# Patient Record
Sex: Male | Born: 1970 | Race: White | Hispanic: No | Marital: Single | Smoking: Current every day smoker
Health system: Southern US, Community
[De-identification: ages and names within clinical notes are randomized; demographics above are authoritative.]

## PROBLEM LIST (undated history)

## (undated) DIAGNOSIS — R0602 Shortness of breath: Secondary | ICD-10-CM

## (undated) DIAGNOSIS — M797 Fibromyalgia: Secondary | ICD-10-CM

## (undated) DIAGNOSIS — K219 Gastro-esophageal reflux disease without esophagitis: Secondary | ICD-10-CM

## (undated) DIAGNOSIS — R569 Unspecified convulsions: Secondary | ICD-10-CM

## (undated) DIAGNOSIS — F32A Depression, unspecified: Secondary | ICD-10-CM

## (undated) DIAGNOSIS — F329 Major depressive disorder, single episode, unspecified: Secondary | ICD-10-CM

## (undated) DIAGNOSIS — G2581 Restless legs syndrome: Secondary | ICD-10-CM

## (undated) DIAGNOSIS — Z8661 Personal history of infections of the central nervous system: Secondary | ICD-10-CM

## (undated) DIAGNOSIS — F410 Panic disorder [episodic paroxysmal anxiety] without agoraphobia: Secondary | ICD-10-CM

## (undated) DIAGNOSIS — IMO0002 Reserved for concepts with insufficient information to code with codable children: Secondary | ICD-10-CM

## (undated) HISTORY — DX: Unspecified convulsions: R56.9

## (undated) HISTORY — DX: Reserved for concepts with insufficient information to code with codable children: IMO0002

## (undated) HISTORY — DX: Depression, unspecified: F32.A

## (undated) HISTORY — PX: OTHER SURGICAL HISTORY: SHX169

## (undated) HISTORY — DX: Gastro-esophageal reflux disease without esophagitis: K21.9

## (undated) HISTORY — DX: Personal history of infections of the central nervous system: Z86.61

## (undated) HISTORY — DX: Major depressive disorder, single episode, unspecified: F32.9

---

## 1997-12-13 DIAGNOSIS — R569 Unspecified convulsions: Secondary | ICD-10-CM

## 1997-12-13 HISTORY — DX: Unspecified convulsions: R56.9

## 1998-03-29 ENCOUNTER — Ambulatory Visit (HOSPITAL_COMMUNITY): Admission: RE | Admit: 1998-03-29 | Discharge: 1998-03-29 | Payer: Self-pay | Admitting: Neurosurgery

## 1998-04-16 ENCOUNTER — Encounter: Admission: RE | Admit: 1998-04-16 | Discharge: 1998-04-16 | Payer: Self-pay | Admitting: Infectious Diseases

## 1998-08-06 ENCOUNTER — Encounter: Admission: RE | Admit: 1998-08-06 | Discharge: 1998-08-06 | Payer: Self-pay | Admitting: Infectious Diseases

## 1998-09-24 ENCOUNTER — Inpatient Hospital Stay (HOSPITAL_COMMUNITY): Admission: EM | Admit: 1998-09-24 | Discharge: 1998-09-26 | Payer: Self-pay | Admitting: *Deleted

## 1998-09-30 ENCOUNTER — Encounter: Admission: RE | Admit: 1998-09-30 | Discharge: 1998-09-30 | Payer: Self-pay | Admitting: Family Medicine

## 1998-10-09 ENCOUNTER — Encounter: Admission: RE | Admit: 1998-10-09 | Discharge: 1998-10-09 | Payer: Self-pay | Admitting: Family Medicine

## 1998-10-23 ENCOUNTER — Encounter: Admission: RE | Admit: 1998-10-23 | Discharge: 1998-10-23 | Payer: Self-pay | Admitting: Family Medicine

## 1998-10-27 ENCOUNTER — Inpatient Hospital Stay (HOSPITAL_COMMUNITY): Admission: EM | Admit: 1998-10-27 | Discharge: 1998-10-29 | Payer: Self-pay | Admitting: Emergency Medicine

## 1998-10-28 ENCOUNTER — Encounter: Admission: RE | Admit: 1998-10-28 | Discharge: 1998-10-28 | Payer: Self-pay | Admitting: Sports Medicine

## 1998-11-18 ENCOUNTER — Encounter: Admission: RE | Admit: 1998-11-18 | Discharge: 1998-11-18 | Payer: Self-pay | Admitting: Sports Medicine

## 1998-12-13 DIAGNOSIS — Z8661 Personal history of infections of the central nervous system: Secondary | ICD-10-CM

## 1998-12-13 HISTORY — DX: Personal history of infections of the central nervous system: Z86.61

## 1998-12-13 HISTORY — PX: MASTOIDECTOMY: SHX711

## 1998-12-13 HISTORY — PX: BRAIN SURGERY: SHX531

## 1999-05-05 ENCOUNTER — Inpatient Hospital Stay (HOSPITAL_COMMUNITY): Admission: EM | Admit: 1999-05-05 | Discharge: 1999-05-06 | Payer: Self-pay | Admitting: Emergency Medicine

## 1999-05-05 ENCOUNTER — Encounter: Admission: RE | Admit: 1999-05-05 | Discharge: 1999-05-05 | Payer: Self-pay | Admitting: Sports Medicine

## 1999-05-25 ENCOUNTER — Encounter: Admission: RE | Admit: 1999-05-25 | Discharge: 1999-05-25 | Payer: Self-pay | Admitting: Family Medicine

## 1999-06-04 ENCOUNTER — Emergency Department (HOSPITAL_COMMUNITY): Admission: EM | Admit: 1999-06-04 | Discharge: 1999-06-04 | Payer: Self-pay | Admitting: Emergency Medicine

## 1999-06-04 ENCOUNTER — Encounter: Payer: Self-pay | Admitting: Emergency Medicine

## 1999-06-04 ENCOUNTER — Encounter: Admission: RE | Admit: 1999-06-04 | Discharge: 1999-06-04 | Payer: Self-pay | Admitting: Family Medicine

## 1999-06-05 ENCOUNTER — Inpatient Hospital Stay (HOSPITAL_COMMUNITY): Admission: EM | Admit: 1999-06-05 | Discharge: 1999-06-06 | Payer: Self-pay | Admitting: *Deleted

## 1999-10-11 ENCOUNTER — Emergency Department (HOSPITAL_COMMUNITY): Admission: EM | Admit: 1999-10-11 | Discharge: 1999-10-11 | Payer: Self-pay | Admitting: Emergency Medicine

## 2000-11-22 ENCOUNTER — Emergency Department (HOSPITAL_COMMUNITY): Admission: EM | Admit: 2000-11-22 | Discharge: 2000-11-22 | Payer: Self-pay | Admitting: Emergency Medicine

## 2000-11-22 ENCOUNTER — Encounter: Payer: Self-pay | Admitting: Emergency Medicine

## 2000-12-14 ENCOUNTER — Observation Stay (HOSPITAL_COMMUNITY): Admission: EM | Admit: 2000-12-14 | Discharge: 2000-12-14 | Payer: Self-pay | Admitting: Emergency Medicine

## 2000-12-14 ENCOUNTER — Encounter: Payer: Self-pay | Admitting: Emergency Medicine

## 2001-03-25 ENCOUNTER — Emergency Department (HOSPITAL_COMMUNITY): Admission: EM | Admit: 2001-03-25 | Discharge: 2001-03-25 | Payer: Self-pay | Admitting: Emergency Medicine

## 2001-03-25 ENCOUNTER — Encounter: Payer: Self-pay | Admitting: Emergency Medicine

## 2001-04-12 ENCOUNTER — Emergency Department (HOSPITAL_COMMUNITY): Admission: EM | Admit: 2001-04-12 | Discharge: 2001-04-12 | Payer: Self-pay | Admitting: Emergency Medicine

## 2001-05-02 ENCOUNTER — Emergency Department (HOSPITAL_COMMUNITY): Admission: EM | Admit: 2001-05-02 | Discharge: 2001-05-02 | Payer: Self-pay

## 2002-05-11 ENCOUNTER — Emergency Department (HOSPITAL_COMMUNITY): Admission: EM | Admit: 2002-05-11 | Discharge: 2002-05-11 | Payer: Self-pay | Admitting: *Deleted

## 2002-05-19 ENCOUNTER — Encounter: Payer: Self-pay | Admitting: Emergency Medicine

## 2002-05-19 ENCOUNTER — Emergency Department (HOSPITAL_COMMUNITY): Admission: EM | Admit: 2002-05-19 | Discharge: 2002-05-20 | Payer: Self-pay | Admitting: Emergency Medicine

## 2002-05-23 ENCOUNTER — Emergency Department (HOSPITAL_COMMUNITY): Admission: EM | Admit: 2002-05-23 | Discharge: 2002-05-23 | Payer: Self-pay | Admitting: Emergency Medicine

## 2002-05-27 ENCOUNTER — Encounter: Payer: Self-pay | Admitting: Emergency Medicine

## 2002-05-27 ENCOUNTER — Emergency Department (HOSPITAL_COMMUNITY): Admission: EM | Admit: 2002-05-27 | Discharge: 2002-05-27 | Payer: Self-pay

## 2002-06-08 ENCOUNTER — Inpatient Hospital Stay (HOSPITAL_COMMUNITY): Admission: EM | Admit: 2002-06-08 | Discharge: 2002-06-10 | Payer: Self-pay | Admitting: Psychiatry

## 2002-06-12 ENCOUNTER — Emergency Department (HOSPITAL_COMMUNITY): Admission: EM | Admit: 2002-06-12 | Discharge: 2002-06-12 | Payer: Self-pay | Admitting: Emergency Medicine

## 2002-07-04 ENCOUNTER — Encounter: Admission: RE | Admit: 2002-07-04 | Discharge: 2002-09-11 | Payer: Self-pay

## 2002-07-19 ENCOUNTER — Emergency Department (HOSPITAL_COMMUNITY): Admission: EM | Admit: 2002-07-19 | Discharge: 2002-07-19 | Payer: Self-pay | Admitting: Emergency Medicine

## 2002-07-28 ENCOUNTER — Emergency Department (HOSPITAL_COMMUNITY): Admission: EM | Admit: 2002-07-28 | Discharge: 2002-07-28 | Payer: Self-pay | Admitting: Emergency Medicine

## 2002-09-21 ENCOUNTER — Emergency Department (HOSPITAL_COMMUNITY): Admission: EM | Admit: 2002-09-21 | Discharge: 2002-09-21 | Payer: Self-pay | Admitting: Emergency Medicine

## 2002-09-25 ENCOUNTER — Encounter: Admission: RE | Admit: 2002-09-25 | Discharge: 2002-12-24 | Payer: Self-pay

## 2002-12-09 ENCOUNTER — Emergency Department (HOSPITAL_COMMUNITY): Admission: EM | Admit: 2002-12-09 | Discharge: 2002-12-09 | Payer: Self-pay | Admitting: Emergency Medicine

## 2003-01-04 ENCOUNTER — Emergency Department (HOSPITAL_COMMUNITY): Admission: EM | Admit: 2003-01-04 | Discharge: 2003-01-04 | Payer: Self-pay | Admitting: Emergency Medicine

## 2003-01-25 ENCOUNTER — Emergency Department (HOSPITAL_COMMUNITY): Admission: EM | Admit: 2003-01-25 | Discharge: 2003-01-25 | Payer: Self-pay | Admitting: Emergency Medicine

## 2003-06-02 ENCOUNTER — Emergency Department (HOSPITAL_COMMUNITY): Admission: EM | Admit: 2003-06-02 | Discharge: 2003-06-03 | Payer: Self-pay | Admitting: Emergency Medicine

## 2003-06-03 ENCOUNTER — Encounter: Payer: Self-pay | Admitting: Emergency Medicine

## 2003-06-05 ENCOUNTER — Encounter: Payer: Self-pay | Admitting: Emergency Medicine

## 2003-06-05 ENCOUNTER — Emergency Department (HOSPITAL_COMMUNITY): Admission: EM | Admit: 2003-06-05 | Discharge: 2003-06-05 | Payer: Self-pay | Admitting: Emergency Medicine

## 2003-06-06 ENCOUNTER — Emergency Department (HOSPITAL_COMMUNITY): Admission: EM | Admit: 2003-06-06 | Discharge: 2003-06-07 | Payer: Self-pay | Admitting: Emergency Medicine

## 2003-06-07 ENCOUNTER — Encounter: Payer: Self-pay | Admitting: Emergency Medicine

## 2003-06-11 ENCOUNTER — Encounter: Payer: Self-pay | Admitting: Emergency Medicine

## 2003-06-11 ENCOUNTER — Emergency Department (HOSPITAL_COMMUNITY): Admission: EM | Admit: 2003-06-11 | Discharge: 2003-06-11 | Payer: Self-pay | Admitting: Emergency Medicine

## 2003-06-20 ENCOUNTER — Emergency Department (HOSPITAL_COMMUNITY): Admission: EM | Admit: 2003-06-20 | Discharge: 2003-06-20 | Payer: Self-pay | Admitting: Emergency Medicine

## 2003-07-18 ENCOUNTER — Emergency Department (HOSPITAL_COMMUNITY): Admission: EM | Admit: 2003-07-18 | Discharge: 2003-07-18 | Payer: Self-pay | Admitting: Emergency Medicine

## 2005-02-08 ENCOUNTER — Emergency Department (HOSPITAL_COMMUNITY): Admission: EM | Admit: 2005-02-08 | Discharge: 2005-02-08 | Payer: Self-pay | Admitting: Emergency Medicine

## 2005-06-16 ENCOUNTER — Emergency Department (HOSPITAL_COMMUNITY): Admission: EM | Admit: 2005-06-16 | Discharge: 2005-06-16 | Payer: Self-pay | Admitting: Emergency Medicine

## 2005-07-09 ENCOUNTER — Emergency Department (HOSPITAL_COMMUNITY): Admission: EM | Admit: 2005-07-09 | Discharge: 2005-07-09 | Payer: Self-pay | Admitting: Family Medicine

## 2005-07-26 ENCOUNTER — Emergency Department (HOSPITAL_COMMUNITY): Admission: EM | Admit: 2005-07-26 | Discharge: 2005-07-26 | Payer: Self-pay | Admitting: Emergency Medicine

## 2006-05-27 ENCOUNTER — Emergency Department (HOSPITAL_COMMUNITY): Admission: EM | Admit: 2006-05-27 | Discharge: 2006-05-27 | Payer: Self-pay | Admitting: Emergency Medicine

## 2006-05-28 ENCOUNTER — Emergency Department (HOSPITAL_COMMUNITY): Admission: EM | Admit: 2006-05-28 | Discharge: 2006-05-28 | Payer: Self-pay | Admitting: Emergency Medicine

## 2006-07-17 ENCOUNTER — Emergency Department (HOSPITAL_COMMUNITY): Admission: EM | Admit: 2006-07-17 | Discharge: 2006-07-17 | Payer: Self-pay | Admitting: *Deleted

## 2006-07-25 ENCOUNTER — Emergency Department (HOSPITAL_COMMUNITY): Admission: EM | Admit: 2006-07-25 | Discharge: 2006-07-25 | Payer: Self-pay | Admitting: Emergency Medicine

## 2006-08-05 ENCOUNTER — Emergency Department (HOSPITAL_COMMUNITY): Admission: EM | Admit: 2006-08-05 | Discharge: 2006-08-05 | Payer: Self-pay | Admitting: Emergency Medicine

## 2006-08-15 ENCOUNTER — Emergency Department (HOSPITAL_COMMUNITY): Admission: EM | Admit: 2006-08-15 | Discharge: 2006-08-15 | Payer: Self-pay | Admitting: Emergency Medicine

## 2006-08-29 ENCOUNTER — Emergency Department (HOSPITAL_COMMUNITY): Admission: EM | Admit: 2006-08-29 | Discharge: 2006-08-29 | Payer: Self-pay | Admitting: Emergency Medicine

## 2006-09-05 ENCOUNTER — Emergency Department (HOSPITAL_COMMUNITY): Admission: EM | Admit: 2006-09-05 | Discharge: 2006-09-05 | Payer: Self-pay | Admitting: Emergency Medicine

## 2006-09-12 ENCOUNTER — Emergency Department (HOSPITAL_COMMUNITY): Admission: EM | Admit: 2006-09-12 | Discharge: 2006-09-12 | Payer: Self-pay | Admitting: Family Medicine

## 2006-09-12 ENCOUNTER — Emergency Department (HOSPITAL_COMMUNITY): Admission: EM | Admit: 2006-09-12 | Discharge: 2006-09-12 | Payer: Self-pay | Admitting: Emergency Medicine

## 2006-09-21 ENCOUNTER — Emergency Department (HOSPITAL_COMMUNITY): Admission: EM | Admit: 2006-09-21 | Discharge: 2006-09-21 | Payer: Self-pay | Admitting: Emergency Medicine

## 2006-09-28 ENCOUNTER — Emergency Department (HOSPITAL_COMMUNITY): Admission: EM | Admit: 2006-09-28 | Discharge: 2006-09-28 | Payer: Self-pay | Admitting: Emergency Medicine

## 2006-10-01 ENCOUNTER — Emergency Department (HOSPITAL_COMMUNITY): Admission: EM | Admit: 2006-10-01 | Discharge: 2006-10-01 | Payer: Self-pay | Admitting: Emergency Medicine

## 2006-10-04 ENCOUNTER — Encounter: Admission: RE | Admit: 2006-10-04 | Discharge: 2006-10-04 | Payer: Self-pay | Admitting: Family Medicine

## 2006-11-24 ENCOUNTER — Emergency Department (HOSPITAL_COMMUNITY): Admission: EM | Admit: 2006-11-24 | Discharge: 2006-11-24 | Payer: Self-pay | Admitting: Emergency Medicine

## 2006-11-24 ENCOUNTER — Emergency Department (HOSPITAL_COMMUNITY): Admission: EM | Admit: 2006-11-24 | Discharge: 2006-11-25 | Payer: Self-pay | Admitting: Emergency Medicine

## 2006-11-25 ENCOUNTER — Emergency Department (HOSPITAL_COMMUNITY): Admission: EM | Admit: 2006-11-25 | Discharge: 2006-11-25 | Payer: Self-pay | Admitting: Emergency Medicine

## 2006-12-08 ENCOUNTER — Emergency Department (HOSPITAL_COMMUNITY): Admission: EM | Admit: 2006-12-08 | Discharge: 2006-12-08 | Payer: Self-pay | Admitting: Emergency Medicine

## 2006-12-11 ENCOUNTER — Emergency Department (HOSPITAL_COMMUNITY): Admission: EM | Admit: 2006-12-11 | Discharge: 2006-12-11 | Payer: Self-pay | Admitting: Emergency Medicine

## 2006-12-29 ENCOUNTER — Emergency Department (HOSPITAL_COMMUNITY): Admission: EM | Admit: 2006-12-29 | Discharge: 2006-12-29 | Payer: Self-pay | Admitting: Emergency Medicine

## 2007-01-08 ENCOUNTER — Emergency Department (HOSPITAL_COMMUNITY): Admission: EM | Admit: 2007-01-08 | Discharge: 2007-01-08 | Payer: Self-pay | Admitting: Emergency Medicine

## 2007-01-11 ENCOUNTER — Emergency Department (HOSPITAL_COMMUNITY): Admission: EM | Admit: 2007-01-11 | Discharge: 2007-01-11 | Payer: Self-pay | Admitting: Emergency Medicine

## 2007-01-26 ENCOUNTER — Emergency Department (HOSPITAL_COMMUNITY): Admission: EM | Admit: 2007-01-26 | Discharge: 2007-01-26 | Payer: Self-pay | Admitting: Emergency Medicine

## 2007-01-28 ENCOUNTER — Emergency Department (HOSPITAL_COMMUNITY): Admission: EM | Admit: 2007-01-28 | Discharge: 2007-01-28 | Payer: Self-pay | Admitting: Emergency Medicine

## 2007-01-29 ENCOUNTER — Emergency Department (HOSPITAL_COMMUNITY): Admission: EM | Admit: 2007-01-29 | Discharge: 2007-01-29 | Payer: Self-pay | Admitting: Emergency Medicine

## 2007-02-09 ENCOUNTER — Emergency Department (HOSPITAL_COMMUNITY): Admission: EM | Admit: 2007-02-09 | Discharge: 2007-02-09 | Payer: Self-pay | Admitting: Emergency Medicine

## 2007-02-23 DIAGNOSIS — R569 Unspecified convulsions: Secondary | ICD-10-CM | POA: Insufficient documentation

## 2007-02-23 DIAGNOSIS — L02419 Cutaneous abscess of limb, unspecified: Secondary | ICD-10-CM | POA: Insufficient documentation

## 2007-02-23 DIAGNOSIS — L03119 Cellulitis of unspecified part of limb: Secondary | ICD-10-CM

## 2007-02-23 DIAGNOSIS — F411 Generalized anxiety disorder: Secondary | ICD-10-CM | POA: Insufficient documentation

## 2007-02-23 DIAGNOSIS — Z8669 Personal history of other diseases of the nervous system and sense organs: Secondary | ICD-10-CM

## 2007-02-23 DIAGNOSIS — B957 Other staphylococcus as the cause of diseases classified elsewhere: Secondary | ICD-10-CM | POA: Insufficient documentation

## 2007-02-26 ENCOUNTER — Emergency Department (HOSPITAL_COMMUNITY): Admission: EM | Admit: 2007-02-26 | Discharge: 2007-02-26 | Payer: Self-pay | Admitting: Emergency Medicine

## 2007-02-28 ENCOUNTER — Ambulatory Visit: Payer: Self-pay | Admitting: Infectious Diseases

## 2007-02-28 DIAGNOSIS — F988 Other specified behavioral and emotional disorders with onset usually occurring in childhood and adolescence: Secondary | ICD-10-CM | POA: Insufficient documentation

## 2007-03-07 ENCOUNTER — Telehealth (INDEPENDENT_AMBULATORY_CARE_PROVIDER_SITE_OTHER): Payer: Self-pay | Admitting: Infectious Diseases

## 2007-03-30 ENCOUNTER — Emergency Department (HOSPITAL_COMMUNITY): Admission: EM | Admit: 2007-03-30 | Discharge: 2007-03-30 | Payer: Self-pay | Admitting: Emergency Medicine

## 2007-04-22 ENCOUNTER — Emergency Department (HOSPITAL_COMMUNITY): Admission: EM | Admit: 2007-04-22 | Discharge: 2007-04-23 | Payer: Self-pay | Admitting: Family Medicine

## 2007-04-23 ENCOUNTER — Emergency Department (HOSPITAL_COMMUNITY): Admission: EM | Admit: 2007-04-23 | Discharge: 2007-04-23 | Payer: Self-pay | Admitting: Emergency Medicine

## 2007-04-24 ENCOUNTER — Emergency Department (HOSPITAL_COMMUNITY): Admission: EM | Admit: 2007-04-24 | Discharge: 2007-04-24 | Payer: Self-pay | Admitting: Emergency Medicine

## 2007-04-27 ENCOUNTER — Emergency Department (HOSPITAL_COMMUNITY): Admission: EM | Admit: 2007-04-27 | Discharge: 2007-04-27 | Payer: Self-pay | Admitting: Emergency Medicine

## 2007-05-29 ENCOUNTER — Emergency Department (HOSPITAL_COMMUNITY): Admission: EM | Admit: 2007-05-29 | Discharge: 2007-05-29 | Payer: Self-pay | Admitting: Emergency Medicine

## 2007-06-09 ENCOUNTER — Emergency Department (HOSPITAL_COMMUNITY): Admission: EM | Admit: 2007-06-09 | Discharge: 2007-06-09 | Payer: Self-pay | Admitting: Emergency Medicine

## 2007-06-17 ENCOUNTER — Emergency Department (HOSPITAL_COMMUNITY): Admission: EM | Admit: 2007-06-17 | Discharge: 2007-06-17 | Payer: Self-pay | Admitting: Emergency Medicine

## 2007-08-01 ENCOUNTER — Emergency Department (HOSPITAL_COMMUNITY): Admission: EM | Admit: 2007-08-01 | Discharge: 2007-08-02 | Payer: Self-pay | Admitting: Emergency Medicine

## 2007-08-06 ENCOUNTER — Emergency Department (HOSPITAL_COMMUNITY): Admission: EM | Admit: 2007-08-06 | Discharge: 2007-08-06 | Payer: Self-pay | Admitting: Emergency Medicine

## 2007-08-07 ENCOUNTER — Emergency Department (HOSPITAL_COMMUNITY): Admission: EM | Admit: 2007-08-07 | Discharge: 2007-08-07 | Payer: Self-pay | Admitting: Family Medicine

## 2007-08-09 ENCOUNTER — Telehealth: Payer: Self-pay | Admitting: Internal Medicine

## 2007-08-11 ENCOUNTER — Telehealth: Payer: Self-pay

## 2007-08-14 ENCOUNTER — Emergency Department (HOSPITAL_COMMUNITY): Admission: EM | Admit: 2007-08-14 | Discharge: 2007-08-14 | Payer: Self-pay | Admitting: Emergency Medicine

## 2007-09-11 ENCOUNTER — Emergency Department (HOSPITAL_COMMUNITY): Admission: EM | Admit: 2007-09-11 | Discharge: 2007-09-11 | Payer: Self-pay | Admitting: Emergency Medicine

## 2007-09-27 ENCOUNTER — Emergency Department (HOSPITAL_COMMUNITY): Admission: EM | Admit: 2007-09-27 | Discharge: 2007-09-27 | Payer: Self-pay | Admitting: Emergency Medicine

## 2007-10-01 ENCOUNTER — Emergency Department (HOSPITAL_COMMUNITY): Admission: EM | Admit: 2007-10-01 | Discharge: 2007-10-01 | Payer: Self-pay | Admitting: *Deleted

## 2007-10-12 ENCOUNTER — Encounter: Admission: RE | Admit: 2007-10-12 | Discharge: 2007-11-15 | Payer: Self-pay | Admitting: Anesthesiology

## 2007-11-28 ENCOUNTER — Emergency Department (HOSPITAL_COMMUNITY): Admission: EM | Admit: 2007-11-28 | Discharge: 2007-11-28 | Payer: Self-pay | Admitting: Emergency Medicine

## 2007-12-10 ENCOUNTER — Emergency Department (HOSPITAL_COMMUNITY): Admission: EM | Admit: 2007-12-10 | Discharge: 2007-12-10 | Payer: Self-pay | Admitting: Emergency Medicine

## 2008-01-02 ENCOUNTER — Emergency Department (HOSPITAL_COMMUNITY): Admission: EM | Admit: 2008-01-02 | Discharge: 2008-01-02 | Payer: Self-pay | Admitting: Emergency Medicine

## 2008-01-04 ENCOUNTER — Emergency Department (HOSPITAL_COMMUNITY): Admission: EM | Admit: 2008-01-04 | Discharge: 2008-01-04 | Payer: Self-pay | Admitting: Emergency Medicine

## 2008-01-26 ENCOUNTER — Emergency Department (HOSPITAL_COMMUNITY): Admission: EM | Admit: 2008-01-26 | Discharge: 2008-01-26 | Payer: Self-pay | Admitting: Emergency Medicine

## 2008-01-26 ENCOUNTER — Other Ambulatory Visit: Payer: Self-pay | Admitting: Emergency Medicine

## 2008-01-29 ENCOUNTER — Emergency Department (HOSPITAL_COMMUNITY): Admission: EM | Admit: 2008-01-29 | Discharge: 2008-01-29 | Payer: Self-pay | Admitting: Emergency Medicine

## 2008-02-20 ENCOUNTER — Emergency Department (HOSPITAL_COMMUNITY): Admission: EM | Admit: 2008-02-20 | Discharge: 2008-02-20 | Payer: Self-pay | Admitting: Emergency Medicine

## 2008-02-22 ENCOUNTER — Emergency Department (HOSPITAL_COMMUNITY): Admission: EM | Admit: 2008-02-22 | Discharge: 2008-02-22 | Payer: Self-pay | Admitting: Emergency Medicine

## 2008-02-23 ENCOUNTER — Emergency Department (HOSPITAL_COMMUNITY): Admission: EM | Admit: 2008-02-23 | Discharge: 2008-02-23 | Payer: Self-pay | Admitting: Emergency Medicine

## 2008-02-27 ENCOUNTER — Emergency Department (HOSPITAL_COMMUNITY): Admission: EM | Admit: 2008-02-27 | Discharge: 2008-02-27 | Payer: Self-pay | Admitting: Family Medicine

## 2008-02-27 ENCOUNTER — Emergency Department (HOSPITAL_COMMUNITY): Admission: EM | Admit: 2008-02-27 | Discharge: 2008-02-27 | Payer: Self-pay | Admitting: Emergency Medicine

## 2008-03-03 ENCOUNTER — Emergency Department (HOSPITAL_COMMUNITY): Admission: EM | Admit: 2008-03-03 | Discharge: 2008-03-03 | Payer: Self-pay | Admitting: Emergency Medicine

## 2008-03-04 ENCOUNTER — Encounter (INDEPENDENT_AMBULATORY_CARE_PROVIDER_SITE_OTHER): Payer: Self-pay | Admitting: *Deleted

## 2008-03-05 ENCOUNTER — Emergency Department (HOSPITAL_COMMUNITY): Admission: EM | Admit: 2008-03-05 | Discharge: 2008-03-05 | Payer: Self-pay | Admitting: Emergency Medicine

## 2008-03-07 ENCOUNTER — Emergency Department (HOSPITAL_COMMUNITY): Admission: EM | Admit: 2008-03-07 | Discharge: 2008-03-07 | Payer: Self-pay | Admitting: Emergency Medicine

## 2008-03-19 ENCOUNTER — Emergency Department (HOSPITAL_COMMUNITY): Admission: EM | Admit: 2008-03-19 | Discharge: 2008-03-19 | Payer: Self-pay | Admitting: Emergency Medicine

## 2008-05-11 ENCOUNTER — Emergency Department (HOSPITAL_COMMUNITY): Admission: EM | Admit: 2008-05-11 | Discharge: 2008-05-11 | Payer: Self-pay | Admitting: Emergency Medicine

## 2008-05-22 ENCOUNTER — Emergency Department (HOSPITAL_COMMUNITY): Admission: EM | Admit: 2008-05-22 | Discharge: 2008-05-22 | Payer: Self-pay | Admitting: Emergency Medicine

## 2008-06-05 ENCOUNTER — Ambulatory Visit (HOSPITAL_COMMUNITY): Payer: Self-pay | Admitting: Psychiatry

## 2008-06-10 ENCOUNTER — Emergency Department (HOSPITAL_COMMUNITY): Admission: EM | Admit: 2008-06-10 | Discharge: 2008-06-10 | Payer: Self-pay | Admitting: Emergency Medicine

## 2008-06-17 ENCOUNTER — Emergency Department (HOSPITAL_COMMUNITY): Admission: EM | Admit: 2008-06-17 | Discharge: 2008-06-18 | Payer: Self-pay | Admitting: Emergency Medicine

## 2008-06-28 ENCOUNTER — Ambulatory Visit (HOSPITAL_COMMUNITY): Payer: Self-pay | Admitting: Psychiatry

## 2008-07-01 ENCOUNTER — Ambulatory Visit (HOSPITAL_COMMUNITY): Payer: Self-pay | Admitting: Psychiatry

## 2008-07-03 ENCOUNTER — Emergency Department (HOSPITAL_COMMUNITY): Admission: EM | Admit: 2008-07-03 | Discharge: 2008-07-03 | Payer: Self-pay | Admitting: Emergency Medicine

## 2008-07-06 ENCOUNTER — Emergency Department (HOSPITAL_COMMUNITY): Admission: EM | Admit: 2008-07-06 | Discharge: 2008-07-06 | Payer: Self-pay | Admitting: Emergency Medicine

## 2008-07-21 ENCOUNTER — Emergency Department (HOSPITAL_COMMUNITY): Admission: EM | Admit: 2008-07-21 | Discharge: 2008-07-21 | Payer: Self-pay | Admitting: Emergency Medicine

## 2008-07-21 ENCOUNTER — Emergency Department (HOSPITAL_COMMUNITY): Admission: EM | Admit: 2008-07-21 | Discharge: 2008-07-21 | Payer: Self-pay | Admitting: Family Medicine

## 2008-07-29 ENCOUNTER — Emergency Department (HOSPITAL_COMMUNITY): Admission: EM | Admit: 2008-07-29 | Discharge: 2008-07-29 | Payer: Self-pay | Admitting: Emergency Medicine

## 2008-07-30 ENCOUNTER — Emergency Department (HOSPITAL_COMMUNITY): Admission: EM | Admit: 2008-07-30 | Discharge: 2008-07-30 | Payer: Self-pay | Admitting: Emergency Medicine

## 2008-12-10 ENCOUNTER — Emergency Department (HOSPITAL_COMMUNITY): Admission: EM | Admit: 2008-12-10 | Discharge: 2008-12-10 | Payer: Self-pay | Admitting: Emergency Medicine

## 2008-12-17 ENCOUNTER — Emergency Department (HOSPITAL_COMMUNITY): Admission: EM | Admit: 2008-12-17 | Discharge: 2008-12-17 | Payer: Self-pay | Admitting: Emergency Medicine

## 2008-12-23 ENCOUNTER — Emergency Department (HOSPITAL_COMMUNITY): Admission: EM | Admit: 2008-12-23 | Discharge: 2008-12-23 | Payer: Self-pay | Admitting: Emergency Medicine

## 2008-12-24 ENCOUNTER — Emergency Department (HOSPITAL_COMMUNITY): Admission: EM | Admit: 2008-12-24 | Discharge: 2008-12-24 | Payer: Self-pay | Admitting: Emergency Medicine

## 2008-12-28 ENCOUNTER — Emergency Department (HOSPITAL_COMMUNITY): Admission: EM | Admit: 2008-12-28 | Discharge: 2008-12-28 | Payer: Self-pay | Admitting: Emergency Medicine

## 2008-12-29 ENCOUNTER — Emergency Department (HOSPITAL_COMMUNITY): Admission: EM | Admit: 2008-12-29 | Discharge: 2008-12-29 | Payer: Self-pay | Admitting: Emergency Medicine

## 2009-01-13 ENCOUNTER — Emergency Department (HOSPITAL_COMMUNITY): Admission: EM | Admit: 2009-01-13 | Discharge: 2009-01-13 | Payer: Self-pay | Admitting: Emergency Medicine

## 2009-02-19 ENCOUNTER — Emergency Department (HOSPITAL_COMMUNITY): Admission: EM | Admit: 2009-02-19 | Discharge: 2009-02-19 | Payer: Self-pay | Admitting: Emergency Medicine

## 2009-03-01 ENCOUNTER — Emergency Department (HOSPITAL_COMMUNITY): Admission: EM | Admit: 2009-03-01 | Discharge: 2009-03-01 | Payer: Self-pay | Admitting: Emergency Medicine

## 2009-03-04 ENCOUNTER — Emergency Department (HOSPITAL_COMMUNITY): Admission: EM | Admit: 2009-03-04 | Discharge: 2009-03-04 | Payer: Self-pay | Admitting: Emergency Medicine

## 2009-04-14 ENCOUNTER — Emergency Department (HOSPITAL_COMMUNITY): Admission: EM | Admit: 2009-04-14 | Discharge: 2009-04-14 | Payer: Self-pay | Admitting: Emergency Medicine

## 2009-05-24 ENCOUNTER — Emergency Department (HOSPITAL_COMMUNITY): Admission: EM | Admit: 2009-05-24 | Discharge: 2009-05-24 | Payer: Self-pay | Admitting: Emergency Medicine

## 2009-05-29 ENCOUNTER — Emergency Department (HOSPITAL_COMMUNITY): Admission: EM | Admit: 2009-05-29 | Discharge: 2009-05-29 | Payer: Self-pay | Admitting: Emergency Medicine

## 2009-06-04 ENCOUNTER — Emergency Department (HOSPITAL_COMMUNITY): Admission: EM | Admit: 2009-06-04 | Discharge: 2009-06-04 | Payer: Self-pay | Admitting: Emergency Medicine

## 2009-06-05 ENCOUNTER — Telehealth (INDEPENDENT_AMBULATORY_CARE_PROVIDER_SITE_OTHER): Payer: Self-pay | Admitting: Licensed Clinical Social Worker

## 2009-06-20 ENCOUNTER — Emergency Department (HOSPITAL_COMMUNITY): Admission: EM | Admit: 2009-06-20 | Discharge: 2009-06-20 | Payer: Self-pay | Admitting: Emergency Medicine

## 2009-06-24 ENCOUNTER — Emergency Department (HOSPITAL_COMMUNITY): Admission: EM | Admit: 2009-06-24 | Discharge: 2009-06-24 | Payer: Self-pay | Admitting: Emergency Medicine

## 2009-06-29 ENCOUNTER — Emergency Department (HOSPITAL_COMMUNITY): Admission: EM | Admit: 2009-06-29 | Discharge: 2009-06-29 | Payer: Self-pay | Admitting: Emergency Medicine

## 2009-07-10 ENCOUNTER — Emergency Department (HOSPITAL_COMMUNITY): Admission: EM | Admit: 2009-07-10 | Discharge: 2009-07-10 | Payer: Self-pay | Admitting: Emergency Medicine

## 2009-07-14 ENCOUNTER — Other Ambulatory Visit: Payer: Self-pay | Admitting: Emergency Medicine

## 2009-07-14 ENCOUNTER — Inpatient Hospital Stay (HOSPITAL_COMMUNITY): Admission: RE | Admit: 2009-07-14 | Discharge: 2009-07-15 | Payer: Self-pay | Admitting: *Deleted

## 2009-07-14 ENCOUNTER — Ambulatory Visit: Payer: Self-pay | Admitting: Psychiatry

## 2009-07-16 ENCOUNTER — Emergency Department (HOSPITAL_COMMUNITY): Admission: EM | Admit: 2009-07-16 | Discharge: 2009-07-16 | Payer: Self-pay | Admitting: Emergency Medicine

## 2009-07-16 ENCOUNTER — Encounter: Payer: Self-pay | Admitting: Internal Medicine

## 2009-07-16 LAB — CONVERTED CEMR LAB
Calcium: 9.7 mg/dL
Chloride: 110 meq/L
Glucose, Bld: 128 mg/dL
Hemoglobin: 15.4 g/dL
Lymphocytes, automated: 29 %
MCV: 96.5 fL
Monocytes Relative: 8 %
Potassium: 3.7 meq/L
RDW: 14 %

## 2009-08-08 ENCOUNTER — Emergency Department (HOSPITAL_COMMUNITY): Admission: EM | Admit: 2009-08-08 | Discharge: 2009-08-08 | Payer: Self-pay | Admitting: Emergency Medicine

## 2009-08-09 ENCOUNTER — Emergency Department (HOSPITAL_COMMUNITY): Admission: EM | Admit: 2009-08-09 | Discharge: 2009-08-09 | Payer: Self-pay | Admitting: Emergency Medicine

## 2009-08-21 ENCOUNTER — Emergency Department (HOSPITAL_COMMUNITY): Admission: EM | Admit: 2009-08-21 | Discharge: 2009-08-21 | Payer: Self-pay | Admitting: Family Medicine

## 2009-08-24 ENCOUNTER — Emergency Department (HOSPITAL_COMMUNITY): Admission: EM | Admit: 2009-08-24 | Discharge: 2009-08-24 | Payer: Self-pay | Admitting: Emergency Medicine

## 2009-09-09 ENCOUNTER — Emergency Department (HOSPITAL_COMMUNITY): Admission: EM | Admit: 2009-09-09 | Discharge: 2009-09-09 | Payer: Self-pay | Admitting: Emergency Medicine

## 2009-09-11 ENCOUNTER — Emergency Department (HOSPITAL_COMMUNITY): Admission: EM | Admit: 2009-09-11 | Discharge: 2009-09-11 | Payer: Self-pay | Admitting: Emergency Medicine

## 2009-09-11 ENCOUNTER — Emergency Department (HOSPITAL_COMMUNITY): Admission: EM | Admit: 2009-09-11 | Discharge: 2009-09-11 | Payer: Self-pay | Admitting: Family Medicine

## 2009-09-15 ENCOUNTER — Emergency Department (HOSPITAL_COMMUNITY): Admission: EM | Admit: 2009-09-15 | Discharge: 2009-09-15 | Payer: Self-pay | Admitting: Emergency Medicine

## 2009-09-26 ENCOUNTER — Emergency Department (HOSPITAL_COMMUNITY): Admission: EM | Admit: 2009-09-26 | Discharge: 2009-09-26 | Payer: Self-pay | Admitting: Emergency Medicine

## 2009-09-29 ENCOUNTER — Emergency Department (HOSPITAL_COMMUNITY): Admission: EM | Admit: 2009-09-29 | Discharge: 2009-09-29 | Payer: Self-pay | Admitting: Emergency Medicine

## 2009-09-30 ENCOUNTER — Emergency Department (HOSPITAL_COMMUNITY): Admission: EM | Admit: 2009-09-30 | Discharge: 2009-09-30 | Payer: Self-pay | Admitting: Emergency Medicine

## 2009-09-30 ENCOUNTER — Emergency Department (HOSPITAL_COMMUNITY): Admission: EM | Admit: 2009-09-30 | Discharge: 2009-09-30 | Payer: Self-pay | Admitting: Family Medicine

## 2009-10-09 ENCOUNTER — Emergency Department (HOSPITAL_COMMUNITY): Admission: EM | Admit: 2009-10-09 | Discharge: 2009-10-09 | Payer: Self-pay | Admitting: Emergency Medicine

## 2009-10-12 ENCOUNTER — Emergency Department (HOSPITAL_COMMUNITY): Admission: EM | Admit: 2009-10-12 | Discharge: 2009-10-12 | Payer: Self-pay | Admitting: Emergency Medicine

## 2009-10-13 ENCOUNTER — Encounter: Payer: Self-pay | Admitting: Internal Medicine

## 2009-10-13 DIAGNOSIS — F17200 Nicotine dependence, unspecified, uncomplicated: Secondary | ICD-10-CM | POA: Insufficient documentation

## 2009-10-13 DIAGNOSIS — IMO0001 Reserved for inherently not codable concepts without codable children: Secondary | ICD-10-CM

## 2009-10-14 ENCOUNTER — Ambulatory Visit: Payer: Self-pay | Admitting: Internal Medicine

## 2009-10-28 ENCOUNTER — Telehealth: Payer: Self-pay | Admitting: Internal Medicine

## 2009-10-31 ENCOUNTER — Emergency Department (HOSPITAL_COMMUNITY): Admission: EM | Admit: 2009-10-31 | Discharge: 2009-10-31 | Payer: Self-pay | Admitting: Emergency Medicine

## 2009-11-02 ENCOUNTER — Emergency Department (HOSPITAL_COMMUNITY): Admission: EM | Admit: 2009-11-02 | Discharge: 2009-11-02 | Payer: Self-pay | Admitting: Emergency Medicine

## 2009-11-05 ENCOUNTER — Emergency Department (HOSPITAL_COMMUNITY): Admission: EM | Admit: 2009-11-05 | Discharge: 2009-11-05 | Payer: Self-pay | Admitting: Emergency Medicine

## 2009-11-09 ENCOUNTER — Emergency Department (HOSPITAL_COMMUNITY): Admission: EM | Admit: 2009-11-09 | Discharge: 2009-11-09 | Payer: Self-pay | Admitting: Emergency Medicine

## 2009-11-25 ENCOUNTER — Emergency Department (HOSPITAL_COMMUNITY): Admission: EM | Admit: 2009-11-25 | Discharge: 2009-11-25 | Payer: Self-pay | Admitting: Emergency Medicine

## 2009-11-26 ENCOUNTER — Emergency Department (HOSPITAL_COMMUNITY): Admission: EM | Admit: 2009-11-26 | Discharge: 2009-11-26 | Payer: Self-pay | Admitting: Emergency Medicine

## 2009-11-28 ENCOUNTER — Emergency Department (HOSPITAL_COMMUNITY): Admission: EM | Admit: 2009-11-28 | Discharge: 2009-11-28 | Payer: Self-pay | Admitting: Emergency Medicine

## 2009-12-01 ENCOUNTER — Emergency Department (HOSPITAL_COMMUNITY): Admission: EM | Admit: 2009-12-01 | Discharge: 2009-12-01 | Payer: Self-pay | Admitting: Emergency Medicine

## 2009-12-16 ENCOUNTER — Emergency Department (HOSPITAL_COMMUNITY): Admission: EM | Admit: 2009-12-16 | Discharge: 2009-12-16 | Payer: Self-pay | Admitting: Emergency Medicine

## 2009-12-24 ENCOUNTER — Emergency Department (HOSPITAL_COMMUNITY): Admission: EM | Admit: 2009-12-24 | Discharge: 2009-12-24 | Payer: Self-pay | Admitting: Emergency Medicine

## 2010-01-10 ENCOUNTER — Emergency Department (HOSPITAL_COMMUNITY): Admission: EM | Admit: 2010-01-10 | Discharge: 2010-01-10 | Payer: Self-pay | Admitting: Emergency Medicine

## 2010-02-12 ENCOUNTER — Emergency Department (HOSPITAL_COMMUNITY): Admission: EM | Admit: 2010-02-12 | Discharge: 2010-02-12 | Payer: Self-pay | Admitting: Emergency Medicine

## 2010-02-15 ENCOUNTER — Emergency Department (HOSPITAL_COMMUNITY): Admission: EM | Admit: 2010-02-15 | Discharge: 2010-02-15 | Payer: Self-pay | Admitting: Emergency Medicine

## 2010-02-16 ENCOUNTER — Emergency Department (HOSPITAL_COMMUNITY): Admission: EM | Admit: 2010-02-16 | Discharge: 2010-02-16 | Payer: Self-pay | Admitting: Emergency Medicine

## 2010-04-12 ENCOUNTER — Emergency Department (HOSPITAL_COMMUNITY): Admission: EM | Admit: 2010-04-12 | Discharge: 2010-04-12 | Payer: Self-pay | Admitting: Emergency Medicine

## 2010-04-13 ENCOUNTER — Emergency Department (HOSPITAL_COMMUNITY): Admission: EM | Admit: 2010-04-13 | Discharge: 2010-04-13 | Payer: Self-pay | Admitting: Emergency Medicine

## 2010-04-14 ENCOUNTER — Telehealth: Payer: Self-pay | Admitting: Internal Medicine

## 2010-04-16 ENCOUNTER — Emergency Department (HOSPITAL_COMMUNITY): Admission: EM | Admit: 2010-04-16 | Discharge: 2010-04-16 | Payer: Self-pay | Admitting: Emergency Medicine

## 2010-04-18 ENCOUNTER — Emergency Department (HOSPITAL_COMMUNITY): Admission: EM | Admit: 2010-04-18 | Discharge: 2010-04-18 | Payer: Self-pay | Admitting: Emergency Medicine

## 2010-05-02 ENCOUNTER — Emergency Department (HOSPITAL_COMMUNITY): Admission: EM | Admit: 2010-05-02 | Discharge: 2010-05-02 | Payer: Self-pay | Admitting: Emergency Medicine

## 2010-05-07 ENCOUNTER — Emergency Department (HOSPITAL_COMMUNITY): Admission: EM | Admit: 2010-05-07 | Discharge: 2010-05-07 | Payer: Self-pay | Admitting: Emergency Medicine

## 2010-05-16 ENCOUNTER — Emergency Department (HOSPITAL_COMMUNITY): Admission: EM | Admit: 2010-05-16 | Discharge: 2010-05-16 | Payer: Self-pay | Admitting: Emergency Medicine

## 2010-05-19 ENCOUNTER — Emergency Department (HOSPITAL_COMMUNITY): Admission: EM | Admit: 2010-05-19 | Discharge: 2010-05-19 | Payer: Self-pay | Admitting: Emergency Medicine

## 2010-05-31 ENCOUNTER — Emergency Department (HOSPITAL_COMMUNITY): Admission: EM | Admit: 2010-05-31 | Discharge: 2010-05-31 | Payer: Self-pay | Admitting: Emergency Medicine

## 2010-06-14 ENCOUNTER — Emergency Department (HOSPITAL_COMMUNITY): Admission: EM | Admit: 2010-06-14 | Discharge: 2010-06-14 | Payer: Self-pay | Admitting: Emergency Medicine

## 2010-06-24 ENCOUNTER — Emergency Department (HOSPITAL_COMMUNITY): Admission: EM | Admit: 2010-06-24 | Discharge: 2010-06-24 | Payer: Self-pay | Admitting: Emergency Medicine

## 2010-07-24 ENCOUNTER — Emergency Department (HOSPITAL_COMMUNITY): Admission: EM | Admit: 2010-07-24 | Discharge: 2010-07-24 | Payer: Self-pay | Admitting: Emergency Medicine

## 2010-08-19 ENCOUNTER — Emergency Department (HOSPITAL_COMMUNITY): Admission: EM | Admit: 2010-08-19 | Discharge: 2010-08-19 | Payer: Self-pay | Admitting: Emergency Medicine

## 2010-08-30 ENCOUNTER — Emergency Department (HOSPITAL_COMMUNITY): Admission: EM | Admit: 2010-08-30 | Discharge: 2010-08-30 | Payer: Self-pay | Admitting: Emergency Medicine

## 2010-09-01 ENCOUNTER — Other Ambulatory Visit: Payer: Self-pay | Admitting: Emergency Medicine

## 2010-09-01 ENCOUNTER — Ambulatory Visit: Payer: Self-pay | Admitting: Psychiatry

## 2010-09-01 ENCOUNTER — Inpatient Hospital Stay (HOSPITAL_COMMUNITY): Admission: AD | Admit: 2010-09-01 | Discharge: 2010-09-02 | Payer: Self-pay | Admitting: Psychiatry

## 2010-09-13 ENCOUNTER — Emergency Department (HOSPITAL_COMMUNITY): Admission: EM | Admit: 2010-09-13 | Discharge: 2010-09-13 | Payer: Self-pay | Admitting: Emergency Medicine

## 2010-10-16 ENCOUNTER — Emergency Department (HOSPITAL_COMMUNITY): Admission: EM | Admit: 2010-10-16 | Discharge: 2010-10-17 | Payer: Self-pay | Admitting: Emergency Medicine

## 2010-10-24 ENCOUNTER — Emergency Department (HOSPITAL_COMMUNITY): Admission: EM | Admit: 2010-10-24 | Discharge: 2010-10-24 | Payer: Self-pay | Admitting: Emergency Medicine

## 2010-10-26 ENCOUNTER — Emergency Department (HOSPITAL_COMMUNITY): Admission: EM | Admit: 2010-10-26 | Discharge: 2010-10-26 | Payer: Self-pay | Admitting: Emergency Medicine

## 2010-10-31 ENCOUNTER — Emergency Department (HOSPITAL_COMMUNITY): Admission: EM | Admit: 2010-10-31 | Discharge: 2010-10-31 | Payer: Self-pay | Admitting: Emergency Medicine

## 2010-11-24 ENCOUNTER — Emergency Department (HOSPITAL_COMMUNITY)
Admission: EM | Admit: 2010-11-24 | Discharge: 2010-11-24 | Payer: Self-pay | Source: Home / Self Care | Admitting: Emergency Medicine

## 2010-12-12 ENCOUNTER — Inpatient Hospital Stay (HOSPITAL_COMMUNITY): Admission: EM | Admit: 2010-12-12 | Discharge: 2010-12-13 | Payer: Self-pay | Source: Home / Self Care

## 2010-12-14 ENCOUNTER — Emergency Department (HOSPITAL_COMMUNITY)
Admission: EM | Admit: 2010-12-14 | Discharge: 2010-12-14 | Payer: Self-pay | Source: Home / Self Care | Admitting: Family Medicine

## 2010-12-17 ENCOUNTER — Emergency Department (HOSPITAL_COMMUNITY)
Admission: EM | Admit: 2010-12-17 | Discharge: 2010-12-17 | Payer: Self-pay | Source: Home / Self Care | Admitting: Emergency Medicine

## 2011-01-03 ENCOUNTER — Encounter: Payer: Self-pay | Admitting: Physical Medicine and Rehabilitation
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
## 2011-01-12 NOTE — Progress Notes (Signed)
Summary: Changing primary care physicians  Phone Note Call from Patient Call back at Home Phone [phone]   Caller: Patient Summary of Call: Pt wants to establish care with Dr Artist Pais to manage seizures and pain management for fibromyalgia, pt has Medicare & Medicaid. Pls advise. Initial call taken by: Lannette Donath,  Apr 14, 2010 10:45 AM  Follow-up for Phone Call        no, I will not accept pt Follow-up by: D. Thomos Lemons DO,  Apr 14, 2010 1:26 PM  Additional Follow-up for Phone Call Additional follow up Details #1::        LM for pt to [facility] Diane Tomerlin  Apr 14, 2010 1:50 PM

## 2011-01-15 ENCOUNTER — Emergency Department (HOSPITAL_COMMUNITY)
Admission: EM | Admit: 2011-01-15 | Discharge: 2011-01-15 | Disposition: A | Discharge status: Home / Self Care | Payer: Medicare Other | Attending: Emergency Medicine | Admitting: Emergency Medicine

## 2011-01-15 DIAGNOSIS — M545 Low back pain, unspecified: Secondary | ICD-10-CM | POA: Insufficient documentation

## 2011-01-15 DIAGNOSIS — F329 Major depressive disorder, single episode, unspecified: Secondary | ICD-10-CM | POA: Insufficient documentation

## 2011-01-15 DIAGNOSIS — F3289 Other specified depressive episodes: Secondary | ICD-10-CM | POA: Insufficient documentation

## 2011-01-15 DIAGNOSIS — Z79899 Other long term (current) drug therapy: Secondary | ICD-10-CM | POA: Insufficient documentation

## 2011-01-15 DIAGNOSIS — F988 Other specified behavioral and emotional disorders with onset usually occurring in childhood and adolescence: Secondary | ICD-10-CM | POA: Insufficient documentation

## 2011-01-15 DIAGNOSIS — M542 Cervicalgia: Secondary | ICD-10-CM | POA: Insufficient documentation

## 2011-01-15 DIAGNOSIS — Z760 Encounter for issue of repeat prescription: Secondary | ICD-10-CM | POA: Insufficient documentation

## 2011-01-15 DIAGNOSIS — R51 Headache: Secondary | ICD-10-CM | POA: Insufficient documentation

## 2011-01-15 DIAGNOSIS — M79609 Pain in unspecified limb: Secondary | ICD-10-CM | POA: Insufficient documentation

## 2011-01-15 DIAGNOSIS — G8929 Other chronic pain: Secondary | ICD-10-CM | POA: Insufficient documentation

## 2011-01-15 DIAGNOSIS — M546 Pain in thoracic spine: Secondary | ICD-10-CM | POA: Insufficient documentation

## 2011-02-02 ENCOUNTER — Emergency Department (HOSPITAL_COMMUNITY)
Admission: EM | Admit: 2011-02-02 | Discharge: 2011-02-02 | Disposition: A | Discharge status: Home / Self Care | Payer: Medicare Other | Attending: Emergency Medicine | Admitting: Emergency Medicine

## 2011-02-02 DIAGNOSIS — M549 Dorsalgia, unspecified: Secondary | ICD-10-CM | POA: Insufficient documentation

## 2011-02-02 DIAGNOSIS — F4310 Post-traumatic stress disorder, unspecified: Secondary | ICD-10-CM | POA: Insufficient documentation

## 2011-02-02 DIAGNOSIS — F411 Generalized anxiety disorder: Secondary | ICD-10-CM | POA: Insufficient documentation

## 2011-02-02 DIAGNOSIS — F988 Other specified behavioral and emotional disorders with onset usually occurring in childhood and adolescence: Secondary | ICD-10-CM | POA: Insufficient documentation

## 2011-02-02 DIAGNOSIS — G8929 Other chronic pain: Secondary | ICD-10-CM | POA: Insufficient documentation

## 2011-02-02 DIAGNOSIS — R569 Unspecified convulsions: Secondary | ICD-10-CM | POA: Insufficient documentation

## 2011-02-08 ENCOUNTER — Emergency Department (HOSPITAL_COMMUNITY): Payer: Medicare Other

## 2011-02-08 ENCOUNTER — Emergency Department (HOSPITAL_COMMUNITY)
Admission: EM | Admit: 2011-02-08 | Discharge: 2011-02-08 | Disposition: A | Discharge status: Home / Self Care | Payer: Medicare Other | Attending: Emergency Medicine | Admitting: Emergency Medicine

## 2011-02-08 DIAGNOSIS — M25579 Pain in unspecified ankle and joints of unspecified foot: Secondary | ICD-10-CM | POA: Insufficient documentation

## 2011-02-08 DIAGNOSIS — G40909 Epilepsy, unspecified, not intractable, without status epilepticus: Secondary | ICD-10-CM | POA: Insufficient documentation

## 2011-02-08 DIAGNOSIS — F1910 Other psychoactive substance abuse, uncomplicated: Secondary | ICD-10-CM | POA: Insufficient documentation

## 2011-02-08 DIAGNOSIS — F17200 Nicotine dependence, unspecified, uncomplicated: Secondary | ICD-10-CM | POA: Insufficient documentation

## 2011-02-22 LAB — MRSA PCR SCREENING: MRSA by PCR: NEGATIVE

## 2011-02-23 LAB — URINALYSIS, ROUTINE W REFLEX MICROSCOPIC
Bilirubin Urine: NEGATIVE
Glucose, UA: NEGATIVE mg/dL
Hgb urine dipstick: NEGATIVE
Ketones, ur: NEGATIVE mg/dL
Nitrite: NEGATIVE
Protein, ur: NEGATIVE mg/dL
Specific Gravity, Urine: 1.024 (ref 1.005–1.030)
Urobilinogen, UA: 1 mg/dL (ref 0.0–1.0)
pH: 6 (ref 5.0–8.0)

## 2011-02-25 LAB — RAPID URINE DRUG SCREEN, HOSP PERFORMED
Barbiturates: NOT DETECTED
Cocaine: POSITIVE — AB
Opiates: NOT DETECTED
Tetrahydrocannabinol: NOT DETECTED

## 2011-02-25 LAB — COMPREHENSIVE METABOLIC PANEL
Albumin: 4 g/dL (ref 3.5–5.2)
BUN: 9 mg/dL (ref 6–23)
Chloride: 112 mEq/L (ref 96–112)
Creatinine, Ser: 0.79 mg/dL (ref 0.4–1.5)
Total Bilirubin: 0.3 mg/dL (ref 0.3–1.2)
Total Protein: 6.9 g/dL (ref 6.0–8.3)

## 2011-02-25 LAB — DIFFERENTIAL
Basophils Absolute: 0 10*3/uL (ref 0.0–0.1)
Lymphocytes Relative: 43 % (ref 12–46)
Monocytes Absolute: 0.6 10*3/uL (ref 0.1–1.0)
Neutro Abs: 4 10*3/uL (ref 1.7–7.7)
Neutrophils Relative %: 46 % (ref 43–77)

## 2011-02-25 LAB — CBC
HCT: 40.4 % (ref 39.0–52.0)
MCV: 93.9 fL (ref 78.0–100.0)
Platelets: 309 10*3/uL (ref 150–400)
RDW: 14.2 % (ref 11.5–15.5)

## 2011-03-13 ENCOUNTER — Emergency Department (HOSPITAL_COMMUNITY): Payer: Medicare Other

## 2011-03-13 ENCOUNTER — Emergency Department (HOSPITAL_COMMUNITY)
Admission: EM | Admit: 2011-03-13 | Discharge: 2011-03-13 | Disposition: A | Discharge status: Home / Self Care | Payer: Medicare Other | Attending: Emergency Medicine | Admitting: Emergency Medicine

## 2011-03-13 ENCOUNTER — Emergency Department (HOSPITAL_COMMUNITY)
Admission: EM | Admit: 2011-03-13 | Discharge: 2011-03-14 | Disposition: A | Discharge status: Home / Self Care | Payer: Medicare Other | Attending: Emergency Medicine | Admitting: Emergency Medicine

## 2011-03-13 DIAGNOSIS — F329 Major depressive disorder, single episode, unspecified: Secondary | ICD-10-CM | POA: Insufficient documentation

## 2011-03-13 DIAGNOSIS — G8929 Other chronic pain: Secondary | ICD-10-CM | POA: Insufficient documentation

## 2011-03-13 DIAGNOSIS — E876 Hypokalemia: Secondary | ICD-10-CM | POA: Insufficient documentation

## 2011-03-13 DIAGNOSIS — Z79899 Other long term (current) drug therapy: Secondary | ICD-10-CM | POA: Insufficient documentation

## 2011-03-13 DIAGNOSIS — R569 Unspecified convulsions: Secondary | ICD-10-CM | POA: Insufficient documentation

## 2011-03-13 DIAGNOSIS — F3289 Other specified depressive episodes: Secondary | ICD-10-CM | POA: Insufficient documentation

## 2011-03-13 DIAGNOSIS — R079 Chest pain, unspecified: Secondary | ICD-10-CM | POA: Insufficient documentation

## 2011-03-13 DIAGNOSIS — M549 Dorsalgia, unspecified: Secondary | ICD-10-CM | POA: Insufficient documentation

## 2011-03-13 DIAGNOSIS — M25579 Pain in unspecified ankle and joints of unspecified foot: Secondary | ICD-10-CM | POA: Insufficient documentation

## 2011-03-13 DIAGNOSIS — F988 Other specified behavioral and emotional disorders with onset usually occurring in childhood and adolescence: Secondary | ICD-10-CM | POA: Insufficient documentation

## 2011-03-13 LAB — POCT I-STAT, CHEM 8
BUN: 10 mg/dL (ref 6–23)
Calcium, Ion: 1.21 mmol/L (ref 1.12–1.32)
Chloride: 109 mEq/L (ref 96–112)
Creatinine, Ser: 0.9 mg/dL (ref 0.4–1.5)
Glucose, Bld: 108 mg/dL — ABNORMAL HIGH (ref 70–99)
TCO2: 22 mmol/L (ref 0–100)

## 2011-03-13 LAB — DIFFERENTIAL
Eosinophils Absolute: 0.5 10*3/uL (ref 0.0–0.7)
Lymphocytes Relative: 39 % (ref 12–46)
Lymphs Abs: 3.8 10*3/uL (ref 0.7–4.0)
Monocytes Relative: 10 % (ref 3–12)
Neutro Abs: 4.5 10*3/uL (ref 1.7–7.7)
Neutrophils Relative %: 46 % (ref 43–77)

## 2011-03-13 LAB — CBC
HCT: 37 % — ABNORMAL LOW (ref 39.0–52.0)
Hemoglobin: 12.9 g/dL — ABNORMAL LOW (ref 13.0–17.0)
MCH: 32.4 pg (ref 26.0–34.0)
MCV: 93 fL (ref 78.0–100.0)
RBC: 3.98 MIL/uL — ABNORMAL LOW (ref 4.22–5.81)

## 2011-03-14 LAB — RAPID URINE DRUG SCREEN, HOSP PERFORMED
Amphetamines: POSITIVE — AB
Tetrahydrocannabinol: POSITIVE — AB

## 2011-03-15 ENCOUNTER — Emergency Department (HOSPITAL_COMMUNITY)
Admission: EM | Admit: 2011-03-15 | Discharge: 2011-03-15 | Discharge status: Against Medical Advice | Payer: Medicare Other | Attending: Emergency Medicine | Admitting: Emergency Medicine

## 2011-03-15 ENCOUNTER — Inpatient Hospital Stay (INDEPENDENT_AMBULATORY_CARE_PROVIDER_SITE_OTHER)
Admission: RE | Admit: 2011-03-15 | Discharge: 2011-03-15 | Disposition: A | Discharge status: Home / Self Care | Payer: Medicare Other | Source: Ambulatory Visit | Attending: Family Medicine | Admitting: Family Medicine

## 2011-03-15 DIAGNOSIS — F988 Other specified behavioral and emotional disorders with onset usually occurring in childhood and adolescence: Secondary | ICD-10-CM | POA: Insufficient documentation

## 2011-03-15 DIAGNOSIS — G8929 Other chronic pain: Secondary | ICD-10-CM | POA: Insufficient documentation

## 2011-03-15 DIAGNOSIS — Z760 Encounter for issue of repeat prescription: Secondary | ICD-10-CM | POA: Insufficient documentation

## 2011-03-15 DIAGNOSIS — Z79899 Other long term (current) drug therapy: Secondary | ICD-10-CM | POA: Insufficient documentation

## 2011-03-15 DIAGNOSIS — G40909 Epilepsy, unspecified, not intractable, without status epilepticus: Secondary | ICD-10-CM | POA: Insufficient documentation

## 2011-03-15 DIAGNOSIS — R569 Unspecified convulsions: Secondary | ICD-10-CM

## 2011-03-15 DIAGNOSIS — IMO0001 Reserved for inherently not codable concepts without codable children: Secondary | ICD-10-CM | POA: Insufficient documentation

## 2011-03-15 DIAGNOSIS — F329 Major depressive disorder, single episode, unspecified: Secondary | ICD-10-CM | POA: Insufficient documentation

## 2011-03-15 DIAGNOSIS — F3289 Other specified depressive episodes: Secondary | ICD-10-CM | POA: Insufficient documentation

## 2011-03-15 DIAGNOSIS — Z9889 Other specified postprocedural states: Secondary | ICD-10-CM | POA: Insufficient documentation

## 2011-03-15 LAB — POCT I-STAT, CHEM 8
BUN: 19 mg/dL (ref 6–23)
Calcium, Ion: 1.11 mmol/L — ABNORMAL LOW (ref 1.12–1.32)
Creatinine, Ser: 0.8 mg/dL (ref 0.4–1.5)
TCO2: 25 mmol/L (ref 0–100)

## 2011-03-15 LAB — RAPID URINE DRUG SCREEN, HOSP PERFORMED
Barbiturates: NOT DETECTED
Cocaine: NOT DETECTED
Opiates: NOT DETECTED

## 2011-03-16 LAB — POCT I-STAT, CHEM 8
Creatinine, Ser: 0.8 mg/dL (ref 0.4–1.5)
Glucose, Bld: 90 mg/dL (ref 70–99)
Hemoglobin: 16 g/dL (ref 13.0–17.0)
Potassium: 4.5 mEq/L (ref 3.5–5.1)
TCO2: 29 mmol/L (ref 0–100)

## 2011-03-17 LAB — DIFFERENTIAL
Eosinophils Relative: 3 % (ref 0–5)
Lymphocytes Relative: 30 % (ref 12–46)
Lymphs Abs: 3.5 10*3/uL (ref 0.7–4.0)
Neutro Abs: 7 10*3/uL (ref 1.7–7.7)
Neutrophils Relative %: 59 % (ref 43–77)

## 2011-03-17 LAB — CBC
HCT: 44 % (ref 39.0–52.0)
Platelets: 217 10*3/uL (ref 150–400)
WBC: 11.8 10*3/uL — ABNORMAL HIGH (ref 4.0–10.5)

## 2011-03-17 LAB — BASIC METABOLIC PANEL
BUN: 9 mg/dL (ref 6–23)
Calcium: 8.8 mg/dL (ref 8.4–10.5)
GFR calc non Af Amer: >60 mL/min (ref >60)
Potassium: 3.7 mEq/L (ref 3.5–5.1)

## 2011-03-17 LAB — ETHANOL: Alcohol, Ethyl (B): <5 mg/dL (ref 0–10)

## 2011-03-17 LAB — RAPID URINE DRUG SCREEN, HOSP PERFORMED
Cocaine: POSITIVE — AB
Tetrahydrocannabinol: NOT DETECTED

## 2011-03-18 ENCOUNTER — Emergency Department (HOSPITAL_COMMUNITY)
Admission: EM | Admit: 2011-03-18 | Discharge: 2011-03-18 | Disposition: A | Discharge status: Home / Self Care | Payer: Medicare Other | Attending: Emergency Medicine | Admitting: Emergency Medicine

## 2011-03-19 ENCOUNTER — Emergency Department (HOSPITAL_COMMUNITY)
Admission: EM | Admit: 2011-03-19 | Discharge: 2011-03-19 | Discharge status: Against Medical Advice | Payer: Medicare Other | Attending: Emergency Medicine | Admitting: Emergency Medicine

## 2011-03-19 DIAGNOSIS — IMO0001 Reserved for inherently not codable concepts without codable children: Secondary | ICD-10-CM | POA: Insufficient documentation

## 2011-03-20 LAB — COMPREHENSIVE METABOLIC PANEL
ALT: 17 U/L (ref 0–53)
AST: 23 U/L (ref 0–37)
Alkaline Phosphatase: 74 U/L (ref 39–117)
CO2: 27 mEq/L (ref 19–32)
Calcium: 9.6 mg/dL (ref 8.4–10.5)
Chloride: 107 mEq/L (ref 96–112)
GFR calc Af Amer: >60 mL/min (ref >60)
GFR calc non Af Amer: >60 mL/min (ref >60)
Glucose, Bld: 90 mg/dL (ref 70–99)
Potassium: 3.9 mEq/L (ref 3.5–5.1)
Sodium: 142 mEq/L (ref 135–145)
Total Bilirubin: 0.3 mg/dL (ref 0.3–1.2)

## 2011-03-20 LAB — GLUCOSE, CAPILLARY: Glucose-Capillary: 105 mg/dL — ABNORMAL HIGH (ref 70–99)

## 2011-03-20 LAB — URINALYSIS, ROUTINE W REFLEX MICROSCOPIC
Glucose, UA: NEGATIVE mg/dL
Hgb urine dipstick: NEGATIVE
Ketones, ur: NEGATIVE mg/dL
Protein, ur: NEGATIVE mg/dL
pH: 6 (ref 5.0–8.0)

## 2011-03-20 LAB — DIFFERENTIAL
Basophils Absolute: 0 10*3/uL (ref 0.0–0.1)
Basophils Relative: 0 % (ref 0–1)
Eosinophils Absolute: 0.3 10*3/uL (ref 0.0–0.7)
Eosinophils Absolute: 0.5 10*3/uL (ref 0.0–0.7)
Eosinophils Relative: 2 % (ref 0–5)
Eosinophils Relative: 4 % (ref 0–5)
Lymphocytes Relative: 25 % (ref 12–46)
Lymphs Abs: 3.2 10*3/uL (ref 0.7–4.0)
Lymphs Abs: 4 10*3/uL (ref 0.7–4.0)
Monocytes Relative: 8 % (ref 3–12)
Neutrophils Relative %: 61 % (ref 43–77)

## 2011-03-20 LAB — RAPID URINE DRUG SCREEN, HOSP PERFORMED
Amphetamines: NOT DETECTED
Amphetamines: NOT DETECTED
Barbiturates: NOT DETECTED
Benzodiazepines: POSITIVE — AB
Cocaine: POSITIVE — AB
Opiates: NOT DETECTED
Tetrahydrocannabinol: NOT DETECTED

## 2011-03-20 LAB — BASIC METABOLIC PANEL
Chloride: 110 mEq/L (ref 96–112)
GFR calc Af Amer: >60 mL/min (ref >60)
GFR calc non Af Amer: >60 mL/min (ref >60)
Potassium: 3.7 mEq/L (ref 3.5–5.1)
Sodium: 144 mEq/L (ref 135–145)

## 2011-03-20 LAB — CBC
HCT: 43.6 % (ref 39.0–52.0)
Hemoglobin: 15 g/dL (ref 13.0–17.0)
Hemoglobin: 15.4 g/dL (ref 13.0–17.0)
MCHC: 34 g/dL (ref 30.0–36.0)
MCV: 96.7 fL (ref 78.0–100.0)
RBC: 4.51 MIL/uL (ref 4.22–5.81)
RBC: 4.69 MIL/uL (ref 4.22–5.81)
WBC: 12.6 10*3/uL — ABNORMAL HIGH (ref 4.0–10.5)
WBC: 13.8 10*3/uL — ABNORMAL HIGH (ref 4.0–10.5)

## 2011-03-21 LAB — POCT CARDIAC MARKERS: Myoglobin, poc: 33.2 ng/mL (ref 12–200)

## 2011-03-21 LAB — POCT I-STAT, CHEM 8
BUN: 16 mg/dL (ref 6–23)
Creatinine, Ser: 0.7 mg/dL (ref 0.4–1.5)
Glucose, Bld: 97 mg/dL (ref 70–99)
Hemoglobin: 15 g/dL (ref 13.0–17.0)
Sodium: 142 mEq/L (ref 135–145)
TCO2: 24 mmol/L (ref 0–100)

## 2011-03-22 ENCOUNTER — Emergency Department (HOSPITAL_COMMUNITY)
Admission: EM | Admit: 2011-03-22 | Discharge: 2011-03-22 | Disposition: A | Discharge status: Home / Self Care | Payer: Medicare Other | Attending: Emergency Medicine | Admitting: Emergency Medicine

## 2011-03-22 ENCOUNTER — Emergency Department (HOSPITAL_COMMUNITY): Payer: Medicare Other

## 2011-03-22 DIAGNOSIS — X500XXA Overexertion from strenuous movement or load, initial encounter: Secondary | ICD-10-CM | POA: Insufficient documentation

## 2011-03-22 DIAGNOSIS — G40909 Epilepsy, unspecified, not intractable, without status epilepticus: Secondary | ICD-10-CM | POA: Insufficient documentation

## 2011-03-22 DIAGNOSIS — IMO0001 Reserved for inherently not codable concepts without codable children: Secondary | ICD-10-CM | POA: Insufficient documentation

## 2011-03-22 DIAGNOSIS — S93409A Sprain of unspecified ligament of unspecified ankle, initial encounter: Secondary | ICD-10-CM | POA: Insufficient documentation

## 2011-03-22 DIAGNOSIS — M25579 Pain in unspecified ankle and joints of unspecified foot: Secondary | ICD-10-CM | POA: Insufficient documentation

## 2011-03-22 LAB — CBC
HCT: 44.7 % (ref 39.0–52.0)
Hemoglobin: 15.4 g/dL (ref 13.0–17.0)
MCHC: 34.4 g/dL (ref 30.0–36.0)
Platelets: 268 10*3/uL (ref 150–400)
RDW: 13.6 % (ref 11.5–15.5)

## 2011-03-22 LAB — BASIC METABOLIC PANEL
BUN: 8 mg/dL (ref 6–23)
CO2: 23 mEq/L (ref 19–32)
Calcium: 9.2 mg/dL (ref 8.4–10.5)
Glucose, Bld: 79 mg/dL (ref 70–99)
Potassium: 4.2 mEq/L (ref 3.5–5.1)
Sodium: 140 mEq/L (ref 135–145)

## 2011-03-22 LAB — DIFFERENTIAL
Basophils Absolute: 0.1 10*3/uL (ref 0.0–0.1)
Eosinophils Absolute: 0.5 10*3/uL (ref 0.0–0.7)
Lymphs Abs: 3 10*3/uL (ref 0.7–4.0)
Neutro Abs: 7.7 10*3/uL (ref 1.7–7.7)

## 2011-03-23 LAB — POCT I-STAT, CHEM 8
BUN: 10 mg/dL (ref 6–23)
Calcium, Ion: 1.2 mmol/L (ref 1.12–1.32)
Chloride: 105 mEq/L (ref 96–112)
Creatinine, Ser: 1 mg/dL (ref 0.4–1.5)
Glucose, Bld: 99 mg/dL (ref 70–99)
HCT: 50 % (ref 39.0–52.0)
Potassium: 4 mEq/L (ref 3.5–5.1)

## 2011-03-25 LAB — BASIC METABOLIC PANEL
BUN: 7 mg/dL (ref 6–23)
Calcium: 9.3 mg/dL (ref 8.4–10.5)
Calcium: 9.3 mg/dL (ref 8.4–10.5)
Chloride: 109 mEq/L (ref 96–112)
Chloride: 105 mEq/L (ref 96–112)
Creatinine, Ser: 0.73 mg/dL (ref 0.4–1.5)
Creatinine, Ser: 0.75 mg/dL (ref 0.4–1.5)
GFR calc Af Amer: >60 mL/min (ref >60)

## 2011-03-25 LAB — DIFFERENTIAL
Basophils Absolute: 0.1 K/uL (ref 0.0–0.1)
Basophils Relative: 1 % (ref 0–1)
Eosinophils Absolute: 0.6 10*3/uL (ref 0.0–0.7)
Eosinophils Relative: 9 % — ABNORMAL HIGH (ref 0–5)
Lymphocytes Relative: 46 % (ref 12–46)
Lymphocytes Relative: 32 % (ref 12–46)
Lymphs Abs: 3.4 10*3/uL (ref 0.7–4.0)
Lymphs Abs: 3.3 10*3/uL (ref 0.7–4.0)
Monocytes Absolute: 0.6 K/uL (ref 0.1–1.0)
Monocytes Relative: 8 % (ref 3–12)
Monocytes Relative: 7 % (ref 3–12)
Neutro Abs: 2.7 10*3/uL (ref 1.7–7.7)
Neutro Abs: 5.8 10*3/uL (ref 1.7–7.7)
Neutrophils Relative %: 36 % — ABNORMAL LOW (ref 43–77)
Neutrophils Relative %: 56 % (ref 43–77)

## 2011-03-25 LAB — RAPID URINE DRUG SCREEN, HOSP PERFORMED
Amphetamines: NOT DETECTED
Amphetamines: NOT DETECTED
Benzodiazepines: POSITIVE — AB
Cocaine: NOT DETECTED
Cocaine: POSITIVE — AB
Opiates: POSITIVE — AB
Tetrahydrocannabinol: POSITIVE — AB
Tetrahydrocannabinol: POSITIVE — AB

## 2011-03-25 LAB — CBC
HCT: 46.5 % (ref 39.0–52.0)
Hemoglobin: 15.8 g/dL (ref 13.0–17.0)
MCHC: 33.9 g/dL (ref 30.0–36.0)
MCV: 94.8 fL (ref 78.0–100.0)
Platelets: 243 10*3/uL (ref 150–400)
RBC: 4.91 MIL/uL (ref 4.22–5.81)
RBC: 4.49 MIL/uL (ref 4.22–5.81)
RDW: 14.2 % (ref 11.5–15.5)
WBC: 7.4 10*3/uL (ref 4.0–10.5)
WBC: 10.4 10*3/uL (ref 4.0–10.5)

## 2011-03-25 LAB — BASIC METABOLIC PANEL WITH GFR
CO2: 25 meq/L (ref 19–32)
GFR calc Af Amer: >60 mL/min (ref >60)
GFR calc non Af Amer: >60 mL/min (ref >60)
Glucose, Bld: 93 mg/dL (ref 70–99)
Potassium: 4.5 meq/L (ref 3.5–5.1)
Sodium: 141 meq/L (ref 135–145)

## 2011-03-25 LAB — ETHANOL
Alcohol, Ethyl (B): 14 mg/dL — ABNORMAL HIGH (ref 0–10)
Alcohol, Ethyl (B): <5 mg/dL (ref 0–10)

## 2011-03-29 LAB — RAPID URINE DRUG SCREEN, HOSP PERFORMED
Benzodiazepines: POSITIVE — AB
Cocaine: POSITIVE — AB
Tetrahydrocannabinol: POSITIVE — AB

## 2011-03-29 LAB — PHENYTOIN LEVEL, TOTAL: Phenytoin Lvl: <2.5 ug/mL — ABNORMAL LOW (ref 10.0–20.0)

## 2011-04-17 ENCOUNTER — Emergency Department (HOSPITAL_COMMUNITY)
Admission: EM | Admit: 2011-04-17 | Discharge: 2011-04-17 | Disposition: A | Discharge status: Home / Self Care | Payer: Medicare Other | Attending: Emergency Medicine | Admitting: Emergency Medicine

## 2011-04-17 ENCOUNTER — Inpatient Hospital Stay (INDEPENDENT_AMBULATORY_CARE_PROVIDER_SITE_OTHER)
Admission: RE | Admit: 2011-04-17 | Discharge: 2011-04-17 | Disposition: A | Discharge status: Home / Self Care | Payer: Medicare Other | Source: Ambulatory Visit | Attending: Family Medicine | Admitting: Family Medicine

## 2011-04-17 DIAGNOSIS — Z79899 Other long term (current) drug therapy: Secondary | ICD-10-CM | POA: Insufficient documentation

## 2011-04-17 DIAGNOSIS — G40909 Epilepsy, unspecified, not intractable, without status epilepticus: Secondary | ICD-10-CM | POA: Insufficient documentation

## 2011-04-17 DIAGNOSIS — G8929 Other chronic pain: Secondary | ICD-10-CM | POA: Insufficient documentation

## 2011-04-17 DIAGNOSIS — M545 Low back pain, unspecified: Secondary | ICD-10-CM | POA: Insufficient documentation

## 2011-04-17 DIAGNOSIS — M546 Pain in thoracic spine: Secondary | ICD-10-CM | POA: Insufficient documentation

## 2011-04-17 DIAGNOSIS — IMO0001 Reserved for inherently not codable concepts without codable children: Secondary | ICD-10-CM

## 2011-04-17 DIAGNOSIS — R6889 Other general symptoms and signs: Secondary | ICD-10-CM

## 2011-04-19 ENCOUNTER — Emergency Department (HOSPITAL_COMMUNITY)
Admission: EM | Admit: 2011-04-19 | Discharge: 2011-04-19 | Disposition: A | Discharge status: Home / Self Care | Payer: Medicare Other | Attending: Emergency Medicine | Admitting: Emergency Medicine

## 2011-04-19 DIAGNOSIS — F17200 Nicotine dependence, unspecified, uncomplicated: Secondary | ICD-10-CM | POA: Insufficient documentation

## 2011-04-19 DIAGNOSIS — G40909 Epilepsy, unspecified, not intractable, without status epilepticus: Secondary | ICD-10-CM | POA: Insufficient documentation

## 2011-04-19 DIAGNOSIS — Z79899 Other long term (current) drug therapy: Secondary | ICD-10-CM | POA: Insufficient documentation

## 2011-04-19 DIAGNOSIS — IMO0001 Reserved for inherently not codable concepts without codable children: Secondary | ICD-10-CM | POA: Insufficient documentation

## 2011-04-22 ENCOUNTER — Emergency Department (HOSPITAL_COMMUNITY)
Admission: EM | Admit: 2011-04-22 | Discharge: 2011-04-22 | Disposition: A | Discharge status: Home / Self Care | Payer: Medicare Other | Attending: Emergency Medicine | Admitting: Emergency Medicine

## 2011-04-22 ENCOUNTER — Inpatient Hospital Stay (HOSPITAL_COMMUNITY): Admit: 2011-04-22 | Discharge: 2011-04-22 | Disposition: A | Discharge status: Home / Self Care | Payer: Medicare Other

## 2011-04-22 DIAGNOSIS — Z79899 Other long term (current) drug therapy: Secondary | ICD-10-CM | POA: Insufficient documentation

## 2011-04-22 DIAGNOSIS — M549 Dorsalgia, unspecified: Secondary | ICD-10-CM | POA: Insufficient documentation

## 2011-04-22 DIAGNOSIS — F17200 Nicotine dependence, unspecified, uncomplicated: Secondary | ICD-10-CM | POA: Insufficient documentation

## 2011-04-22 DIAGNOSIS — G40909 Epilepsy, unspecified, not intractable, without status epilepticus: Secondary | ICD-10-CM | POA: Insufficient documentation

## 2011-04-22 DIAGNOSIS — G8929 Other chronic pain: Secondary | ICD-10-CM | POA: Insufficient documentation

## 2011-04-27 NOTE — Discharge Summary (Signed)
[name] Mcdonald, [name] Mcdonald                ACCOUNT NO.:  [id number]   MEDICAL RECORD NO.:  [id number]          PATIENT TYPE:  IPS   LOCATION:  0403                          [facility]   PHYSICIAN:  Anselm Jungling, MD  DATE OF BIRTH:  [birth year]   DATE OF ADMISSION:  07/14/2009  DATE OF DISCHARGE:  07/15/2009                               DISCHARGE SUMMARY   IDENTIFYING INFORMATION:  This is a 40 year old male.  This is a  voluntary admission.   HISTORY OF PRESENT ILLNESS:  This is the second [facility] admission for this  40 year old who presented requesting detox from opiates.  Please see the  dictated psychiatric admission assessment for details about his history  and presenting symptoms.  Opana opiates on a pretty much daily basis for  about a year and 3-4 months ago, began using heroin which he was smoking  and snorting.   Past psychiatric history is significant for history of attention deficit  disorder and anxiety disorder, treated in the past by Dr. Elna Breslow in  [facility].  He is under no current outpatient care  has presented frequently in the emergency room with seizures for which  he is taking Klonopin.  He denied any suicidal thoughts.   COURSE OF HOSPITALIZATION:  He was admitted to our dual diagnosis team  and started on a clonidine protocol for opiate detox.  His routine  medications were continued including his Klonopin as recently prescribed  1 week ago by the emergency room.  As the course of the day continued,  he declined to continue with detox, felt that he needed to get back home  and take care for his mother who was there, decided to discontinue the  detox.  He has some thoughts of possibly going to [facility]  and requested information from our case worker.  Since he did not have  suicidal thoughts, was fully alert, coherent, we agreed to discharge  him.   The patient's primary care physician is Dr. Joseph Art at [facility] who he reports discharged him last week.  He is also followed by  Dr. Ansel Bong at [facility] for a seizure disorder.  Comorbid medical problems include fibromyalgia and seizure disorder.   MENTAL STATUS EXAM:  Revealed a fully alert male pleasant, cooperative,  good eye contact, in full contact with reality.  No signs of thought  disorder,  was cooperative with staff, giving a coherent history.  No  dangerous ideas.   DISCHARGE MEDICATIONS:  Meloxicam 15 mg daily, Lyrica 300 mg twice  daily, ranitidine 300 mg twice daily, tizanidine 4 mg up to 3 times a  day as needed for muscle spasms, Klonopin 2 mg t.i.d. as prescribed by  the emergency room 1 week previously.  Prescriptions were given for  these medications other than the Klonopin for which he already had a  prescription.   He was instructed to follow up with his neurologist for his seizure  disorder,  that is Dr. Ansel Bong at [facility] in [facility].  Amphetamines salts, which he  has taken in the past, were not  continued during his stay.  Given phone number and information for at  [facility] and he will contact him on his own.   DISCHARGE DIAGNOSIS:  Axis I: Opiate abuse and dependence.  Axis II: No diagnosis.  Axis III: Seizure disorder, fibromyalgia.  Axis IV: No diagnosis.  Axis V: Current 62, past year 66 estimated.   The patient is discharged today.      Margaret A. Lorin Picket, N.P.      Anselm Jungling, MD  Electronically Signed    MAS/MEDQ  D:  07/16/2009  T:  07/16/2009  Job:  [id number]

## 2011-04-27 NOTE — H&P (Signed)
[name] Mcdonald, [name] Mcdonald NO.:  [id number]   MEDICAL RECORD NO.:  [id number]          PATIENT TYPE:  IPS   LOCATION:  0403                          [facility]ILITY[facility]  [facility]   PHYSICIAN:  Anselm Jungling, MD  DATE OF BIRTH:  [birth year]   DATE OF ADMISSION:  07/14/2009  DATE OF DISCHARGE:  07/15/2009                       PSYCHIATRIC ADMISSION ASSESSMENT   IDENTIFICATION:  This is a 40 year old male.  This is a voluntary  admission.   HISTORY OF PRESENT ILLNESS:  Second or third [facility] admission for this 39-  year-old who we have not seen in several years presented in the  emergency room requesting detox from opiates.  Says that he has been  using opiates for about a year now since he was discharged from [facility] where he was seeing Dr. Nilsa Nutting.  For several  months now, he has been taking 40 mg Opana tablets 40 mg, 2-3 at a time,  but mostly 5-6 at a time, having to pay $15 each for them on the street.  In the past few months, he added oral morphine, taking either orally or  crushing and snorting 80 mg, sustained release morphine capsules,  usually two at a time, doing this for more than a year.  In the past 3  months, gradually began using heroin and says he is getting black tar  heroin and smoking it.  He is also presenting in the emergency room  frequently complaining of seizures and requesting Klonopin 2 mg two to  three times a day.  He reports he has been on this for many years and  does not want to go off of it.  It is unclear if he is abusing it,  although he reports it is not provided by his neurologist.   PAST PSYCHIATRIC HISTORY:  No current outpatient psychiatric care.  He  reports his previous primary care physician was prescribing Cymbalta,  and at one point someone was prescribing Klonopin for him and also the  Adderall previously prescribed by a PCP.  He reports he became involved  in opiates due to the pain from his chronic fibromyalgia for  which he  was at one point enrolled in a pain clinic.  Last [facility] admission was June  27-29 for an intentional overdose of Toradol, feeling at that time that  it was intentional.  At that point, he was being seen as an outpatient  by Dr. Elna Breslow who was prescribing Klonopin and Adderall.  He has a  history of attention deficit hyperactivity disorder that was first  diagnosed in his teens, and also has a history of attention disorder.  In the past, he has taken Adderall 20 mg q.a.m. for his ADHD.   SOCIAL HISTORY:  Single male.  This is a voluntary admission.  He  endorses some social isolation.   MEDICAL HISTORY:  Primary care Dontarius Sheley was Dr. Joseph Art at [facility].  He reports being discharged from this practice last week.  He reports also Dr. Ansel Bong in at [facility],  currently following him for a seizure disorder.  MEDICAL PROBLEMS:  1. Seizures NOS.  2. Polysubstance abuse.  3. Fibromyalgia.  4. History of meningitis.   CURRENT MEDICATIONS:  1. Amphetamine salts 20 mg daily b.i.d., last taken not known.  2. Lyrica 300 mg b.i.d.  3. Meloxicam 15 mg daily.  4. Ranitidine 300 mg twice a day.  5. Cymbalta 60 mg daily.  6. Tizanidine 4 mg t.i.d. p.r.n. for muscle spasms.   DRUG ALLERGIES:  Phenobarbital, Paxil, Dilantin and Darvocet.   PHYSICAL EXAMINATION:  Done in the emergency room by Dr. Devoria Albe as  noted in the record.   DIAGNOSTIC STUDIES:  Remarkable for urine drug screen, positive for  cocaine and benzodiazepines.  Alcohol level less than five.  Liver  enzymes normal.  Normal chemistry.  CBC:  WBC 13.8, hematocrit 15.4,  hematocrit 45.2, platelets 275,000.  The small compact adult male in no  acute distress today, was quite sleepy earlier, but alert and able to  give full history.   MENTAL STATUS EXAM:  Fully alert male, cooperative, good eye contact  concerned about restarting his routine medications.  Candid about his   opiate use.  Feels he cannot go off his benzodiazepines.  Insight  adequate.  Memory intact.  No signs of delirium or confusion.  No  thought disorder.  He is denying any suicidal thoughts.   DISCHARGE DIAGNOSES:  AXIS I:  Opiate abuse and rule out dependence.  Rule out benzodiazepine abuse.  AXIS II:  No diagnosis.  AXIS III:  Fibromyalgia.  AXIS IV:  Deferred.  AXIS V:  Current 52, past year not known.   PLAN:  Plan is to voluntarily admit him to our dual diagnosis unit.  We  are going to continue his routine medications with the exception of  holding his Adderall 20 mg p.o. b.i.d.  His Klonopin is being continued,  at this point 2 mg p.o. t.i.d. based on what was prescribed in the  emergency room last week and will continue his Zantac for which we  substitute Protonix 40 mg daily and will continue his other routine  medications including his Cymbalta 60 mg daily.      Margaret A. Lorin Picket, N.P.      Anselm Jungling, MD  Electronically Signed    MAS/MEDQ  D:  07/15/2009  T:  07/15/2009  Job:  [id number]
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
## 2011-04-30 NOTE — Consult Note (Signed)
NAMECRYSTAL, [name] Mcdonald                            ACCOUNT NO.:  [id number]   MEDICAL RECORD NO.:  [id number]                   PATIENT TYPE:  EMS   LOCATION:  MINO                                 FACILITY:  [facility]   PHYSICIAN:  Sondra Come, D.O.                 DATE OF BIRTH:  [birth year]   DATE OF CONSULTATION:  07/27/2002  DATE OF DISCHARGE:  07/19/2002                  PHYSICAL MEDICINE & REHABILITATION CONSULTATION   REASON FOR CONSULTATION:  The patient returns to clinic today for re-  evaluation.  He was initially seen on 07/05/02, for low back pain, felt to be  mainly myofascial.  The patient did not fill his Bextra, stating the  pharmacy told him that Medicaid would not cover it.  This can be justified  by the patient's history of ulcer.  In addition, the patient has not gone to  physical therapy as prescribed, stating that he lost his prescription.  He  returns to clinic today complaining of severe pain, rating it greater than  10/10 on a subjective scale with declined function and quality of life  indices and poor sleep.  He is currently only taking Klonopin for his  seizure disorder.  I review health and history form and 14 point review of  systems.  The patient states that the pain radiates into his right lower  extremity on occasion with associated numbness in the entire right lower  extremity.  He denies bowel and bladder dysfunction.   PHYSICAL EXAMINATION:  GENERAL:  A healthy male in no acute distress.  VITAL SIGNS:  Blood pressure 125/60, pulse 81, respiratory rate 16, O2  saturation 100% on room air.  BACK:  A level pelvis without scoliosis.  There is significant tenderness to  very light palpation in the right lumbar paraspinous region.  This is  somewhat of an exaggerated response.  Positive skin rolling.  There is  increased low back pain with rotation of the entire trunk as a unit.  There  is positive axial compression test.  Mildly positive distraction test.  NEUROLOGIC:  The patient has 5/5 strength bilateral lower extremities.  Sensory examination reveals decreased to light touch subjectively of the  entire right lower extremity in a non dermatomal distribution.  Muscle  stretch reflexes are 2+/4 bilateral patellar, medial hamstrings, and  Achilles.  Straight leg raise is negative bilaterally, but causes increased  pain in his low back with right straight leg raise at approximately 10  degrees.   IMPRESSION:  1. Low back pain, myofascial.  Positive Waddell's signs, suggests some     psychological overlay or secondary gain.  2. History of seizure disorder.   PLAN:  1. I discussed treatment options with the patient.  At this point, he has     not followed through with previous recommendations.  Therefore, I will     make the same recommendations.  We will have the patient fill the Bextra  prescription to take 20 mg one p.o. q.d.  2. We will write a new prescription for physical therapy, and the patient is     instructed to go to physical therapy.  3. We will prescribe Flexeril 10 mg one p.o. q.8h. #30 without refills.  The     patient is to take this up to t.i.d. p.r.n.  Ultimately, will come off of     the muscle relaxor.  4. Opiate based pain medications are not warranted at this time.  5. Consider further imaging studies if symptoms are not improving.  6. The patient is to return to clinic in two months for re-evaluation.   The patient was educated on the above findings and recommendations and  understands.  There were no barriers to communication.                                               Sondra Come, D.O.    JJW/MEDQ  D:  07/27/2002  T:  07/28/2002  Job:  [id number]   cc:   Jocelyn Lamer D. Pecola Leisure, M.D.
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
## 2011-04-30 NOTE — Discharge Summary (Signed)
NAMEATOM, SOLIVAN NO.:  [id number]   MEDICAL RECORD NO.:  [id number]                  PATIENT TYPE:  PS   LOCATION:  0508                                 FACILITY:  [facility]   PHYSICIAN:  Jeanice Lim, MD                DATE OF BIRTH:  12/03/1971   DATE OF ADMISSION:  06/08/2002  DATE OF DISCHARGE:  06/10/2002                                 DISCHARGE SUMMARY   IDENTIFYING DATA:  This is a 40 year old single Caucasian male voluntarily  admitted status post intentional overdose on Toradol, reporting that it was  an impulsive act.   MEDICATIONS:  1. Oxycodone.  2. Toradol.  3. Klonopin.  4. Adderall prescribed by Rozanna Box.   DRUG ALLERGIES:  DARVOCET and PAXIL, which Paxil causes tremors.   PHYSICAL EXAMINATION:  GENERAL: Essentially within normal limits.  NEUROLOGIC: Nonfocal.   LABORATORY DATA:  Routine admission labs: Acetaminophen level and salicylate  level were within normal limits.  Alcohol level less than 5.   MENTAL STATUS EXAM:  Alert, oriented, small Caucasian male, cooperative,  casually dressed.  Speech: Clear.  Affect: Flat.  Thought process: Goal  directed.  Thought content: Negative for dangerous ideation or psychotic  symptoms.  Cognitive: Intact.  Judgment and insight: Poor.  The patient was  a questionable historian.   ADMISSION DIAGNOSES:   AXIS I:  1. Anxiety disorder.  2. Attention-deficit hyperactivity disorder, by history.   AXIS II:  None.   AXIS III:  1. Fractured ribs.  2. History of meningitis.  3. Seizure disorder.   AXIS IV:  Moderate problems with support system.   AXIS V:  [id number]   HOSPITAL COURSE:  The patient was admitted and ordered routine p.r.n.  medications and underwent further monitoring.  He was restarted on Klonopin  and Adderall.  Family was contacted and aftercare planning completed.  The  patient participated in treatment and reported a positive response to  clinical  intervention.   CONDITION ON DISCHARGE:  Condition on discharge was improved.  Mood was more  euthymic.  Affect: Brighter.  Thought processes: Goal directed.  Thought  content: Negative for dangerous ideation or psychotic symptoms.  The patient  reported improved judgment and insight and the importance of compliance with  medications and followup.   DISCHARGE MEDICATIONS:  1. Protonix 40 mg q.a.m.  2. Klonopin 2 mg b.i.d.  3. Percocet one q.6.h. p.r.n. pain.  4. Ambien 10 mg q.h.s. p.r.n.  5. Adderall 20 mg q.a.m.   FOLLOW UP:  Rozanna Box within one week of discharge.   DISCHARGE DIAGNOSES:   AXIS I:  1. Anxiety disorder.  2. Attention-deficit hyperactivity disorder, by history.   AXIS II:  None.   AXIS III:  1. Fractured ribs.  2. History of meningitis.  3. Seizure disorder.   AXIS IV:  Moderate problems with support system.   AXIS V:  Global assessment of functioning on discharge was 55.                                                Jeanice Lim, MD    JEM/MEDQ  D:  08/16/2002  T:  08/17/2002  Job:  [id number]
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
## 2011-05-01 ENCOUNTER — Emergency Department (HOSPITAL_COMMUNITY)
Admission: EM | Admit: 2011-05-01 | Discharge: 2011-05-01 | Disposition: A | Discharge status: Home / Self Care | Payer: Medicare Other | Attending: Emergency Medicine | Admitting: Emergency Medicine

## 2011-05-01 DIAGNOSIS — IMO0001 Reserved for inherently not codable concepts without codable children: Secondary | ICD-10-CM | POA: Insufficient documentation

## 2011-05-01 DIAGNOSIS — F329 Major depressive disorder, single episode, unspecified: Secondary | ICD-10-CM | POA: Insufficient documentation

## 2011-05-01 DIAGNOSIS — F3289 Other specified depressive episodes: Secondary | ICD-10-CM | POA: Insufficient documentation

## 2011-05-01 DIAGNOSIS — G40909 Epilepsy, unspecified, not intractable, without status epilepticus: Secondary | ICD-10-CM | POA: Insufficient documentation

## 2011-05-01 DIAGNOSIS — Z79899 Other long term (current) drug therapy: Secondary | ICD-10-CM | POA: Insufficient documentation

## 2011-05-01 DIAGNOSIS — F17200 Nicotine dependence, unspecified, uncomplicated: Secondary | ICD-10-CM | POA: Insufficient documentation

## 2011-05-01 LAB — COMPREHENSIVE METABOLIC PANEL
ALT: 22 U/L (ref 0–53)
AST: 27 U/L (ref 0–37)
Albumin: 3.9 g/dL (ref 3.5–5.2)
CO2: 28 mEq/L (ref 19–32)
Calcium: 9.4 mg/dL (ref 8.4–10.5)
GFR calc Af Amer: >60 mL/min (ref >60)
GFR calc non Af Amer: >60 mL/min (ref >60)
Sodium: 142 mEq/L (ref 135–145)
Total Protein: 6.9 g/dL (ref 6.0–8.3)

## 2011-05-01 LAB — CBC
HCT: 43 % (ref 39.0–52.0)
Hemoglobin: 14.8 g/dL (ref 13.0–17.0)
MCH: 32.4 pg (ref 26.0–34.0)
MCV: 94.1 fL (ref 78.0–100.0)
RBC: 4.57 MIL/uL (ref 4.22–5.81)
WBC: 8.9 10*3/uL (ref 4.0–10.5)

## 2011-05-01 LAB — URINALYSIS, ROUTINE W REFLEX MICROSCOPIC
Glucose, UA: NEGATIVE mg/dL
Ketones, ur: NEGATIVE mg/dL
Protein, ur: NEGATIVE mg/dL

## 2011-05-01 LAB — DIFFERENTIAL
Basophils Relative: 1 % (ref 0–1)
Eosinophils Relative: 6 % — ABNORMAL HIGH (ref 0–5)
Lymphs Abs: 4 10*3/uL (ref 0.7–4.0)
Monocytes Relative: 8 % (ref 3–12)
Neutro Abs: 3.6 10*3/uL (ref 1.7–7.7)

## 2011-05-01 LAB — RAPID URINE DRUG SCREEN, HOSP PERFORMED
Benzodiazepines: NOT DETECTED
Tetrahydrocannabinol: POSITIVE — AB

## 2011-05-02 ENCOUNTER — Emergency Department (HOSPITAL_COMMUNITY)
Admission: EM | Admit: 2011-05-02 | Discharge: 2011-05-02 | Disposition: A | Discharge status: Home / Self Care | Payer: Medicare Other | Attending: Emergency Medicine | Admitting: Emergency Medicine

## 2011-05-02 DIAGNOSIS — Z79899 Other long term (current) drug therapy: Secondary | ICD-10-CM | POA: Insufficient documentation

## 2011-05-02 DIAGNOSIS — IMO0001 Reserved for inherently not codable concepts without codable children: Secondary | ICD-10-CM | POA: Insufficient documentation

## 2011-05-02 DIAGNOSIS — G40909 Epilepsy, unspecified, not intractable, without status epilepticus: Secondary | ICD-10-CM | POA: Insufficient documentation

## 2011-05-02 DIAGNOSIS — F17200 Nicotine dependence, unspecified, uncomplicated: Secondary | ICD-10-CM | POA: Insufficient documentation

## 2011-05-02 DIAGNOSIS — F411 Generalized anxiety disorder: Secondary | ICD-10-CM | POA: Insufficient documentation

## 2011-05-06 ENCOUNTER — Emergency Department (HOSPITAL_COMMUNITY)
Admission: EM | Admit: 2011-05-06 | Discharge: 2011-05-06 | Disposition: A | Discharge status: Home / Self Care | Payer: Medicare Other | Attending: Emergency Medicine | Admitting: Emergency Medicine

## 2011-05-06 ENCOUNTER — Emergency Department (HOSPITAL_COMMUNITY): Payer: Medicare Other

## 2011-05-06 DIAGNOSIS — R609 Edema, unspecified: Secondary | ICD-10-CM | POA: Insufficient documentation

## 2011-05-06 DIAGNOSIS — W268XXA Contact with other sharp object(s), not elsewhere classified, initial encounter: Secondary | ICD-10-CM | POA: Insufficient documentation

## 2011-05-06 DIAGNOSIS — S91309A Unspecified open wound, unspecified foot, initial encounter: Secondary | ICD-10-CM | POA: Insufficient documentation

## 2011-05-06 DIAGNOSIS — G40909 Epilepsy, unspecified, not intractable, without status epilepticus: Secondary | ICD-10-CM | POA: Insufficient documentation

## 2011-05-06 DIAGNOSIS — Z79899 Other long term (current) drug therapy: Secondary | ICD-10-CM | POA: Insufficient documentation

## 2011-05-06 DIAGNOSIS — M79609 Pain in unspecified limb: Secondary | ICD-10-CM | POA: Insufficient documentation

## 2011-05-18 ENCOUNTER — Emergency Department (HOSPITAL_COMMUNITY): Payer: Medicare Other

## 2011-05-18 ENCOUNTER — Emergency Department (HOSPITAL_COMMUNITY)
Admission: EM | Admit: 2011-05-18 | Discharge: 2011-05-18 | Disposition: A | Discharge status: Home / Self Care | Payer: Medicare Other | Attending: Emergency Medicine | Admitting: Emergency Medicine

## 2011-05-18 DIAGNOSIS — M25473 Effusion, unspecified ankle: Secondary | ICD-10-CM | POA: Insufficient documentation

## 2011-05-18 DIAGNOSIS — F1010 Alcohol abuse, uncomplicated: Secondary | ICD-10-CM | POA: Insufficient documentation

## 2011-05-18 DIAGNOSIS — W19XXXA Unspecified fall, initial encounter: Secondary | ICD-10-CM | POA: Insufficient documentation

## 2011-05-18 DIAGNOSIS — G40909 Epilepsy, unspecified, not intractable, without status epilepticus: Secondary | ICD-10-CM | POA: Insufficient documentation

## 2011-05-18 DIAGNOSIS — R4789 Other speech disturbances: Secondary | ICD-10-CM | POA: Insufficient documentation

## 2011-05-18 DIAGNOSIS — M25476 Effusion, unspecified foot: Secondary | ICD-10-CM | POA: Insufficient documentation

## 2011-05-18 DIAGNOSIS — Z79899 Other long term (current) drug therapy: Secondary | ICD-10-CM | POA: Insufficient documentation

## 2011-05-18 DIAGNOSIS — M25579 Pain in unspecified ankle and joints of unspecified foot: Secondary | ICD-10-CM | POA: Insufficient documentation

## 2011-05-24 ENCOUNTER — Emergency Department (HOSPITAL_COMMUNITY)
Admission: EM | Admit: 2011-05-24 | Discharge: 2011-05-24 | Disposition: A | Discharge status: Home / Self Care | Payer: Medicare Other | Attending: Emergency Medicine | Admitting: Emergency Medicine

## 2011-06-10 ENCOUNTER — Emergency Department (HOSPITAL_COMMUNITY): Payer: Medicare Other

## 2011-06-10 ENCOUNTER — Emergency Department (HOSPITAL_COMMUNITY)
Admission: EM | Admit: 2011-06-10 | Discharge: 2011-06-10 | Disposition: A | Discharge status: Home / Self Care | Payer: Medicare Other | Attending: Emergency Medicine | Admitting: Emergency Medicine

## 2011-06-10 DIAGNOSIS — F411 Generalized anxiety disorder: Secondary | ICD-10-CM | POA: Insufficient documentation

## 2011-06-10 DIAGNOSIS — M545 Low back pain, unspecified: Secondary | ICD-10-CM | POA: Insufficient documentation

## 2011-06-10 DIAGNOSIS — M542 Cervicalgia: Secondary | ICD-10-CM | POA: Insufficient documentation

## 2011-06-10 DIAGNOSIS — Z87828 Personal history of other (healed) physical injury and trauma: Secondary | ICD-10-CM | POA: Insufficient documentation

## 2011-06-10 DIAGNOSIS — G40909 Epilepsy, unspecified, not intractable, without status epilepticus: Secondary | ICD-10-CM | POA: Insufficient documentation

## 2011-06-18 ENCOUNTER — Emergency Department (HOSPITAL_COMMUNITY)
Admission: EM | Admit: 2011-06-18 | Discharge: 2011-06-18 | Discharge status: Against Medical Advice | Payer: Medicare Other | Attending: Emergency Medicine | Admitting: Emergency Medicine

## 2011-06-18 DIAGNOSIS — R569 Unspecified convulsions: Secondary | ICD-10-CM | POA: Insufficient documentation

## 2011-06-18 DIAGNOSIS — R51 Headache: Secondary | ICD-10-CM | POA: Insufficient documentation

## 2011-06-20 ENCOUNTER — Emergency Department (HOSPITAL_COMMUNITY)
Admission: EM | Admit: 2011-06-20 | Discharge: 2011-06-20 | Disposition: A | Discharge status: Home / Self Care | Payer: Medicare Other | Attending: Emergency Medicine | Admitting: Emergency Medicine

## 2011-06-20 DIAGNOSIS — G40802 Other epilepsy, not intractable, without status epilepticus: Secondary | ICD-10-CM | POA: Insufficient documentation

## 2011-06-20 DIAGNOSIS — R51 Headache: Secondary | ICD-10-CM | POA: Insufficient documentation

## 2011-06-20 DIAGNOSIS — Z79899 Other long term (current) drug therapy: Secondary | ICD-10-CM | POA: Insufficient documentation

## 2011-06-20 LAB — GLUCOSE, CAPILLARY: Glucose-Capillary: 101 mg/dL — ABNORMAL HIGH (ref 70–99)

## 2011-07-04 ENCOUNTER — Emergency Department (HOSPITAL_COMMUNITY): Payer: Medicare Other

## 2011-07-04 ENCOUNTER — Emergency Department (HOSPITAL_COMMUNITY)
Admission: EM | Admit: 2011-07-04 | Discharge: 2011-07-04 | Disposition: A | Discharge status: Home / Self Care | Payer: Medicare Other | Attending: Emergency Medicine | Admitting: Emergency Medicine

## 2011-07-04 DIAGNOSIS — F17200 Nicotine dependence, unspecified, uncomplicated: Secondary | ICD-10-CM | POA: Insufficient documentation

## 2011-07-04 DIAGNOSIS — S0003XA Contusion of scalp, initial encounter: Secondary | ICD-10-CM | POA: Insufficient documentation

## 2011-07-04 DIAGNOSIS — R51 Headache: Secondary | ICD-10-CM | POA: Insufficient documentation

## 2011-07-04 DIAGNOSIS — G40909 Epilepsy, unspecified, not intractable, without status epilepticus: Secondary | ICD-10-CM | POA: Insufficient documentation

## 2011-07-04 DIAGNOSIS — S139XXA Sprain of joints and ligaments of unspecified parts of neck, initial encounter: Secondary | ICD-10-CM | POA: Insufficient documentation

## 2011-07-04 DIAGNOSIS — M542 Cervicalgia: Secondary | ICD-10-CM | POA: Insufficient documentation

## 2011-07-04 DIAGNOSIS — M25529 Pain in unspecified elbow: Secondary | ICD-10-CM | POA: Insufficient documentation

## 2011-07-04 DIAGNOSIS — S5000XA Contusion of unspecified elbow, initial encounter: Secondary | ICD-10-CM | POA: Insufficient documentation

## 2011-07-04 DIAGNOSIS — Z79899 Other long term (current) drug therapy: Secondary | ICD-10-CM | POA: Insufficient documentation

## 2011-07-17 ENCOUNTER — Emergency Department (HOSPITAL_COMMUNITY)
Admission: EM | Admit: 2011-07-17 | Discharge: 2011-07-17 | Disposition: A | Discharge status: Home / Self Care | Payer: Medicare Other | Attending: Emergency Medicine | Admitting: Emergency Medicine

## 2011-07-17 DIAGNOSIS — F341 Dysthymic disorder: Secondary | ICD-10-CM | POA: Insufficient documentation

## 2011-07-17 DIAGNOSIS — F4310 Post-traumatic stress disorder, unspecified: Secondary | ICD-10-CM | POA: Insufficient documentation

## 2011-07-17 DIAGNOSIS — Z0000 Encounter for general adult medical examination without abnormal findings: Secondary | ICD-10-CM | POA: Insufficient documentation

## 2011-07-17 DIAGNOSIS — R51 Headache: Secondary | ICD-10-CM | POA: Insufficient documentation

## 2011-07-17 DIAGNOSIS — R42 Dizziness and giddiness: Secondary | ICD-10-CM | POA: Insufficient documentation

## 2011-07-17 DIAGNOSIS — F1910 Other psychoactive substance abuse, uncomplicated: Secondary | ICD-10-CM | POA: Insufficient documentation

## 2011-07-17 DIAGNOSIS — M25569 Pain in unspecified knee: Secondary | ICD-10-CM | POA: Insufficient documentation

## 2011-07-17 DIAGNOSIS — G40909 Epilepsy, unspecified, not intractable, without status epilepticus: Secondary | ICD-10-CM | POA: Insufficient documentation

## 2011-07-17 DIAGNOSIS — M542 Cervicalgia: Secondary | ICD-10-CM | POA: Insufficient documentation

## 2011-07-17 DIAGNOSIS — F988 Other specified behavioral and emotional disorders with onset usually occurring in childhood and adolescence: Secondary | ICD-10-CM | POA: Insufficient documentation

## 2011-07-19 ENCOUNTER — Emergency Department (HOSPITAL_COMMUNITY)
Admission: EM | Admit: 2011-07-19 | Discharge: 2011-07-19 | Disposition: A | Discharge status: Home / Self Care | Payer: Medicare Other | Attending: Emergency Medicine | Admitting: Emergency Medicine

## 2011-07-19 DIAGNOSIS — M353 Polymyalgia rheumatica: Secondary | ICD-10-CM | POA: Insufficient documentation

## 2011-07-19 DIAGNOSIS — Z0000 Encounter for general adult medical examination without abnormal findings: Secondary | ICD-10-CM | POA: Insufficient documentation

## 2011-07-19 DIAGNOSIS — F341 Dysthymic disorder: Secondary | ICD-10-CM | POA: Insufficient documentation

## 2011-07-19 DIAGNOSIS — F4310 Post-traumatic stress disorder, unspecified: Secondary | ICD-10-CM | POA: Insufficient documentation

## 2011-07-19 DIAGNOSIS — F1910 Other psychoactive substance abuse, uncomplicated: Secondary | ICD-10-CM | POA: Insufficient documentation

## 2011-07-19 DIAGNOSIS — M549 Dorsalgia, unspecified: Secondary | ICD-10-CM | POA: Insufficient documentation

## 2011-08-25 ENCOUNTER — Emergency Department (HOSPITAL_COMMUNITY)
Admission: EM | Admit: 2011-08-25 | Discharge: 2011-08-25 | Disposition: A | Discharge status: Home / Self Care | Payer: Medicare Other | Attending: Emergency Medicine | Admitting: Emergency Medicine

## 2011-08-25 DIAGNOSIS — Z760 Encounter for issue of repeat prescription: Secondary | ICD-10-CM | POA: Insufficient documentation

## 2011-08-25 DIAGNOSIS — F329 Major depressive disorder, single episode, unspecified: Secondary | ICD-10-CM | POA: Insufficient documentation

## 2011-08-25 DIAGNOSIS — F3289 Other specified depressive episodes: Secondary | ICD-10-CM | POA: Insufficient documentation

## 2011-08-25 DIAGNOSIS — G40909 Epilepsy, unspecified, not intractable, without status epilepticus: Secondary | ICD-10-CM | POA: Insufficient documentation

## 2011-08-25 DIAGNOSIS — F988 Other specified behavioral and emotional disorders with onset usually occurring in childhood and adolescence: Secondary | ICD-10-CM | POA: Insufficient documentation

## 2011-08-25 DIAGNOSIS — Z79899 Other long term (current) drug therapy: Secondary | ICD-10-CM | POA: Insufficient documentation

## 2011-08-27 ENCOUNTER — Inpatient Hospital Stay (HOSPITAL_COMMUNITY)
Admission: RE | Admit: 2011-08-27 | Discharge: 2011-08-27 | Disposition: A | Discharge status: Home / Self Care | Payer: Medicare Other | Source: Ambulatory Visit | Attending: Family Medicine | Admitting: Family Medicine

## 2011-08-27 ENCOUNTER — Emergency Department (HOSPITAL_COMMUNITY)
Admission: EM | Admit: 2011-08-27 | Discharge: 2011-08-27 | Disposition: A | Discharge status: Home / Self Care | Payer: Medicare Other | Attending: Emergency Medicine | Admitting: Emergency Medicine

## 2011-08-27 DIAGNOSIS — F341 Dysthymic disorder: Secondary | ICD-10-CM | POA: Insufficient documentation

## 2011-08-27 DIAGNOSIS — F988 Other specified behavioral and emotional disorders with onset usually occurring in childhood and adolescence: Secondary | ICD-10-CM | POA: Insufficient documentation

## 2011-08-27 DIAGNOSIS — Z79899 Other long term (current) drug therapy: Secondary | ICD-10-CM | POA: Insufficient documentation

## 2011-08-27 DIAGNOSIS — G40909 Epilepsy, unspecified, not intractable, without status epilepticus: Secondary | ICD-10-CM | POA: Insufficient documentation

## 2011-09-01 ENCOUNTER — Emergency Department (HOSPITAL_COMMUNITY)
Admission: EM | Admit: 2011-09-01 | Discharge: 2011-09-01 | Disposition: A | Discharge status: Home / Self Care | Payer: Medicare Other | Attending: Emergency Medicine | Admitting: Emergency Medicine

## 2011-09-01 DIAGNOSIS — R51 Headache: Secondary | ICD-10-CM | POA: Insufficient documentation

## 2011-09-01 DIAGNOSIS — R0989 Other specified symptoms and signs involving the circulatory and respiratory systems: Secondary | ICD-10-CM | POA: Insufficient documentation

## 2011-09-01 DIAGNOSIS — F17200 Nicotine dependence, unspecified, uncomplicated: Secondary | ICD-10-CM | POA: Insufficient documentation

## 2011-09-01 DIAGNOSIS — Z79899 Other long term (current) drug therapy: Secondary | ICD-10-CM | POA: Insufficient documentation

## 2011-09-01 DIAGNOSIS — G40909 Epilepsy, unspecified, not intractable, without status epilepticus: Secondary | ICD-10-CM | POA: Insufficient documentation

## 2011-09-01 DIAGNOSIS — R0609 Other forms of dyspnea: Secondary | ICD-10-CM | POA: Insufficient documentation

## 2011-09-01 LAB — DIFFERENTIAL
Basophils Absolute: 0.1 10*3/uL (ref 0.0–0.1)
Basophils Relative: 0 % (ref 0–1)
Eosinophils Absolute: 0.6 10*3/uL (ref 0.0–0.7)
Eosinophils Relative: 5 % (ref 0–5)
Lymphocytes Relative: 20 % (ref 12–46)
Lymphs Abs: 2.4 10*3/uL (ref 0.7–4.0)
Monocytes Absolute: 1 10*3/uL (ref 0.1–1.0)
Monocytes Relative: 8 % (ref 3–12)
Neutro Abs: 8.2 10*3/uL — ABNORMAL HIGH (ref 1.7–7.7)
Neutrophils Relative %: 67 % (ref 43–77)

## 2011-09-01 LAB — URINALYSIS, ROUTINE W REFLEX MICROSCOPIC
Glucose, UA: NEGATIVE mg/dL
Hgb urine dipstick: NEGATIVE
Ketones, ur: NEGATIVE mg/dL
Leukocytes, UA: NEGATIVE
Nitrite: NEGATIVE
Protein, ur: NEGATIVE mg/dL
Specific Gravity, Urine: 1.017 (ref 1.005–1.030)
Urobilinogen, UA: 1 mg/dL (ref 0.0–1.0)
pH: 6 (ref 5.0–8.0)

## 2011-09-01 LAB — CBC
HCT: 44.4 % (ref 39.0–52.0)
Hemoglobin: 15.4 g/dL (ref 13.0–17.0)
MCH: 32.2 pg (ref 26.0–34.0)
MCHC: 34.7 g/dL (ref 30.0–36.0)
MCV: 92.9 fL (ref 78.0–100.0)
Platelets: 274 10*3/uL (ref 150–400)
RBC: 4.78 MIL/uL (ref 4.22–5.81)
RDW: 14.1 % (ref 11.5–15.5)
WBC: 12.2 10*3/uL — ABNORMAL HIGH (ref 4.0–10.5)

## 2011-09-01 LAB — BASIC METABOLIC PANEL
BUN: 10 mg/dL (ref 6–23)
CO2: 21 mEq/L (ref 19–32)
Calcium: 9.4 mg/dL (ref 8.4–10.5)
Chloride: 104 mEq/L (ref 96–112)
Creatinine, Ser: 0.58 mg/dL (ref 0.50–1.35)
GFR calc Af Amer: >60 mL/min (ref >60)
GFR calc non Af Amer: >60 mL/min (ref >60)
Glucose, Bld: 104 mg/dL — ABNORMAL HIGH (ref 70–99)
Potassium: 4.5 mEq/L (ref 3.5–5.1)
Sodium: 137 mEq/L (ref 135–145)

## 2011-09-01 LAB — MAGNESIUM: Magnesium: 2.3 mg/dL (ref 1.5–2.5)

## 2011-09-03 LAB — BASIC METABOLIC PANEL
CO2: 26
Calcium: 9.1
GFR calc Af Amer: >60
Potassium: 3.8
Sodium: 137

## 2011-09-06 LAB — CBC
HCT: 42.4
Hemoglobin: 14.6
MCHC: 34.2
MCV: 93
Platelets: 248
RBC: 4.58
RDW: 14.2

## 2011-09-06 LAB — DIFFERENTIAL
Basophils Absolute: 0
Basophils Relative: 1
Basophils Relative: 0
Eosinophils Absolute: 0.5
Eosinophils Absolute: 0.5
Neutro Abs: 5.2
Neutrophils Relative %: 67
Neutrophils Relative %: 52

## 2011-09-06 LAB — I-STAT 8, (EC8 V) (CONVERTED LAB)
Bicarbonate: 27.2 — ABNORMAL HIGH
Chloride: 107
HCT: 45
Hemoglobin: 15.3
Operator id: 294521
pCO2, Ven: 54.5 — ABNORMAL HIGH

## 2011-09-06 LAB — POCT I-STAT CREATININE: Creatinine, Ser: 0.8

## 2011-09-06 LAB — BASIC METABOLIC PANEL
BUN: 11
CO2: 30
CO2: 26
Chloride: 109
Creatinine, Ser: 0.78
GFR calc non Af Amer: >60
Glucose, Bld: 76
Potassium: 4
Sodium: 142

## 2011-09-06 LAB — RAPID URINE DRUG SCREEN, HOSP PERFORMED
Barbiturates: NOT DETECTED
Opiates: NOT DETECTED
Tetrahydrocannabinol: POSITIVE — AB

## 2011-09-06 LAB — PHENOBARBITAL LEVEL: Phenobarbital: <5 — ABNORMAL LOW

## 2011-09-08 LAB — POCT I-STAT, CHEM 8
BUN: 5 — ABNORMAL LOW
Calcium, Ion: 1.15
Chloride: 107
Glucose, Bld: 91
HCT: 44
Potassium: 3.7

## 2011-09-08 LAB — RAPID URINE DRUG SCREEN, HOSP PERFORMED
Cocaine: POSITIVE — AB
Opiates: NOT DETECTED
Tetrahydrocannabinol: POSITIVE — AB

## 2011-09-08 LAB — ETHANOL: Alcohol, Ethyl (B): <5

## 2011-09-09 LAB — COMPREHENSIVE METABOLIC PANEL
Albumin: 3.9
Alkaline Phosphatase: 63
BUN: 11
Creatinine, Ser: 0.75
Glucose, Bld: 90
Potassium: 4.1
Total Protein: 6.5

## 2011-09-09 LAB — RAPID URINE DRUG SCREEN, HOSP PERFORMED
Amphetamines: NOT DETECTED
Barbiturates: NOT DETECTED
Benzodiazepines: NOT DETECTED
Cocaine: POSITIVE — AB
Opiates: NOT DETECTED

## 2011-09-09 LAB — CBC
HCT: 45
Hemoglobin: 15.9
MCHC: 35.3
MCV: 94.4
Platelets: 281
RDW: 13.8

## 2011-09-09 LAB — SALICYLATE LEVEL: Salicylate Lvl: 4.9

## 2011-09-09 LAB — POCT I-STAT, CHEM 8
BUN: 10
Calcium, Ion: 1.28
Chloride: 108
Creatinine, Ser: 0.9
Glucose, Bld: 86
HCT: 49
Hemoglobin: 16.7
Potassium: 4.2
Sodium: 140
TCO2: 24

## 2011-09-09 LAB — DIFFERENTIAL
Basophils Absolute: 0
Basophils Relative: 0
Lymphocytes Relative: 33
Monocytes Relative: 8
Neutro Abs: 4.9
Neutrophils Relative %: 52

## 2011-09-09 LAB — URINALYSIS, ROUTINE W REFLEX MICROSCOPIC
Glucose, UA: NEGATIVE
Hgb urine dipstick: NEGATIVE
Protein, ur: NEGATIVE
Specific Gravity, Urine: 1.004 — ABNORMAL LOW
pH: 7

## 2011-09-09 LAB — ACETAMINOPHEN LEVEL: Acetaminophen (Tylenol), Serum: <10 — ABNORMAL LOW

## 2011-09-09 LAB — PROTIME-INR: INR: 0.9

## 2011-09-09 LAB — APTT: aPTT: 28

## 2011-09-09 LAB — ETHANOL: Alcohol, Ethyl (B): <5

## 2011-09-10 LAB — POCT I-STAT, CHEM 8
BUN: 16
Calcium, Ion: 1.15
Creatinine, Ser: 0.8
Glucose, Bld: 93
TCO2: 20

## 2011-09-16 LAB — RAPID URINE DRUG SCREEN, HOSP PERFORMED
Amphetamines: NOT DETECTED
Barbiturates: NOT DETECTED
Benzodiazepines: POSITIVE — AB
Tetrahydrocannabinol: NOT DETECTED

## 2011-09-16 LAB — DIFFERENTIAL
Basophils Absolute: 0 10*3/uL (ref 0.0–0.1)
Eosinophils Relative: 8 % — ABNORMAL HIGH (ref 0–5)
Lymphocytes Relative: 44 % (ref 12–46)
Lymphs Abs: 3.1 10*3/uL (ref 0.7–4.0)
Neutro Abs: 2.7 10*3/uL (ref 1.7–7.7)
Neutrophils Relative %: 38 % — ABNORMAL LOW (ref 43–77)

## 2011-09-16 LAB — URINALYSIS, ROUTINE W REFLEX MICROSCOPIC
Glucose, UA: NEGATIVE mg/dL
Hgb urine dipstick: NEGATIVE
Ketones, ur: NEGATIVE mg/dL
Protein, ur: NEGATIVE mg/dL

## 2011-09-16 LAB — CBC
MCV: 95.5 fL (ref 78.0–100.0)
Platelets: 225 10*3/uL (ref 150–400)
WBC: 7 10*3/uL (ref 4.0–10.5)

## 2011-09-16 LAB — COMPREHENSIVE METABOLIC PANEL
AST: 76 U/L — ABNORMAL HIGH (ref 0–37)
Albumin: 3.8 g/dL (ref 3.5–5.2)
Chloride: 109 mEq/L (ref 96–112)
Creatinine, Ser: 0.66 mg/dL (ref 0.4–1.5)
GFR calc Af Amer: >60 mL/min (ref >60)
Total Bilirubin: 0.7 mg/dL (ref 0.3–1.2)

## 2011-09-16 LAB — LIPASE, BLOOD: Lipase: 30 U/L (ref 11–59)

## 2011-09-16 LAB — ETHANOL: Alcohol, Ethyl (B): <5 mg/dL (ref 0–10)

## 2011-09-22 LAB — CBC
HCT: 39.8
Hemoglobin: 14.1
MCV: 93.2
RBC: 4.28
WBC: 12.8 — ABNORMAL HIGH

## 2011-09-22 LAB — DIFFERENTIAL
Eosinophils Absolute: 0.7
Eosinophils Relative: 5
Lymphocytes Relative: 35
Lymphs Abs: 4.4 — ABNORMAL HIGH
Monocytes Relative: 7

## 2011-09-22 LAB — BASIC METABOLIC PANEL
Chloride: 110
GFR calc Af Amer: >60
GFR calc non Af Amer: >60
Potassium: 3.5
Sodium: 140

## 2011-10-22 ENCOUNTER — Encounter: Payer: Self-pay | Admitting: Family

## 2011-10-22 ENCOUNTER — Ambulatory Visit (INDEPENDENT_AMBULATORY_CARE_PROVIDER_SITE_OTHER): Payer: Medicare Other | Admitting: Family

## 2011-10-22 DIAGNOSIS — F988 Other specified behavioral and emotional disorders with onset usually occurring in childhood and adolescence: Secondary | ICD-10-CM

## 2011-10-22 DIAGNOSIS — R569 Unspecified convulsions: Secondary | ICD-10-CM

## 2011-10-22 DIAGNOSIS — IMO0001 Reserved for inherently not codable concepts without codable children: Secondary | ICD-10-CM

## 2011-10-22 DIAGNOSIS — E0590 Thyrotoxicosis, unspecified without thyrotoxic crisis or storm: Secondary | ICD-10-CM | POA: Insufficient documentation
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
## 2011-11-27 ENCOUNTER — Encounter (HOSPITAL_COMMUNITY): Payer: Self-pay | Admitting: *Deleted

## 2011-11-27 ENCOUNTER — Emergency Department (HOSPITAL_COMMUNITY)
Admission: EM | Admit: 2011-11-27 | Discharge: 2011-11-27 | Discharge status: Against Medical Advice | Payer: Medicare Other | Attending: Emergency Medicine | Admitting: Emergency Medicine

## 2011-11-27 DIAGNOSIS — R29818 Other symptoms and signs involving the nervous system: Secondary | ICD-10-CM | POA: Insufficient documentation

## 2011-11-27 DIAGNOSIS — R4589 Other symptoms and signs involving emotional state: Secondary | ICD-10-CM | POA: Insufficient documentation

## 2011-11-27 DIAGNOSIS — F1910 Other psychoactive substance abuse, uncomplicated: Secondary | ICD-10-CM | POA: Insufficient documentation

## 2011-11-27 LAB — DIFFERENTIAL
Basophils Absolute: 0 10*3/uL (ref 0.0–0.1)
Basophils Relative: 0 % (ref 0–1)
Lymphocytes Relative: 15 % (ref 12–46)
Monocytes Absolute: 0.9 10*3/uL (ref 0.1–1.0)
Monocytes Relative: 6 % (ref 3–12)
Neutro Abs: 13.4 10*3/uL — ABNORMAL HIGH (ref 1.7–7.7)
Neutrophils Relative %: 79 % — ABNORMAL HIGH (ref 43–77)

## 2011-11-27 LAB — COMPREHENSIVE METABOLIC PANEL
AST: 21 U/L (ref 0–37)
BUN: 20 mg/dL (ref 6–23)
CO2: 23 mEq/L (ref 19–32)
Calcium: 10.5 mg/dL (ref 8.4–10.5)
Chloride: 103 mEq/L (ref 96–112)
Creatinine, Ser: 0.93 mg/dL (ref 0.50–1.35)
GFR calc non Af Amer: >90 mL/min (ref >90)
Total Bilirubin: 0.1 mg/dL — ABNORMAL LOW (ref 0.3–1.2)

## 2011-11-27 LAB — CBC
HCT: 42 % (ref 39.0–52.0)
MCH: 32.8 pg (ref 26.0–34.0)
MCV: 93.1 fL (ref 78.0–100.0)
Platelets: 312 10*3/uL (ref 150–400)
RDW: 13.7 % (ref 11.5–15.5)
WBC: 17.9 10*3/uL — ABNORMAL HIGH (ref 4.0–10.5)

## 2011-11-27 LAB — URINALYSIS, ROUTINE W REFLEX MICROSCOPIC
Bilirubin Urine: NEGATIVE
Glucose, UA: NEGATIVE mg/dL
Hgb urine dipstick: NEGATIVE
Specific Gravity, Urine: 1.03 (ref 1.005–1.030)
pH: 5.5 (ref 5.0–8.0)

## 2011-11-27 LAB — URINE MICROSCOPIC-ADD ON

## 2011-11-27 LAB — RAPID URINE DRUG SCREEN, HOSP PERFORMED
Cocaine: POSITIVE — AB
Opiates: NOT DETECTED

## 2011-11-27 LAB — ETHANOL: Alcohol, Ethyl (B): <11 mg/dL (ref 0–11)

## 2011-11-27 MED ORDER — ONDANSETRON HCL 4 MG PO TABS: 4.0000 mg | ORAL_TABLET | Freq: Three times a day (TID) | ORAL | Status: DC | PRN

## 2011-11-27 MED ORDER — LORAZEPAM 1 MG PO TABS
1.0000 mg | ORAL_TABLET | Freq: Three times a day (TID) | ORAL | Status: DC | PRN
  Administered 2011-11-27: 1 mg via ORAL
  Filled 2011-11-27: qty 1

## 2011-11-27 MED ORDER — ALUM & MAG HYDROXIDE-SIMETH 200-200-20 MG/5ML PO SUSP
30.0000 mL | ORAL | Status: DC | PRN
  Administered 2011-11-27: 30 mL via ORAL
  Filled 2011-11-27: qty 30

## 2011-11-27 MED ORDER — IBUPROFEN 600 MG PO TABS: 600.0000 mg | ORAL_TABLET | Freq: Three times a day (TID) | ORAL | Status: DC | PRN

## 2011-11-27 MED ORDER — NICOTINE 21 MG/24HR TD PT24
21.0000 mg | MEDICATED_PATCH | Freq: Every day | TRANSDERMAL | Status: DC
  Administered 2011-11-27: 21 mg via TRANSDERMAL
  Filled 2011-11-27: qty 1

## 2011-11-27 MED ORDER — ZOLPIDEM TARTRATE 10 MG PO TABS: 10.0000 mg | ORAL_TABLET | Freq: Every evening | ORAL | Status: DC | PRN

## 2011-11-27 MED ORDER — ACETAMINOPHEN 325 MG PO TABS: 650.0000 mg | ORAL_TABLET | ORAL | Status: DC | PRN

## 2011-11-27 NOTE — ED Notes (Addendum)
Pt in stating he has been on a drinking binge, states he hasn't been taking his medication and had two seizures today, denies SI/HI, states he wants help with detox and getting back on his medications, states he is wanting to start back on klonopin

## 2011-11-27 NOTE — ED Notes (Signed)
Pt continues to decline to stay for eval and treatment and  Verbalized understanding of the risks and benefits of staying.

## 2011-11-27 NOTE — ED Notes (Signed)
Dr Lynelle Doctor aware of pt's request for additional medications-no additional meds at this time

## 2011-11-27 NOTE — ED Notes (Signed)
Charge nurse and dr I knapp aware that pt wants to leave prior to eval.--sign pt out as AMA if he is not willing to stay
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
## 2011-11-27 NOTE — ED Provider Notes (Signed)
History     CSN: [id number] Arrival date & time: 11/27/2011  2:39 PM   First MD Initiated Contact with Patient 11/27/11 1536      Chief Complaint  Patient presents with  . Medical Clearance    (Consider location/radiation/quality/duration/timing/severity/associated sxs/prior treatment) HPI  Patient is a frequent ER visitor for many years he has a history of substance abuse. He states he has been abusing alcohol stating he's drinking 1 pint to 1/5 of never clear every couple days. He states he wants detox. Patient also states he wants to remain on his narcotic medication and his clonazepam which he states he takes for seizures. Of note patient called out to the triage nurse on her he was having a grand mal seizure while he was waiting to be seen. Patient cannot tell me who his medical doctor is. He states he saw Dr. Ronne Binning a couple months ago. Looking at the [facility] controlled substances report he shouldn't has been getting medications in [place]. His last prescription was filled November 11 #90 hydrocodone and# 90 clonazepam 2 mg from a [facility] in [place], however before that his last prescriptions were filled in September. During his prior charts patient was seen in October by Dr. Peggyann Juba he refused to prescribe him narcotics and benzos. Of note  at least 3 pharmacies will not fill prescriptions from this patient anymore per the [facility]. Patient states he wants detox.  Primary care physician unknown  Past Medical History  Diagnosis Date  . Depression   . GERD (gastroesophageal reflux disease)   . Migraine   . Ulcer     hx of stomach ulcer  . 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  [name] History of meningitis 2000   panic attacks Polysubstance abuse  Past Surgical History  Procedure Date  . Brain surgery 2000    Removed growth?    Family History  Problem Relation Age of Onset  . Arthritis Mother   . COPD Mother    . Emphysema Mother   . Heart disease Mother   . Depression Father   . Diabetes Maternal Aunt   . Diabetes Maternal Uncle     History  Substance Use Topics  . Smoking status: Current Everyday Smoker  . Smokeless tobacco: Never Used   Comment: 4 cigarettes daily  . Alcohol Use: Yes    unemployed    Review of Systems  All other systems reviewed and are negative.    Allergies  Phenobarbital; Phenytoin; and Topamax  Home Medications   Current Outpatient Rx  Name Route Sig Dispense Refill  . ASPIRIN-ACETAMINOPHEN-CAFFEINE 500-325-65 MG PO PACK Oral Take 1 packet by mouth every 6 (six) hours. headache     . DULOXETINE HCL 60 MG PO CPEP Oral Take 60 mg by mouth daily.      . OXYCODONE HCL 15 MG PO TABS Oral Take 15 mg by mouth every 4 (four) hours as needed. pain     . RANITIDINE HCL 150 MG PO TABS Oral Take 300 mg by mouth 2 (two) times daily.      [name] CLONAZEPAM 2 MG PO TABS Oral Take 2 mg by mouth 2 (two) times daily as needed. Panic attacks       BP 96/50  Pulse 91  Temp(Src) 97.6 F (36.4 C) (Oral)  Resp 16  SpO2 99% Mild hypotension consistent with patient's body habitus.  Physical Exam  Nursing note and vitals reviewed. Constitutional: He  is oriented to person, place, and time.  Non-toxic appearance. He does not appear ill. No distress.       Small underweight ill kept male  HENT:  Head: Normocephalic and atraumatic.  Right Ear: External ear normal.  Left Ear: External ear normal.  Nose: Nose normal. No mucosal edema or rhinorrhea.  Mouth/Throat: Oropharynx is clear and moist and mucous membranes are normal. No dental abscesses or uvula swelling.  Eyes: Conjunctivae and EOM are normal. Pupils are equal, round, and reactive to light.  Neck: Normal range of motion and full passive range of motion without pain. Neck supple.  Cardiovascular: Normal rate, regular rhythm and normal heart sounds.  Exam reveals no gallop and no friction rub.   No murmur  heard. Pulmonary/Chest: Effort normal and breath sounds normal. No respiratory distress. He has no wheezes. He has no rhonchi. He has no rales. He exhibits no tenderness and no crepitus.  Abdominal: Soft. Normal appearance and bowel sounds are normal. He exhibits no distension. There is no tenderness. There is no rebound and no guarding.  Musculoskeletal: Normal range of motion. He exhibits no edema and no tenderness.       Moves all extremities well.   Neurological: He is alert and oriented to person, place, and time. He has normal strength. No cranial nerve deficit.  Skin: Skin is warm, dry and intact. No rash noted. No erythema. No pallor.  Psychiatric: His speech is normal and behavior is normal. His mood appears not anxious.       Patient has underlying anger.    ED Course  Procedures (including critical care time)  I've explained to patient if he wants to be admitted for detox he will not be maintained on any narcotic medication. I will put him on a regular schedule of benzos however he will not get any extra doses.  17:46 Karen Kitchens, ACT will see patient.   1900 site nursing staff state patient is wanting more pain and benzodiazepines than is written for him and wants to sign out AMA if he cannot get them. My feeling is patient's going to detox he should be detoxed from all of his substance abuse.  Left AMA before having act evaluation  Results for orders placed during the hospital encounter of 11/27/11  CBC      Component Value Range   WBC 17.9 (*) 4.0 - 10.5 (K/uL)   RBC 4.51  4.22 - 5.81 (MIL/uL)   Hemoglobin 14.8  13.0 - 17.0 (g/dL)   HCT 40.9  81.1 - 91.4 (%)   MCV 93.1  78.0 - 100.0 (fL)   MCH 32.8  26.0 - 34.0 (pg)   MCHC 35.2  30.0 - 36.0 (g/dL)   RDW 78.2  95.6 - 21.3 (%)   Platelets 312  150 - 400 (K/uL)  COMPREHENSIVE METABOLIC PANEL      Component Value Range   Sodium 137  135 - 145 (mEq/L)   Potassium 3.9  3.5 - 5.1 (mEq/L)   Chloride 103  96 - 112 (mEq/L)   CO2  23  19 - 32 (mEq/L)   Glucose, Bld 130 (*) 70 - 99 (mg/dL)   BUN 20  6 - 23 (mg/dL)   Creatinine, Ser 0.86  0.50 - 1.35 (mg/dL)   Calcium 57.8  8.4 - 10.5 (mg/dL)   Total Protein 7.3  6.0 - 8.3 (g/dL)   Albumin 4.1  3.5 - 5.2 (g/dL)   AST 21  0 - 37 (U/L)   ALT  16  0 - 53 (U/L)   Alkaline Phosphatase 84  39 - 117 (U/L)   Total Bilirubin 0.1 (*) 0.3 - 1.2 (mg/dL)   GFR calc non Af Amer >90  >90 (mL/min)   GFR calc Af Amer >90  >90 (mL/min)  ETHANOL      Component Value Range   Alcohol, Ethyl (B) <11  0 - 11 (mg/dL)  URINE RAPID DRUG SCREEN (HOSP PERFORMED)      Component Value Range   Opiates NONE DETECTED  NONE DETECTED    Cocaine POSITIVE (*) NONE DETECTED    Benzodiazepines POSITIVE (*) NONE DETECTED    Amphetamines NONE DETECTED  NONE DETECTED    Tetrahydrocannabinol POSITIVE (*) NONE DETECTED    Barbiturates NONE DETECTED  NONE DETECTED   URINALYSIS, ROUTINE W REFLEX MICROSCOPIC      Component Value Range   Color, Urine YELLOW  YELLOW    APPearance CLEAR  CLEAR    Specific Gravity, Urine 1.030  1.005 - 1.030    pH 5.5  5.0 - 8.0    Glucose, UA NEGATIVE  NEGATIVE (mg/dL)   Hgb urine dipstick NEGATIVE  NEGATIVE    Bilirubin Urine NEGATIVE  NEGATIVE    Ketones, ur TRACE (*) NEGATIVE (mg/dL)   Protein, ur NEGATIVE  NEGATIVE (mg/dL)   Urobilinogen, UA 1.0  0.0 - 1.0 (mg/dL)   Nitrite NEGATIVE  NEGATIVE    Leukocytes, UA SMALL (*) NEGATIVE   URINE MICROSCOPIC-ADD ON      Component Value Range   WBC, UA 3-6  <3 (WBC/hpf)   Bacteria, UA FEW (*) RARE    Crystals CA OXALATE CRYSTALS (*) NEGATIVE    Urine-Other MUCOUS PRESENT    DIFFERENTIAL      Component Value Range   Neutrophils Relative 79 (*) 43 - 77 (%)   Neutro Abs 13.4 (*) 1.7 - 7.7 (K/uL)   Lymphocytes Relative 15  12 - 46 (%)   Lymphs Abs 2.6  0.7 - 4.0 (K/uL)   Monocytes Relative 6  3 - 12 (%)   Monocytes Absolute 0.9  0.1 - 1.0 (K/uL)   Eosinophils Relative 0  0 - 5 (%)   Eosinophils Absolute 0.0  0.0 - 0.7  (K/uL)   Basophils Relative 0  0 - 1 (%)   Basophils Absolute 0.0  0.0 - 0.1 (K/uL)   Laboratory interpretation leukocytosis, positive drug screen, otherwise normal    Diagnoses that have been ruled out:  Diagnoses that are still under consideration:  Final diagnoses:  Polysubstance abuse     Disposition patient left AMA  Devoria Albe, MD, FACEP   MDM          Ward Givens, MD 11/27/11 2244

## 2011-11-27 NOTE — ED Notes (Signed)
Belongings returned

## 2011-11-29 ENCOUNTER — Ambulatory Visit: Payer: Medicare Other | Admitting: Family

## 2011-11-29 ENCOUNTER — Telehealth: Payer: Self-pay | Admitting: Family

## 2011-11-29 ENCOUNTER — Emergency Department (HOSPITAL_COMMUNITY)
Admission: EM | Admit: 2011-11-29 | Discharge: 2011-11-29 | Disposition: A | Discharge status: Home / Self Care | Payer: Medicare Other | Attending: Emergency Medicine | Admitting: Emergency Medicine

## 2011-11-29 ENCOUNTER — Encounter (HOSPITAL_COMMUNITY): Payer: Self-pay | Admitting: *Deleted

## 2011-11-29 ENCOUNTER — Emergency Department (HOSPITAL_COMMUNITY): Payer: Medicare Other

## 2011-11-29 DIAGNOSIS — R569 Unspecified convulsions: Secondary | ICD-10-CM | POA: Insufficient documentation

## 2011-11-29 DIAGNOSIS — S82143A Displaced bicondylar fracture of unspecified tibia, initial encounter for closed fracture: Secondary | ICD-10-CM

## 2011-11-29 DIAGNOSIS — X500XXA Overexertion from strenuous movement or load, initial encounter: Secondary | ICD-10-CM | POA: Insufficient documentation

## 2011-11-29 DIAGNOSIS — M7989 Other specified soft tissue disorders: Secondary | ICD-10-CM | POA: Insufficient documentation

## 2011-11-29 DIAGNOSIS — W010XXA Fall on same level from slipping, tripping and stumbling without subsequent striking against object, initial encounter: Secondary | ICD-10-CM | POA: Insufficient documentation

## 2011-11-29 DIAGNOSIS — K219 Gastro-esophageal reflux disease without esophagitis: Secondary | ICD-10-CM | POA: Insufficient documentation

## 2011-11-29 DIAGNOSIS — S82109A Unspecified fracture of upper end of unspecified tibia, initial encounter for closed fracture: Secondary | ICD-10-CM | POA: Insufficient documentation

## 2011-11-29 DIAGNOSIS — M25569 Pain in unspecified knee: Secondary | ICD-10-CM | POA: Insufficient documentation

## 2011-11-29 MED ORDER — OXYCODONE-ACETAMINOPHEN 10-325 MG PO TABS: 1.0000 | ORAL_TABLET | ORAL | Status: DC | PRN

## 2011-11-29 MED ORDER — HYDROMORPHONE HCL PF 1 MG/ML IJ SOLN
1.0000 mg | Freq: Once | INTRAMUSCULAR | Status: AC
  Administered 2011-11-29: 1 mg via INTRAVENOUS
  Filled 2011-11-29: qty 1

## 2011-11-29 MED ORDER — TRAMADOL HCL 50 MG PO TABS
50.0000 mg | ORAL_TABLET | Freq: Once | ORAL | Status: AC
  Administered 2011-11-29: 50 mg via ORAL
  Filled 2011-11-29: qty 1

## 2011-11-29 MED ORDER — GI COCKTAIL ~~LOC~~
30.0000 mL | Freq: Once | ORAL | Status: AC
  Administered 2011-11-29: 30 mL via ORAL
  Filled 2011-11-29: qty 30

## 2011-11-29 NOTE — Telephone Encounter (Signed)
No klonopin refill.

## 2011-11-29 NOTE — ED Notes (Signed)
ZOX:[id number]<AV> Expected date:11/29/11<BR> Expected time:12:45 AM<BR> Means of arrival:Ambulance<BR> Comments:<BR> M211 - 40yoM knee pain

## 2011-11-29 NOTE — ED Provider Notes (Signed)
Medical screening examination/treatment/procedure(s) were performed by non-physician practitioner and as supervising physician I was immediately available for consultation/collaboration.  Iley Breeden M Thia Olesen, MD 11/29/11 0422 

## 2011-11-29 NOTE — Telephone Encounter (Signed)
Patient canceled appointment this afternoon due to lack of transportation but would like a refill of klonopin.

## 2011-11-29 NOTE — ED Provider Notes (Signed)
History     CSN: [id number] Arrival date & time: 11/29/2011  1:01 AM   First MD Initiated Contact with Patient 11/29/11 0120      Chief Complaint  Patient presents with  . Knee Pain     HPI  History provided by the patient. Pt is 40 year old male with history of depression, seizure disorder, polysubstance abuse, alcohol abuse, who presents today with complaints of right knee injury and pain. Patient has multiple prior ED visits including visit yesterday for alcohol abuse. Today patient reports chasing after his dog in the yard shortly prior to arrival. States that he may have stepped in a pothole or tripped on something causing him to twist his right knee and fall to the ground. Patient complains of swelling and pain to the knee with inability to walk. Patient called EMS for transport to the ED and further evaluation. Pain is worse with movements. Pain radiates to proximal calf area. Patient denies numbness or tingling in foot. Patient has no other significant past medical history.   Past Medical History  Diagnosis Date  . Depression   . GERD (gastroesophageal reflux disease)   . Migraine   . Ulcer     hx of stomach ulcer  . 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  [name] History of meningitis 2000    Past Surgical History  Procedure Date  . Brain surgery 2000    Removed growth?    Family History  Problem Relation Age of Onset  . Arthritis Mother   . COPD Mother   . Emphysema Mother   . Heart disease Mother   . Depression Father   . Diabetes Maternal Aunt   . Diabetes Maternal Uncle     History  Substance Use Topics  . Smoking status: Current Everyday Smoker  . Smokeless tobacco: Never Used   Comment: 4 cigarettes daily  . Alcohol Use: No      Review of Systems  All other systems reviewed and are negative.    Allergies  Phenobarbital; Phenytoin; and Topamax  Home Medications   Current Outpatient Rx  Name Route Sig  Dispense Refill  . CLONAZEPAM 2 MG PO TABS Oral Take 2 mg by mouth 2 (two) times daily as needed. Panic attacks    . DULOXETINE HCL 60 MG PO CPEP Oral Take 60 mg by mouth daily.      . OXYCODONE HCL 10 MG PO TABS Oral Take 10 mg by mouth every 8 (eight) hours as needed. pain     . RANITIDINE HCL 150 MG PO TABS Oral Take 300 mg by mouth 2 (two) times daily.      [name] TIZANIDINE HCL 4 MG PO TABS Oral Take 4 mg by mouth every 8 (eight) hours as needed.        BP 110/82  Pulse 94  Temp(Src) 98 F (36.7 C) (Oral)  Resp 17  Ht 5\' 5"  (1.651 m)  Wt 135 lb (61.236 kg)  BMI 22.47 kg/m2  SpO2 96%  Physical Exam  Nursing note and vitals reviewed. Constitutional: He is oriented to person, place, and time. He appears well-developed and well-nourished. No distress.  HENT:  Head: Normocephalic and atraumatic.       No signs of trauma. No battle sign or raccoon eyes.  Eyes: EOM are normal. Pupils are equal, round, and reactive to light.  Neck: Neck supple.       No cervical midline tenderness  Cardiovascular: Normal rate,  regular rhythm and normal heart sounds.   No murmur heard. Pulmonary/Chest: Effort normal and breath sounds normal.  Musculoskeletal:       Exam limited secondary to patient cooperation and comfort. Patient has mild to moderate diffuse swelling of right knee. No gross deformity. Patient has reduced range of motion secondary to pain. Patient able to fully extend and hold leg above the bed. Normal distal pedal pulses and sensations in toes. Normal Thompson's test. No mass in calf or thigh.  Neurological: He is alert and oriented to person, place, and time. No sensory deficit.  Skin: Skin is warm.  Psychiatric: He has a normal mood and affect. His behavior is normal.    ED Course  Procedures (including critical care time)  Labs Reviewed - No data to display Dg Knee Complete 4 Views Right  11/29/2011  **ADDENDUM** CREATED: 11/29/2011 03:30:41  *RADIOLOGY REPORT*  Clinical Data:  Lateral knee pain status post twisting injury.  RIGHT KNEE - COMPLETE 4+ VIEW  Comparison: None.  Findings: Mildly displaced fracture of the medial tibial plateau. Moderate joint effusion. Patella is high-riding, may be exaggerated by positioning. No additional fracture or dislocation identified.  IMPRESSION: Medial tibial plateau fracture.  **END ADDENDUM** SIGNED BY: [name] Mcdonald, M.D.   11/29/2011  *RADIOLOGY REPORT*  Clinical Data: Lateral knee pain status post twisting injury.  RIGHT KNEE - COMPLETE 4+ VIEW  Comparison: None.  Findings: Mildly displaced fracture of the lateral tibial plateau. Moderate joint effusion.  Patella is high-riding, may be exaggerated by positioning.  No additional fracture or dislocation identified.  IMPRESSION: Lateral tibial plateau fracture.  Original Report Authenticated By: [name] Mcdonald, M.D.     1. Tibial plateau fracture       MDM  2:10 AM patient seen and evaluated. Patient in no acute distress.   3:45 AM patient discussed with Dr. Myra Mcdonald on call for Dr. Charlann Mcdonald with orthopedics. He recommends a knee immobilizer and crutches with nonweightbearing right extremity. He is able to followup with patient later today in office.     [name] Mcdonald, [place] 11/29/11 [phone]

## 2011-11-29 NOTE — Telephone Encounter (Signed)
Notified pt that refill can be discussed at appt tomorrow. Pt voices understanding and states he may be a little late because he is relying on public transportation.

## 2011-11-29 NOTE — ED Notes (Signed)
Pt comes from [address] where he lives.  Pt was at said home early this morning when he decided he should chase his dog, an akita/pit bull mix, around his back yard.  The pt managed to find a hole in his back yard and step into that hole.  Pt is complaining of right knee pain.  Pt's right knee is swollen, but this is per his norm.  There were no other victims.

## 2011-11-30 ENCOUNTER — Ambulatory Visit (INDEPENDENT_AMBULATORY_CARE_PROVIDER_SITE_OTHER): Payer: Medicare Other | Admitting: Family

## 2011-11-30 ENCOUNTER — Encounter: Payer: Self-pay | Admitting: Family

## 2011-11-30 DIAGNOSIS — R569 Unspecified convulsions: Secondary | ICD-10-CM

## 2011-11-30 DIAGNOSIS — S82143A Displaced bicondylar fracture of unspecified tibia, initial encounter for closed fracture: Secondary | ICD-10-CM | POA: Insufficient documentation

## 2011-11-30 DIAGNOSIS — S82109A Unspecified fracture of upper end of unspecified tibia, initial encounter for closed fracture: Secondary | ICD-10-CM

## 2011-11-30 DIAGNOSIS — F411 Generalized anxiety disorder: Secondary | ICD-10-CM

## 2011-11-30 MED ORDER — RANITIDINE HCL 150 MG PO TABS: 300.0000 mg | ORAL_TABLET | Freq: Two times a day (BID) | ORAL | Status: DC

## 2011-11-30 MED ORDER — DULOXETINE HCL 60 MG PO CPEP: 60.0000 mg | ORAL_CAPSULE | Freq: Every day | ORAL | Status: DC

## 2011-11-30 MED ORDER — LEVETIRACETAM 500 MG PO TABS: 500.0000 mg | ORAL_TABLET | Freq: Two times a day (BID) | ORAL | Status: DC

## 2011-11-30 MED ORDER — CEPHALEXIN 500 MG PO CAPS: 500.0000 mg | ORAL_CAPSULE | Freq: Four times a day (QID) | ORAL | Status: AC

## 2011-11-30 NOTE — Progress Notes (Signed)
Subjective:    Patient ID: [name] Mcdonald, male    DOB: [birth year], 40 y.o.   MRN: [id number]  HPI  Mr.  Mcdonald is a 40 yr old male who presents today for ED follow up.    He was seen in the ED 2 nights ago following a fall and was diagnosed with a tibeal plateau fracture.  He reports that the ED doctor had referred him to someone closer to his home.  (Dr. Darrelyn Hillock) He is going to try to get in to see him today.     Review of Systems See HPI  Past Medical History  Diagnosis Date  . Depression   . GERD (gastroesophageal reflux disease)   . Migraine   . Ulcer     hx of stomach ulcer  . 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  [name] History of meningitis 2000    History   Social History  . Marital Status: Single    Spouse Name: N/A    Number of Children: N/A  . Years of Education: N/A   Occupational History  . Not on file.   Social History Main Topics  . Smoking status: Former Smoker    Quit date: 05/31/2011  . Smokeless tobacco: Never Used  . Alcohol Use: No  . Drug Use: No  . Sexually Active: Not on file   Other Topics Concern  . Not on file   Social History Narrative   + smoker now 3 cigarettes a day.Marital status is single.No children, lives with his mom.    Past Surgical History  Procedure Date  . Brain surgery 2000    Removed growth?    Family History  Problem Relation Age of Onset  . Arthritis Mother   . COPD Mother   . Emphysema Mother   . Heart disease Mother   . Depression Father   . Diabetes Maternal Aunt   . Diabetes Maternal Uncle     Allergies  Allergen Reactions  . Phenobarbital Other (See Comments)    Bad dreams  . Phenytoin Itching  . Topamax Other (See Comments)    Vertigo, lack of focus.    Current Outpatient Prescriptions on File Prior to Visit  Medication Sig Dispense Refill  . clonazePAM (KLONOPIN) 2 MG tablet Take 2 mg by mouth 2 (two) times daily as needed. Panic attacks      . Oxycodone  HCl 10 MG TABS Take 10 mg by mouth every 8 (eight) hours as needed. pain       . tiZANidine (ZANAFLEX) 4 MG tablet Take 4 mg by mouth every 8 (eight) hours as needed.          BP 110/68  Pulse 96  Temp(Src) 98.3 F (36.8 C) (Oral)  Resp 16       Objective:   Physical Exam  Constitutional: He appears well-developed and well-nourished. No distress.  Cardiovascular: Normal rate and regular rhythm.   No murmur heard. Pulmonary/Chest: Effort normal and breath sounds normal. No respiratory distress. He has no wheezes. He has no rales. He exhibits no tenderness.  Musculoskeletal:       + swelling of the right knee.  Swelling extends down the right calf.  No significant warmth or erythema.   Psychiatric: Thought content normal. His mood appears anxious. His speech is not rapid and/or pressured. He is agitated. Cognition and memory are normal. He expresses inappropriate judgment.          Assessment &  Plan:

## 2011-11-30 NOTE — Patient Instructions (Addendum)
You will be contacted about your referral to Dr. Modesto Charon and to pain mangagment. Please follow up with Orthopedics today.  Follow up in 1 week.

## 2011-11-30 NOTE — Assessment & Plan Note (Addendum)
Per ED- he is to call Dr. Jeannetta Ellis office to be evaluated.  He is requesting pain medication. He was detoxed for ETOH recently and drug tox in the ED was + for cocaine, benzo's and marijuana.  I declined pain medication for him.  Due to extensive swelling I have added empiric keflex.  ED records are reviewed.

## 2011-11-30 NOTE — Assessment & Plan Note (Signed)
Refill cymbalta 

## 2011-11-30 NOTE — Assessment & Plan Note (Signed)
I declined to refill his benzos.  Will plan to treat with Keppra and refer to Neuro for further evaluation and care of his hx of seizure disorder. Case was discussed with Dr. Rodena Medin who concurs with treatment plan.

## 2011-12-01 ENCOUNTER — Emergency Department (HOSPITAL_COMMUNITY)
Admission: EM | Admit: 2011-12-01 | Discharge: 2011-12-01 | Disposition: A | Discharge status: Home / Self Care | Payer: Medicare Other | Attending: Emergency Medicine | Admitting: Emergency Medicine

## 2011-12-01 ENCOUNTER — Encounter (HOSPITAL_COMMUNITY): Payer: Self-pay | Admitting: Emergency Medicine

## 2011-12-01 ENCOUNTER — Emergency Department (HOSPITAL_COMMUNITY): Payer: Medicare Other

## 2011-12-01 ENCOUNTER — Encounter: Payer: Self-pay | Admitting: Neurology

## 2011-12-01 DIAGNOSIS — W010XXA Fall on same level from slipping, tripping and stumbling without subsequent striking against object, initial encounter: Secondary | ICD-10-CM | POA: Insufficient documentation

## 2011-12-01 DIAGNOSIS — M7989 Other specified soft tissue disorders: Secondary | ICD-10-CM

## 2011-12-01 DIAGNOSIS — S82143A Displaced bicondylar fracture of unspecified tibia, initial encounter for closed fracture: Secondary | ICD-10-CM

## 2011-12-01 DIAGNOSIS — R569 Unspecified convulsions: Secondary | ICD-10-CM | POA: Insufficient documentation

## 2011-12-01 DIAGNOSIS — S82109A Unspecified fracture of upper end of unspecified tibia, initial encounter for closed fracture: Secondary | ICD-10-CM | POA: Insufficient documentation

## 2011-12-01 DIAGNOSIS — X500XXA Overexertion from strenuous movement or load, initial encounter: Secondary | ICD-10-CM | POA: Insufficient documentation

## 2011-12-01 DIAGNOSIS — K219 Gastro-esophageal reflux disease without esophagitis: Secondary | ICD-10-CM | POA: Insufficient documentation

## 2011-12-01 DIAGNOSIS — M79609 Pain in unspecified limb: Secondary | ICD-10-CM | POA: Insufficient documentation

## 2011-12-01 MED ORDER — OXYCODONE-ACETAMINOPHEN 5-325 MG PO TABS: 2.0000 | ORAL_TABLET | ORAL | Status: AC | PRN

## 2011-12-01 MED ORDER — OXYCODONE-ACETAMINOPHEN 5-325 MG PO TABS
2.0000 | ORAL_TABLET | Freq: Once | ORAL | Status: AC
  Administered 2011-12-01: 2 via ORAL
  Filled 2011-12-01: qty 2

## 2011-12-01 NOTE — Progress Notes (Signed)
Right lower extremity venous duplex completed.  Preliminary report is negative for DVT, SVT, or a Baker's cyst in the right leg.  No evidence of DVT in the left common femoral vein. Smiley Houseman 12/01/2011, 10:20 AM

## 2011-12-01 NOTE — ED Notes (Signed)
Patient transported to X-ray 

## 2011-12-01 NOTE — ED Provider Notes (Signed)
History     CSN: [id number] Arrival date & time: 12/01/2011  9:26 AM   First MD Initiated Contact with Patient 12/01/11 [phone]      Chief Complaint  Patient presents with  . Leg Injury    (Consider location/radiation/quality/duration/timing/severity/associated sxs/prior treatment) HPI Comments: Patient presents with pain in his right leg raise had for the past 3 days. He was seen here on the 17th after falling and diagnosed with a right tibial plateau fracture. He has not follow up with orthopedics. He saw a nurse practitioner in internal medicine yesterday who "took away my brace". He states he is out of his Percocet and trouble walking because of the pain in his leg.  He states he's been using crutches but does not have them with him.  The history is provided by the patient.    Past Medical History  Diagnosis Date  . Depression   . GERD (gastroesophageal reflux disease)   . Migraine   . Ulcer     hx of stomach ulcer  . 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  [name] History of meningitis 2000    Past Surgical History  Procedure Date  . Brain surgery 2000    Removed growth?    Family History  Problem Relation Age of Onset  . Arthritis Mother   . COPD Mother   . Emphysema Mother   . Heart disease Mother   . Depression Father   . Diabetes Maternal Aunt   . Diabetes Maternal Uncle     History  Substance Use Topics  . Smoking status: Former Smoker    Quit date: 05/31/2011  . Smokeless tobacco: Never Used  . Alcohol Use: No      Review of Systems  Constitutional: Negative for fever, activity change and appetite change.  Respiratory: Negative for cough, chest tightness and shortness of breath.   Gastrointestinal: Negative for nausea, vomiting and abdominal pain.  Genitourinary: Negative for dysuria and hematuria.  Musculoskeletal: Positive for myalgias, joint swelling, arthralgias and gait problem. Negative for back pain.  Skin:  Positive for rash.  Neurological: Negative for dizziness and headaches.    Allergies  Phenobarbital; Phenytoin; Tegretol; and Topamax  Home Medications   Current Outpatient Rx  Name Route Sig Dispense Refill  . CLONAZEPAM 2 MG PO TABS Oral Take 2 mg by mouth 2 (two) times daily as needed. Panic attacks    . DULOXETINE HCL 60 MG PO CPEP Oral Take 1 capsule (60 mg total) by mouth daily. 30 capsule 2  . ADULT MULTIVITAMIN W/MINERALS CH Oral Take 1 tablet by mouth daily.      . OXYCODONE HCL 10 MG PO TABS Oral Take 10 mg by mouth every 8 (eight) hours as needed. pain     . TIZANIDINE HCL 4 MG PO TABS Oral Take 4 mg by mouth every 8 (eight) hours as needed.      . CEPHALEXIN 500 MG PO CAPS Oral Take 1 capsule (500 mg total) by mouth 4 (four) times daily. 40 capsule 0  . LEVETIRACETAM 500 MG PO TABS Oral Take 1 tablet (500 mg total) by mouth every 12 (twelve) hours. 60 tablet 2  . OXYCODONE-ACETAMINOPHEN 5-325 MG PO TABS Oral Take 2 tablets by mouth every 4 (four) hours as needed for pain. 6 tablet 0  . RANITIDINE HCL 150 MG PO TABS Oral Take 2 tablets (300 mg total) by mouth 2 (two) times daily. 120 tablet 2  BP 113/76  Pulse 87  Temp(Src) 98.7 F (37.1 C) (Oral)  Resp 18  SpO2 99%  Physical Exam  Constitutional: He is oriented to person, place, and time. He appears well-developed and well-nourished. No distress.  HENT:  Head: Normocephalic and atraumatic.  Mouth/Throat: Oropharynx is clear and moist. No oropharyngeal exudate.  Eyes: Conjunctivae are normal. Pupils are equal, round, and reactive to light.  Neck: Normal range of motion. Neck supple.  Cardiovascular: Normal rate, regular rhythm and normal heart sounds.   Pulmonary/Chest: Effort normal and breath sounds normal. No respiratory distress.  Abdominal: Soft. There is no tenderness. There is no rebound and no guarding.  Musculoskeletal: He exhibits tenderness.       There is diffuse swelling to right lower extremity.  There is tenderness to palpation over the tibial plateau with reduced range of motion. +2 DP and PT pulse. There is ecchymosis and swelling over the medial malleolus.  Neurological: He is alert and oriented to person, place, and time. No cranial nerve deficit.  Skin: Skin is warm.    ED Course  Procedures (including critical care time)  Labs Reviewed - No data to display Dg Ankle Complete Right  12/01/2011  *RADIOLOGY REPORT*  Clinical Data: Repeated falls.  Pain.  RIGHT ANKLE - COMPLETE 3+ VIEW  Comparison: Plain films 05/18/2011.  Findings: There is some soft tissue swelling about the ankle.  No fracture, dislocation or joint effusion.  IMPRESSION: Swelling without underlying bony or joint abnormality.  Original Report Authenticated By: Bernadene Bell. D'ALESSIO, M.D.   Dg Knee Complete 4 Views Right  12/01/2011  *RADIOLOGY REPORT*  Clinical Data: Tibial plateau fracture.  Status post fall  RIGHT KNEE - COMPLETE 4+ VIEW  Comparison: Plain films 11/29/2011.  Findings: Again seen is a medial tibial plateau fracture without change in position or alignment.  Joint effusion is decreased. No new abnormalities identified.  IMPRESSION: Medial tibial plateau fracture without change in position or alignment.  Decreased joint effusion.  Original Report Authenticated By: Bernadene Bell. D'ALESSIO, M.D.     1. Tibial plateau fracture       MDM  R leg pain, known tibial plateau fracture, not compliant with nonweightbearing, has not followed up with ortho. NV intact. Diffuse RLE swelling, with calf tenderness and intact pulses.  Will evaluate for DVT. Obtain Xrays of knee and ankle.  X-ray shows known tibial plateau fracture without change in position or alignment. Nasal x-rays negative. Doppler ultrasound of the right lower extremity is negative for DVT. Patient is given another knee immobilizer and once again told to follow up with orthopedics.     Glynn Octave, MD 12/01/11 [phone]

## 2011-12-01 NOTE — ED Notes (Signed)
rle propped with pillow, ice bag applied to knee.

## 2011-12-01 NOTE — ED Notes (Signed)
AVW:[id number]<XB> Expected date:12/01/11<BR> Expected time: 9:02 AM<BR> Means of arrival:Ambulance<BR> Comments:<BR> edema

## 2011-12-01 NOTE — ED Notes (Signed)
Per ems, "pt fell this past Saturday chasing after a pitbull.  He was seen here for it and was placed in a leg brace.  The pt has had complaints of trouble walking though he has not been wearing his leg brace, he has not followed up with ortho, he is falling, and also out of pain meds."

## 2011-12-03 ENCOUNTER — Encounter: Payer: Self-pay | Admitting: Family

## 2011-12-12 NOTE — H&P (Signed)
[name] Mcdonald is an 40 y.o. male.    Chief Complaint: Right knee tibial plateau fracture / pain  HPI: Pt is a 40 y.o. male complaining of right knee pain since 11/29/2011.  His dog got out of the house and and as he was chasing him he stepped in a hole causing him to twist his leg causing instant knee pain. He was brought to the ER where he was told he had a right tibial plateau fracture. Since the ER the pain has been continuous. X-rays from the ER show a right tibial plateau fracture of the medial aspect, unsure of lateral tibial involvement. Prior to having surgery the patient will have a CT of the right knee to determine the extent of the injury. Various options are discussed with the patient. Risks, benefits and expectations were discussed with the patient. Patient understand the risks, benefits and expectations and wishes to proceed with surgery.   PCP:  Lemont Fillers., NP, NP  D/C Plans: Home with HHPT vs rehab  Post-op Meds: No Rx given  Tranexamic Acid: Not to be given  PMH: Past Medical History  Diagnosis Date  . Depression   . GERD (gastroesophageal reflux disease)   . Migraine   . Ulcer     hx of stomach ulcer  . 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  [name] History of meningitis 2000    PSH: Past Surgical History  Procedure Date  . Brain surgery 2000    Removed growth?    Social History:  reports that he quit smoking about 6 months ago. He has never used smokeless tobacco. He reports that he does not drink alcohol or use illicit drugs.  Allergies:  Allergies  Allergen Reactions  . Phenobarbital Other (See Comments)    Bad dreams  . Phenytoin Itching  . Tegretol (Carbamazepine) Other (See Comments)    dizziness  . Topamax Other (See Comments)    Vertigo, lack of focus.    Medications: No current facility-administered medications on file as of .   Medications Prior to Admission  Medication Sig Dispense Refill  .  cephALEXin (KEFLEX) 500 MG capsule Take 1 capsule (500 mg total) by mouth 4 (four) times daily.  40 capsule  0  . clonazePAM (KLONOPIN) 2 MG tablet Take 2 mg by mouth 2 (two) times daily as needed. Panic attacks      . DULoxetine (CYMBALTA) 60 MG capsule Take 1 capsule (60 mg total) by mouth daily.  30 capsule  2  . levETIRAcetam (KEPPRA) 500 MG tablet Take 1 tablet (500 mg total) by mouth every 12 (twelve) hours.  60 tablet  2  . Multiple Vitamin (MULITIVITAMIN WITH MINERALS) TABS Take 1 tablet by mouth daily.        . Oxycodone HCl 10 MG TABS Take 10 mg by mouth every 8 (eight) hours as needed. pain       . oxyCODONE-acetaminophen (PERCOCET) 5-325 MG per tablet Take 2 tablets by mouth every 4 (four) hours as needed for pain.  6 tablet  0  . ranitidine (ZANTAC) 150 MG tablet Take 2 tablets (300 mg total) by mouth 2 (two) times daily.  120 tablet  2  . tiZANidine (ZANAFLEX) 4 MG tablet Take 4 mg by mouth every 8 (eight) hours as needed.           ROS: Review of Systems  Constitutional: Negative.   HENT: Negative.   Eyes: Negative.   Respiratory: Negative.  Cardiovascular: Negative.   Gastrointestinal: Negative.   Genitourinary: Negative.   Musculoskeletal: Positive for joint pain.  Skin: Negative.   Neurological: Negative.   Endo/Heme/Allergies: Negative.         Physican Exam: Physical Exam  Constitutional: He is oriented to person, place, and time and well-developed, well-nourished, and in no distress.  HENT:  Head: Normocephalic and atraumatic.  Nose: Nose normal.  Mouth/Throat: Oropharynx is clear and moist.  Eyes: Pupils are equal, round, and reactive to light.  Neck: Neck supple. No JVD present. No tracheal deviation present. No thyromegaly present.  Cardiovascular: Normal rate, regular rhythm and normal heart sounds.   Pulmonary/Chest: Effort normal and breath sounds normal. No stridor.  Abdominal: Soft. There is no tenderness. There is no guarding.    Musculoskeletal:       Right knee: He exhibits decreased range of motion, swelling and effusion. tenderness found.       Right ankle: He exhibits ecchymosis. He exhibits normal range of motion and no swelling. no tenderness.  Lymphadenopathy:    He has no cervical adenopathy.  Neurological: He is alert and oriented to person, place, and time.  Skin: Skin is warm and dry. No rash noted. No erythema. No pallor.      Assessment/Plan Assessment: Right knee tibial plateau fracture / pain   Plan: Patient will undergo a ORIF of a right tibial plateau fracture on 12/13/2011. Risks benefits and expectation were discussed with the patient. Patient understand risks, benefits and expectation and wishes to proceed.   Anastasio Auerbach Aine Strycharz   PAC  12/12/2011, 9:39 PM

## 2011-12-13 ENCOUNTER — Inpatient Hospital Stay (HOSPITAL_COMMUNITY): Payer: Medicare Other | Admitting: Anesthesiology

## 2011-12-13 ENCOUNTER — Encounter (HOSPITAL_COMMUNITY)
Admission: RE | Disposition: A | Discharge status: Home Health | Payer: Self-pay | Source: Ambulatory Visit | Attending: Orthopedic Surgery

## 2011-12-13 ENCOUNTER — Encounter (HOSPITAL_COMMUNITY): Payer: Self-pay | Admitting: Anesthesiology

## 2011-12-13 ENCOUNTER — Other Ambulatory Visit: Payer: Self-pay | Admitting: Orthopedic Surgery

## 2011-12-13 ENCOUNTER — Telehealth (HOSPITAL_COMMUNITY): Payer: Self-pay | Admitting: *Deleted

## 2011-12-13 ENCOUNTER — Other Ambulatory Visit: Payer: Self-pay | Admitting: *Deleted

## 2011-12-13 ENCOUNTER — Encounter (HOSPITAL_COMMUNITY): Payer: Self-pay | Admitting: *Deleted

## 2011-12-13 ENCOUNTER — Inpatient Hospital Stay (HOSPITAL_COMMUNITY)
Admission: RE | Admit: 2011-12-13 | Discharge: 2011-12-15 | DRG: 494 | Disposition: A | Discharge status: Home Health | Payer: Medicare Other | Source: Ambulatory Visit | Attending: Orthopedic Surgery | Admitting: Orthopedic Surgery

## 2011-12-13 ENCOUNTER — Inpatient Hospital Stay (HOSPITAL_COMMUNITY)
Admission: RE | Admit: 2011-12-13 | Discharge: 2011-12-13 | Disposition: A | Discharge status: Home / Self Care | Payer: Medicare Other | Source: Ambulatory Visit | Attending: Orthopedic Surgery | Admitting: Orthopedic Surgery

## 2011-12-13 ENCOUNTER — Telehealth: Payer: Self-pay | Admitting: Family

## 2011-12-13 ENCOUNTER — Inpatient Hospital Stay (HOSPITAL_COMMUNITY): Payer: Medicare Other

## 2011-12-13 DIAGNOSIS — S82143A Displaced bicondylar fracture of unspecified tibia, initial encounter for closed fracture: Secondary | ICD-10-CM

## 2011-12-13 DIAGNOSIS — W172XXA Fall into hole, initial encounter: Secondary | ICD-10-CM | POA: Diagnosis present

## 2011-12-13 DIAGNOSIS — K219 Gastro-esophageal reflux disease without esophagitis: Secondary | ICD-10-CM | POA: Diagnosis present

## 2011-12-13 DIAGNOSIS — F411 Generalized anxiety disorder: Secondary | ICD-10-CM | POA: Diagnosis present

## 2011-12-13 DIAGNOSIS — S82109A Unspecified fracture of upper end of unspecified tibia, initial encounter for closed fracture: Principal | ICD-10-CM | POA: Diagnosis present

## 2011-12-13 DIAGNOSIS — F329 Major depressive disorder, single episode, unspecified: Secondary | ICD-10-CM | POA: Diagnosis present

## 2011-12-13 DIAGNOSIS — F3289 Other specified depressive episodes: Secondary | ICD-10-CM | POA: Diagnosis present

## 2011-12-13 DIAGNOSIS — M25561 Pain in right knee: Secondary | ICD-10-CM

## 2011-12-13 DIAGNOSIS — Y92009 Unspecified place in unspecified non-institutional (private) residence as the place of occurrence of the external cause: Secondary | ICD-10-CM

## 2011-12-13 HISTORY — PX: ORIF TIBIA PLATEAU: SHX2132

## 2011-12-13 LAB — BASIC METABOLIC PANEL
CO2: 24 mEq/L (ref 19–32)
Calcium: 10.3 mg/dL (ref 8.4–10.5)
Creatinine, Ser: 0.74 mg/dL (ref 0.50–1.35)
GFR calc non Af Amer: >90 mL/min (ref >90)
Glucose, Bld: 98 mg/dL (ref 70–99)

## 2011-12-13 LAB — DIFFERENTIAL
Basophils Absolute: 0 10*3/uL (ref 0.0–0.1)
Lymphs Abs: 3.4 10*3/uL (ref 0.7–4.0)
Monocytes Absolute: 0.6 10*3/uL (ref 0.1–1.0)
Monocytes Relative: 6 % (ref 3–12)
Neutro Abs: 5.9 10*3/uL (ref 1.7–7.7)

## 2011-12-13 LAB — CBC
HCT: 43 % (ref 39.0–52.0)
MCH: 32.1 pg (ref 26.0–34.0)
MCV: 91.9 fL (ref 78.0–100.0)
RDW: 13.5 % (ref 11.5–15.5)
WBC: 10.2 10*3/uL (ref 4.0–10.5)

## 2011-12-13 LAB — URINALYSIS, ROUTINE W REFLEX MICROSCOPIC
Bilirubin Urine: NEGATIVE
Glucose, UA: NEGATIVE mg/dL
Specific Gravity, Urine: 1.027 (ref 1.005–1.030)

## 2011-12-13 LAB — TYPE AND SCREEN
ABO/RH(D): A POS
Antibody Screen: NEGATIVE

## 2011-12-13 LAB — PROTIME-INR: Prothrombin Time: 13.3 seconds (ref 11.6–15.2)

## 2011-12-13 LAB — SURGICAL PCR SCREEN
MRSA, PCR: NEGATIVE
Staphylococcus aureus: NEGATIVE

## 2011-12-13 SURGERY — OPEN REDUCTION INTERNAL FIXATION (ORIF) TIBIAL PLATEAU
Anesthesia: General | Site: Knee | Laterality: Right | Wound class: Clean

## 2011-12-13 MED ORDER — MIDAZOLAM HCL 5 MG/5ML IJ SOLN
INTRAMUSCULAR | Status: DC | PRN
  Administered 2011-12-13 (×2): 1 mg via INTRAVENOUS

## 2011-12-13 MED ORDER — DOCUSATE SODIUM 100 MG PO CAPS
100.0000 mg | ORAL_CAPSULE | Freq: Two times a day (BID) | ORAL | Status: DC
  Administered 2011-12-13 – 2011-12-15 (×4): 100 mg via ORAL
  Filled 2011-12-13 (×5): qty 1

## 2011-12-13 MED ORDER — ACETAMINOPHEN 650 MG RE SUPP: 650.0000 mg | Freq: Four times a day (QID) | RECTAL | Status: DC | PRN

## 2011-12-13 MED ORDER — PROMETHAZINE HCL 25 MG/ML IJ SOLN: 6.2500 mg | INTRAMUSCULAR | Status: DC | PRN

## 2011-12-13 MED ORDER — GLYCOPYRROLATE 0.2 MG/ML IJ SOLN
INTRAMUSCULAR | Status: DC | PRN
  Administered 2011-12-13: .4 mg via INTRAVENOUS

## 2011-12-13 MED ORDER — MENTHOL 3 MG MT LOZG: 1.0000 | LOZENGE | OROMUCOSAL | Status: DC | PRN

## 2011-12-13 MED ORDER — ONDANSETRON HCL 4 MG/2ML IJ SOLN: 4.0000 mg | Freq: Four times a day (QID) | INTRAMUSCULAR | Status: DC | PRN

## 2011-12-13 MED ORDER — ACETAMINOPHEN 10 MG/ML IV SOLN
INTRAVENOUS | Status: AC
  Filled 2011-12-13: qty 100

## 2011-12-13 MED ORDER — ONDANSETRON HCL 4 MG/2ML IJ SOLN
INTRAMUSCULAR | Status: DC | PRN
  Administered 2011-12-13: 4 mg via INTRAVENOUS

## 2011-12-13 MED ORDER — SODIUM CHLORIDE 0.9 % IV SOLN
INTRAVENOUS | Status: DC
  Administered 2011-12-13 – 2011-12-14 (×3): via INTRAVENOUS
  Filled 2011-12-13 (×9): qty 1000

## 2011-12-13 MED ORDER — HYDROMORPHONE HCL PF 1 MG/ML IJ SOLN
0.2500 mg | INTRAMUSCULAR | Status: DC | PRN
  Administered 2011-12-13 (×4): 0.5 mg via INTRAVENOUS

## 2011-12-13 MED ORDER — CHLORHEXIDINE GLUCONATE 4 % EX LIQD: 60.0000 mL | Freq: Once | CUTANEOUS | Status: DC

## 2011-12-13 MED ORDER — NEOSTIGMINE METHYLSULFATE 1 MG/ML IJ SOLN
INTRAMUSCULAR | Status: DC | PRN
  Administered 2011-12-13: 3 mg via INTRAVENOUS

## 2011-12-13 MED ORDER — BISACODYL 5 MG PO TBEC: 5.0000 mg | DELAYED_RELEASE_TABLET | Freq: Every day | ORAL | Status: DC | PRN

## 2011-12-13 MED ORDER — ROCURONIUM BROMIDE 100 MG/10ML IV SOLN
INTRAVENOUS | Status: DC | PRN
  Administered 2011-12-13: 35 mg via INTRAVENOUS

## 2011-12-13 MED ORDER — TIZANIDINE HCL 4 MG PO TABS
4.0000 mg | ORAL_TABLET | Freq: Three times a day (TID) | ORAL | Status: DC | PRN
  Administered 2011-12-13 – 2011-12-15 (×4): 4 mg via ORAL
  Filled 2011-12-13 (×5): qty 1

## 2011-12-13 MED ORDER — OXYCODONE-ACETAMINOPHEN 5-325 MG PO TABS
1.0000 | ORAL_TABLET | ORAL | Status: DC
  Administered 2011-12-13 – 2011-12-15 (×10): 2 via ORAL
  Filled 2011-12-13 (×10): qty 2

## 2011-12-13 MED ORDER — FENTANYL CITRATE 0.05 MG/ML IJ SOLN
INTRAMUSCULAR | Status: DC | PRN
  Administered 2011-12-13: 50 ug via INTRAVENOUS
  Administered 2011-12-13 (×2): 100 ug via INTRAVENOUS

## 2011-12-13 MED ORDER — KETOROLAC TROMETHAMINE 30 MG/ML IJ SOLN
INTRAMUSCULAR | Status: AC
  Filled 2011-12-13: qty 1

## 2011-12-13 MED ORDER — ACETAMINOPHEN 10 MG/ML IV SOLN
INTRAVENOUS | Status: DC | PRN
  Administered 2011-12-13: 1000 mg via INTRAVENOUS

## 2011-12-13 MED ORDER — CLONAZEPAM 1 MG PO TABS
2.0000 mg | ORAL_TABLET | Freq: Two times a day (BID) | ORAL | Status: DC | PRN
Start: 2011-12-13 — End: 2011-12-15
  Administered 2011-12-13 – 2011-12-15 (×4): 2 mg via ORAL
  Filled 2011-12-13 (×4): qty 2

## 2011-12-13 MED ORDER — ALUM & MAG HYDROXIDE-SIMETH 200-200-20 MG/5ML PO SUSP: 30.0000 mL | ORAL | Status: DC | PRN

## 2011-12-13 MED ORDER — MEPERIDINE HCL 25 MG/ML IJ SOLN: 6.2500 mg | INTRAMUSCULAR | Status: DC | PRN

## 2011-12-13 MED ORDER — PROPOFOL 10 MG/ML IV BOLUS
INTRAVENOUS | Status: DC | PRN
  Administered 2011-12-13: 160 mg via INTRAVENOUS

## 2011-12-13 MED ORDER — BUPIVACAINE-EPINEPHRINE 0.25% -1:200000 IJ SOLN
INTRAMUSCULAR | Status: AC
  Filled 2011-12-13: qty 1

## 2011-12-13 MED ORDER — FLEET ENEMA 7-19 GM/118ML RE ENEM: 1.0000 | ENEMA | Freq: Once | RECTAL | Status: AC | PRN

## 2011-12-13 MED ORDER — DIPHENHYDRAMINE HCL 25 MG PO CAPS: 25.0000 mg | ORAL_CAPSULE | Freq: Four times a day (QID) | ORAL | Status: DC | PRN

## 2011-12-13 MED ORDER — LIDOCAINE HCL (CARDIAC) 20 MG/ML IV SOLN
INTRAVENOUS | Status: DC | PRN
  Administered 2011-12-13: 100 mg via INTRAVENOUS

## 2011-12-13 MED ORDER — CEFAZOLIN SODIUM 1-5 GM-% IV SOLN
INTRAVENOUS | Status: AC
  Filled 2011-12-13: qty 50

## 2011-12-13 MED ORDER — HYDROMORPHONE HCL PF 1 MG/ML IJ SOLN
INTRAMUSCULAR | Status: DC | PRN
  Administered 2011-12-13 (×4): 0.5 mg via INTRAVENOUS

## 2011-12-13 MED ORDER — CEFAZOLIN SODIUM 1-5 GM-% IV SOLN
1.0000 g | INTRAVENOUS | Status: AC
  Administered 2011-12-13: 1 g via INTRAVENOUS

## 2011-12-13 MED ORDER — HYDROMORPHONE HCL PF 1 MG/ML IJ SOLN
INTRAMUSCULAR | Status: AC
  Administered 2011-12-13: 2 mg via INTRAVENOUS
  Filled 2011-12-13: qty 1

## 2011-12-13 MED ORDER — FERROUS SULFATE 325 (65 FE) MG PO TABS
325.0000 mg | ORAL_TABLET | Freq: Three times a day (TID) | ORAL | Status: DC
  Administered 2011-12-14 – 2011-12-15 (×4): 325 mg via ORAL
  Filled 2011-12-13 (×7): qty 1

## 2011-12-13 MED ORDER — ONDANSETRON HCL 4 MG PO TABS: 4.0000 mg | ORAL_TABLET | Freq: Four times a day (QID) | ORAL | Status: DC | PRN

## 2011-12-13 MED ORDER — ZOLPIDEM TARTRATE 5 MG PO TABS
5.0000 mg | ORAL_TABLET | Freq: Every evening | ORAL | Status: DC | PRN
  Administered 2011-12-13: 5 mg via ORAL
  Filled 2011-12-13: qty 1

## 2011-12-13 MED ORDER — HYDROMORPHONE HCL PF 1 MG/ML IJ SOLN
0.5000 mg | INTRAMUSCULAR | Status: DC | PRN
  Administered 2011-12-13 – 2011-12-15 (×17): 2 mg via INTRAVENOUS
  Filled 2011-12-13 (×17): qty 2

## 2011-12-13 MED ORDER — PHENOL 1.4 % MT LIQD: 1.0000 | OROMUCOSAL | Status: DC | PRN

## 2011-12-13 MED ORDER — ACETAMINOPHEN 325 MG PO TABS: 650.0000 mg | ORAL_TABLET | Freq: Four times a day (QID) | ORAL | Status: DC | PRN

## 2011-12-13 MED ORDER — CEFAZOLIN SODIUM 1-5 GM-% IV SOLN
1.0000 g | Freq: Four times a day (QID) | INTRAVENOUS | Status: AC
  Administered 2011-12-13 – 2011-12-14 (×3): 1 g via INTRAVENOUS
  Filled 2011-12-13 (×3): qty 50

## 2011-12-13 MED ORDER — POLYETHYLENE GLYCOL 3350 17 G PO PACK
17.0000 g | PACK | Freq: Every day | ORAL | Status: DC | PRN
  Filled 2011-12-13: qty 1

## 2011-12-13 MED ORDER — METOCLOPRAMIDE HCL 10 MG PO TABS: 5.0000 mg | ORAL_TABLET | Freq: Three times a day (TID) | ORAL | Status: DC | PRN

## 2011-12-13 MED ORDER — LEVETIRACETAM 500 MG PO TABS
500.0000 mg | ORAL_TABLET | Freq: Two times a day (BID) | ORAL | Status: DC
  Administered 2011-12-13 – 2011-12-15 (×4): 500 mg via ORAL
  Filled 2011-12-13 (×5): qty 1

## 2011-12-13 MED ORDER — ENOXAPARIN SODIUM 40 MG/0.4ML ~~LOC~~ SOLN
40.0000 mg | SUBCUTANEOUS | Status: DC
  Administered 2011-12-14 – 2011-12-15 (×2): 40 mg via SUBCUTANEOUS
  Filled 2011-12-13 (×2): qty 0.4

## 2011-12-13 MED ORDER — METOCLOPRAMIDE HCL 5 MG/ML IJ SOLN: 5.0000 mg | Freq: Three times a day (TID) | INTRAMUSCULAR | Status: DC | PRN

## 2011-12-13 MED ORDER — FAMOTIDINE 40 MG PO TABS
40.0000 mg | ORAL_TABLET | Freq: Every day | ORAL | Status: DC
  Administered 2011-12-13 – 2011-12-14 (×2): 40 mg via ORAL
  Filled 2011-12-13 (×3): qty 1

## 2011-12-13 MED ORDER — LACTATED RINGERS IV SOLN
INTRAVENOUS | Status: DC
  Administered 2011-12-13: 15:00:00 via INTRAVENOUS
  Administered 2011-12-13: 1000 mL via INTRAVENOUS

## 2011-12-13 MED ORDER — LACTATED RINGERS IV SOLN
INTRAVENOUS | Status: DC
  Administered 2011-12-13: 18:00:00 via INTRAVENOUS

## 2011-12-13 MED ORDER — DULOXETINE HCL 60 MG PO CPEP
60.0000 mg | ORAL_CAPSULE | Freq: Every day | ORAL | Status: DC
  Administered 2011-12-14 – 2011-12-15 (×2): 60 mg via ORAL
  Filled 2011-12-13 (×3): qty 1

## 2011-12-13 SURGICAL SUPPLY — 73 items
BAG SPEC THK2 15X12 ZIP CLS (MISCELLANEOUS) ×1
BAG ZIPLOCK 12X15 (MISCELLANEOUS) ×2 IMPLANT
BANDAGE ELASTIC 6 VELCRO ST LF (GAUZE/BANDAGES/DRESSINGS) ×1 IMPLANT
BIT DRILL 100X2.5XANTM LCK (BIT) IMPLANT
BIT DRILL CAL (BIT) IMPLANT
BIT DRL 100X2.5XANTM LCK (BIT) ×1
BNDG COHESIVE 4X5 TAN STRL (GAUZE/BANDAGES/DRESSINGS) ×2 IMPLANT
BNDG COHESIVE 6X5 TAN STRL LF (GAUZE/BANDAGES/DRESSINGS) ×2 IMPLANT
CLOTH BEACON ORANGE TIMEOUT ST (SAFETY) ×2 IMPLANT
COVER MAYO STAND STRL (DRAPES) ×1 IMPLANT
CUFF TOURN SGL QUICK 34 (TOURNIQUET CUFF) ×2
CUFF TRNQT CYL 34X4X40X1 (TOURNIQUET CUFF) ×1 IMPLANT
DRAPE C-ARM 42X72 X-RAY (DRAPES) ×2 IMPLANT
DRAPE INCISE IOBAN 66X45 STRL (DRAPES) ×2 IMPLANT
DRAPE LG THREE QUARTER DISP (DRAPES) ×2 IMPLANT
DRAPE U-SHAPE 47X51 STRL (DRAPES) ×2 IMPLANT
DRILL BIT 2.5MM (BIT) ×2
DRILL BIT CAL (BIT) ×2
DRSG AQUACEL AG ADV 3.5X 6 (GAUZE/BANDAGES/DRESSINGS) ×1 IMPLANT
DRSG AQUACEL AG ADV 3.5X10 (GAUZE/BANDAGES/DRESSINGS) ×1 IMPLANT
DRSG EMULSION OIL 3X16 NADH (GAUZE/BANDAGES/DRESSINGS) ×2 IMPLANT
DURAPREP 26ML APPLICATOR (WOUND CARE) ×2 IMPLANT
ELECT REM PT RETURN 9FT ADLT (ELECTROSURGICAL) ×2
ELECTRODE REM PT RTRN 9FT ADLT (ELECTROSURGICAL) ×1 IMPLANT
GLOVE BIOGEL PI IND STRL 7.5 (GLOVE) ×1 IMPLANT
GLOVE BIOGEL PI IND STRL 8 (GLOVE) ×1 IMPLANT
GLOVE BIOGEL PI INDICATOR 7.5 (GLOVE) ×1
GLOVE BIOGEL PI INDICATOR 8 (GLOVE) ×1
GLOVE ECLIPSE 8.0 STRL XLNG CF (GLOVE) IMPLANT
GLOVE ORTHO TXT STRL SZ7.5 (GLOVE) ×4 IMPLANT
GLOVE SURG ORTHO 8.0 STRL STRW (GLOVE) ×2 IMPLANT
GOWN STRL NON-REIN LRG LVL3 (GOWN DISPOSABLE) ×2 IMPLANT
IMMOBILIZER KNEE 20 (SOFTGOODS) ×2
IMMOBILIZER KNEE 20 THIGH 36 (SOFTGOODS) ×1 IMPLANT
K-WIRE ACE 1.6X6 (WIRE) ×8
KIT BASIN OR (CUSTOM PROCEDURE TRAY) ×2 IMPLANT
KWIRE ACE 1.6X6 (WIRE) IMPLANT
MANIFOLD NEPTUNE II (INSTRUMENTS) ×2 IMPLANT
NS IRRIG 1000ML POUR BTL (IV SOLUTION) ×2 IMPLANT
PACK LOWER EXTREMITY WL (CUSTOM PROCEDURE TRAY) ×2 IMPLANT
PAD CAST 4YDX4 CTTN HI CHSV (CAST SUPPLIES) ×2 IMPLANT
PADDING CAST COTTON 4X4 STRL (CAST SUPPLIES) ×4
PADDING WEBRIL 6 STERILE (GAUZE/BANDAGES/DRESSINGS) ×1 IMPLANT
PLATE ACE 3.5MM 4HOLE (Plate) ×1 IMPLANT
PLATE LOCK 3H STD RT PROX TIB (Plate) ×1 IMPLANT
POSITIONER SURGICAL ARM (MISCELLANEOUS) ×2 IMPLANT
SCREW CORT T15 TPR 55X3.5XST (Screw) IMPLANT
SCREW CORTICAL 3.5MM  34MM (Screw) ×1 IMPLANT
SCREW CORTICAL 3.5MM  46MM (Screw) ×1 IMPLANT
SCREW CORTICAL 3.5MM  55MM (Screw) IMPLANT
SCREW CORTICAL 3.5MM 34MM (Screw) IMPLANT
SCREW CORTICAL 3.5MM 38MM (Screw) ×1 IMPLANT
SCREW CORTICAL 3.5MM 46MM (Screw) IMPLANT
SCREW CORTICAL 3.5MM 55MM (Screw) IMPLANT
SCREW CORTICAL 3.5X55MM (Screw) ×2 IMPLANT
SCREW LOCK 3.5X60 DIST TIB (Screw) ×2 IMPLANT
SCREW LOCK 3.5X70 DIST TIB (Screw) ×1 IMPLANT
SCREW LOCK CORT STAR 3.5X65 (Screw) ×2 IMPLANT
SCREW LP 3.5X38MM (Screw) ×1 IMPLANT
SCREW LP 3.5X75MM (Screw) IMPLANT
SET PAD KNEE POSITIONER (MISCELLANEOUS) ×2 IMPLANT
SPONGE GAUZE 4X4 12PLY (GAUZE/BANDAGES/DRESSINGS) ×4 IMPLANT
SPONGE LAP 18X18 X RAY DECT (DISPOSABLE) ×4 IMPLANT
STOCKINETTE 8 INCH (MISCELLANEOUS) ×2 IMPLANT
STRIP CLOSURE SKIN 1/2X4 (GAUZE/BANDAGES/DRESSINGS) ×2 IMPLANT
SUCTION FRAZIER TIP 10 FR DISP (SUCTIONS) ×2 IMPLANT
SUT ETHILON 4 0 PS 2 18 (SUTURE) ×2 IMPLANT
SUT MNCRL AB 4-0 PS2 18 (SUTURE) ×2 IMPLANT
SUT VIC AB 1 CT1 27 (SUTURE) ×10
SUT VIC AB 1 CT1 27XBRD ANTBC (SUTURE) ×5 IMPLANT
SUT VIC AB 2-0 CT2 27 (SUTURE) ×2 IMPLANT
TOWEL OR 17X26 10 PK STRL BLUE (TOWEL DISPOSABLE) ×4 IMPLANT
WATER STERILE IRR 1500ML POUR (IV SOLUTION) ×2 IMPLANT

## 2011-12-13 NOTE — Transfer of Care (Signed)
Immediate Anesthesia Transfer of Care Note  Patient: [name] Mcdonald  Procedure(s) Performed:  OPEN REDUCTION INTERNAL FIXATION (ORIF) TIBIAL PLATEAU  Patient Location: PACU  Anesthesia Type: General  Level of Consciousness: awake, alert  and oriented  Airway & Oxygen Therapy: Patient Spontanous Breathing and Patient connected to face mask oxygen  Post-op Assessment: Report given to PACU RN and Post -op Vital signs reviewed and stable  Post vital signs: Reviewed and stable  Complications: No apparent anesthesia complications

## 2011-12-13 NOTE — Brief Op Note (Signed)
12/13/2011  5:10 PM  PATIENT:  [name] Mcdonald  40 y.o. male  PRE-OPERATIVE DIAGNOSIS:  Right Tibial Plateau Fracture  POST-OPERATIVE DIAGNOSIS:  Right Tibial Plateau Fracture  PROCEDURE:  Procedure(s): OPEN REDUCTION INTERNAL FIXATION (ORIF) TIBIAL PLATEAU  SURGEON:  Surgeon(s): Shelda Pal  PHYSICIAN ASSISTANT: Lanney Gins, PA-C  ANESTHESIA:   general  EBL:  Total I/O In: 1000 [I.V.:1000] Out: 200 [Urine:200]  BLOOD ADMINISTERED:none  DRAINS: none   LOCAL MEDICATIONS USED:  NONE  SPECIMEN:  No Specimen  DISPOSITION OF SPECIMEN:  N/A  COUNTS:  YES  TOURNIQUET:   Total Tourniquet Time Documented: Thigh (Right) - 96 minutes  DICTATION: .Other Dictation: Dictation Number [id number]  PLAN OF CARE: Admit to inpatient   PATIENT DISPOSITION:  PACU - hemodynamically stable.   Delay start of Pharmacological VTE agent (>24hrs) due to surgical blood loss or risk of bleeding:  {YES/NO/NOT APPLICABLE:20182

## 2011-12-13 NOTE — Anesthesia Preprocedure Evaluation (Addendum)
Anesthesia Evaluation  Patient identified by MRN, date of birth, ID band Patient awake    Reviewed: Allergy & Precautions, H&P , NPO status , Patient's Chart, lab work & pertinent test results  Airway Mallampati: II TM Distance: >3 FB Neck ROM: Full    Dental No notable dental hx. (+) Poor Dentition   Pulmonary neg pulmonary ROS,  clear to auscultation  Pulmonary exam normal       Cardiovascular neg cardio ROS Regular Normal    Neuro/Psych  Headaches, Seizures -, Poorly Controlled,  PSYCHIATRIC DISORDERS Anxiety Depression  Neuromuscular disease Negative Neurological ROS  Negative Psych ROS   GI/Hepatic negative GI ROS, Neg liver ROS, GERD-  ,  Endo/Other  Negative Endocrine ROSHyperthyroidism   Renal/GU negative Renal ROS  Genitourinary negative   Musculoskeletal negative musculoskeletal ROS (+)   Abdominal   Peds negative pediatric ROS (+)  Hematology negative hematology ROS (+)   Anesthesia Other Findings   Reproductive/Obstetrics negative OB ROS                          Anesthesia Physical Anesthesia Plan  ASA: II  Anesthesia Plan: General   Post-op Pain Management:    Induction: Intravenous  Airway Management Planned: Oral ETT  Additional Equipment:   Intra-op Plan:   Post-operative Plan:   Informed Consent: I have reviewed the patients History and Physical, chart, labs and discussed the procedure including the risks, benefits and alternatives for the proposed anesthesia with the patient or authorized representative who has indicated his/her understanding and acceptance.   Dental advisory given  Plan Discussed with: CRNA  Anesthesia Plan Comments:        Anesthesia Quick Evaluation

## 2011-12-13 NOTE — Anesthesia Postprocedure Evaluation (Signed)
  Anesthesia Post-op Note  Patient: [name] Mcdonald  Procedure(s) Performed:  OPEN REDUCTION INTERNAL FIXATION (ORIF) TIBIAL PLATEAU  Patient Location: PACU  Anesthesia Type: General  Level of Consciousness: awake and alert   Airway and Oxygen Therapy: Patient Spontanous Breathing  Post-op Pain: mild  Post-op Assessment: Post-op Vital signs reviewed, Patient's Cardiovascular Status Stable, Respiratory Function Stable, Patent Airway and No signs of Nausea or vomiting  Post-op Vital Signs: stable  Complications: No apparent anesthesia complications

## 2011-12-13 NOTE — Interval H&P Note (Signed)
History and Physical Interval Note:  12/13/2011 7:12 AM  [name] Mcdonald  has presented today for surgery, with the diagnosis of Right Tibial Plateau Fracture  The various methods of treatment have been discussed with the patient and family. After consideration of risks, benefits and other options for treatment, the patient has consented to  Procedure(s): OPEN REDUCTION INTERNAL FIXATION (ORIF) RIGHT TIBIAL PLATEAU as a surgical intervention .  The patients' history has been reviewed, patient examined, no change in status, stable for surgery.  I have reviewed the patients' chart and labs.  Questions were answered to the patient's satisfaction.     Shelda Pal

## 2011-12-13 NOTE — Telephone Encounter (Signed)
Dismissal Letter sent by Certified Mail 12/13/2011  Dismissal Letter returned Unclaimed 01/06/2012  Dismissal Letter sent by 1st Class Mail 01/07/2012

## 2011-12-14 LAB — BASIC METABOLIC PANEL
BUN: 13 mg/dL (ref 6–23)
CO2: 26 mEq/L (ref 19–32)
Chloride: 98 mEq/L (ref 96–112)
GFR calc Af Amer: >90 mL/min (ref >90)
Glucose, Bld: 101 mg/dL — ABNORMAL HIGH (ref 70–99)
Potassium: 3.8 mEq/L (ref 3.5–5.1)

## 2011-12-14 LAB — CBC
HCT: 36.3 % — ABNORMAL LOW (ref 39.0–52.0)
Hemoglobin: 12.8 g/dL — ABNORMAL LOW (ref 13.0–17.0)
RBC: 3.89 MIL/uL — ABNORMAL LOW (ref 4.22–5.81)
RDW: 13.4 % (ref 11.5–15.5)
WBC: 14.2 10*3/uL — ABNORMAL HIGH (ref 4.0–10.5)

## 2011-12-14 NOTE — Progress Notes (Signed)
Patient ID: [name] Mcdonald, male   DOB: 1971-04-29, 41 y.o.   MRN: [id number] Subjective: 1 Day Post-Op Procedure(s) (LRB): OPEN REDUCTION INTERNAL FIXATION (ORIF) TIBIAL PLATEAU (Right)    Patient reports pain as moderate. Has low back pain most likely from preexisting issues, and from hospital bed. Reports swelling right leg   Objective:   VITALS:   Filed Vitals:   12/14/11 0145  BP: 136/75  Pulse: 89  Temp: 98.3 F (36.8 C)  Resp: 16    Neurovascular intact Incision: dressing C/D/I Compartment soft But does have swelling into the right foot and ankle. Only tolerates a little movement of foot and ankle  LABS  Basename 12/14/11 0425 12/13/11 1050  HGB 12.8* 15.0  HCT 36.3* 43.0  WBC 14.2* 10.2  PLT 376 419*     Basename 12/14/11 0425 12/13/11 1050  NA 132* 138  K 3.8 3.9  BUN 13 20  CREATININE 0.64 0.74  GLUCOSE 101* 98     Basename 12/13/11 1050  LABPT --  INR 0.99     Assessment/Plan: 1 Day Post-Op Procedure(s) (LRB): OPEN REDUCTION INTERNAL FIXATION (ORIF) TIBIAL PLATEAU (Right)   Advance diet Up with therapy Discharge to SNF, no preop plans NWB RLE Heating pad for low back pain prn

## 2011-12-14 NOTE — Progress Notes (Signed)
Physical Therapy Evaluation Patient Details Name: [name] Mcdonald MRN: [id number] DOB: 1971/03/01 Today's Date: 12/14/2011 1105 - 1133; EVAL Problem List:  Patient Active Problem List  Diagnoses  . METHICILLIN RESISTANT STAPHYLOCOCCUS AUREUS INFECTION  . ANXIETY  . SMOKER  . ATTENTION DEFICIT DISORDER  . CELLULITIS/ABSCESS, LEG  . FIBROMYALGIA  . SEIZURE DISORDER  . HX, PERSONAL, NERVE SYST/SENSE ORG DISORD NEC  . Hyperthyroidism  . Tibial plateau fracture  . Fracture, right tibial plateau    Past Medical History:  Past Medical History  Diagnosis Date  . Depression   . GERD (gastroesophageal reflux disease)   . Migraine   . Ulcer     hx of stomach ulcer  . 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  [name] History of meningitis 2000   Past Surgical History:  Past Surgical History  Procedure Date  . Brain surgery 2000    Removed growth?    PT Assessment/Plan/Recommendation PT Assessment Clinical Impression Statement: Pt with R tibial plateau fx presents with decreased functional mobiltiy and decreased R LE strength/ROM.  PT will benefit from skilled PT intervention to maximize IND for d/c. PT Recommendation/Assessment: Patient will need skilled PT in the acute care venue PT Problem List: Decreased strength;Decreased range of motion;Decreased activity tolerance;Decreased mobility;Decreased knowledge of use of DME;Decreased knowledge of precautions;Pain Barriers to Discharge: Decreased caregiver support (? level of assist mother can provide) Barriers to Discharge Comments: Pt states he and mother "take care of each other" PT Therapy Diagnosis : Difficulty walking PT Plan PT Frequency: Min 5X/week PT Treatment/Interventions: DME instruction;Gait training;Stair training;Functional mobility training;Therapeutic exercise;Patient/family education PT Recommendation Recommendations for Other Services: OT consult Follow Up Recommendations: Home health  PT;Skilled nursing facility (dependent on acute progress and level of home assist availab) Equipment Recommended: Rolling walker with 5" wheels PT Goals  Acute Rehab PT Goals PT Goal Formulation: With patient Time For Goal Achievement: 7 days Pt will go Supine/Side to Sit: with supervision PT Goal: Supine/Side to Sit - Progress: Progressing toward goal Pt will go Sit to Supine/Side: with supervision PT Goal: Sit to Supine/Side - Progress: Not met Pt will go Sit to Stand: with supervision PT Goal: Sit to Stand - Progress: Progressing toward goal Pt will go Stand to Sit: with supervision PT Goal: Stand to Sit - Progress: Progressing toward goal Pt will Ambulate: 16 - 50 feet;with supervision;with rolling walker PT Goal: Ambulate - Progress: Progressing toward goal Pt will Go Up / Down Stairs: 1-2 stairs;with min assist;with least restrictive assistive device PT Goal: Up/Down Stairs - Progress: Not met  PT Evaluation Precautions/Restrictions  Precautions Precautions: Fall Required Braces or Orthoses: No Restrictions Weight Bearing Restrictions: Yes RLE Weight Bearing: Non weight bearing Prior Functioning  Home Living Lives With: Family (elderly mother) Receives Help From: Family Type of Home: House Home Layout: One level Home Access: Stairs to enter Entrance Stairs-Rails: None Entrance Stairs-Number of Steps: 2 Prior Function Level of Independence: Independent with basic ADLs;Independent with gait;Independent with transfers Able to Take Stairs?: Yes Cognition Cognition Arousal/Alertness: Awake/alert Overall Cognitive Status: Appears within functional limits for tasks assessed Orientation Level: Oriented X4 Sensation/Coordination Coordination Gross Motor Movements are Fluid and Coordinated: Yes Extremity Assessment RUE Assessment RUE Assessment: Within Functional Limits LUE Assessment LUE Assessment: Within Functional Limits RLE Assessment RLE Assessment: Exceptions to  [facility] (pt refuses to attempt ROM secondary to c/o pain) LLE Assessment LLE Assessment: Within Functional Limits Mobility (including Balance) Bed Mobility Bed Mobility: Yes Supine to  Sit: 4: Min assist;3: Mod assist;With rails Supine to Sit Details (indicate cue type and reason): assist to move R LE - pt states hurts to much to move Transfers Transfers: Yes Sit to Stand: 4: Min assist;From bed;With upper extremity assist Sit to Stand Details (indicate cue type and reason): cues for use of UEs and for LE position - pt insists on pulling self up on RW Stand to Sit: 4: Min assist;3: Mod assist;With armrests;With upper extremity assist;To chair/3-in-1 Stand to Sit Details: cues for use of UEs and for LE position - pt insists on pulling self up on RW Ambulation/Gait Ambulation/Gait: Yes Ambulation/Gait Assistance: 4: Min assist;3: Mod assist Ambulation/Gait Assistance Details (indicate cue type and reason): pt willing to move bed to chair only - refuses to attempt ambulation in halls Ambulation Distance (Feet): 4 Feet Assistive device: Rolling walker Gait Pattern: Step-to pattern    Exercise    End of Session PT - End of Session Equipment Utilized During Treatment: Gait belt Activity Tolerance: Patient limited by pain Patient left: in chair;with call bell in reach Nurse Communication: Mobility status for transfers;Mobility status for ambulation General Behavior During Session: [facility] for tasks performed Cognition: [facility] for tasks performed  Elai Vanwyk 12/14/2011, 1:08 PM

## 2011-12-14 NOTE — Op Note (Signed)
NAMESTRIDER, VALLANCE NO.:  [id number]  MEDICAL RECORD NO.:  [id number]  LOCATION:  1613                         FACILITY:  [facility]  PHYSICIAN:  Madlyn Frankel. Charlann Boxer, M.D.  DATE OF BIRTH:  06-Sep-1971  DATE OF PROCEDURE:  12/13/2011 DATE OF DISCHARGE:                              OPERATIVE REPORT   PREOPERATIVE DIAGNOSIS:  Right tibial plateau fracture predominantly involving the proximal medial tibial plateau, extending to the lateral of the tibial spine.  POSTOPERATIVE DIAGNOSIS:  Right tibial plateau fracture predominantly involving the proximal medial tibial plateau, extending to the lateral of the tibial spine.  PROCEDURE:  Open reduction and internal fixation of right tibial plateau fracture.  Utilizing a Biomet proximal tibial plate.  SURGEON:  Madlyn Frankel. Charlann Boxer, MD  ASSISTANT:  Lanney Gins, PA  Please note that Lanney Gins was present for the entirety of the case from preoperative position, perioperative retractor management, assistance with reduction of the fracture, and general facilitation of the case as well as primary wound closure.  ANESTHESIA:  General.  SPECIMENS:  None.  COMPLICATION:  None.  TOURNIQUET TIME:  95 minutes at 200 mmHg.  INDICATIONS FOR PROCEDURE:  [name] Mcdonald is a 41 year old male patient of mine.  He was chasing his dog when he stepped into a pothole, had immediate onset of pain.  Radiographic evaluation including CT scan revealed a predominant medial tibial plateau fracture, split variant with extension just lateral to the tibial spines.  He was seen and evaluated in the office and subsequently set up for surgery.  I reviewed the indications for the procedure, risks of infection, DVT, in addition to nonunion based on smoking history as well as potential irritation; however, all reviewed.  Consent was obtained for benefit of fracture management.  PROCEDURE IN DETAIL:  The patient was brought to the operative  theater. Once adequate anesthesia, preoperative antibiotics, Ancef administered, the patient was positioned supine on the operative table.  The right thigh tourniquet was placed.  The right lower extremity was prepped and draped in sterile fashion.  A time-out was performed identifying the patient, planned procedure, and extremity.  The leg was exsanguinated, tourniquet elevated to 200 mmHg.  I chose to utilize a 2- incision technique for this based on the medial fracture split pattern.  I was going to use this buttress-type plate over this medial side, but was going to do predominant fixation through the lateral side.  Initially, the medial incision, which was carried out about 2 cm medial to the tibial tubercle was carried out about 5-6 cm in length.  Sharp dissection was carried down to the overlying fascia, which was then split and elevated off the fracture site.  I attended now to the lateral incision, which was a lateral based incision typical for lateral tibial plateaus.  Sharp dissection was carried to the underlying retinacular and fascial tissues.  Then carried out an L type fascial incision from the lateral tibial crest to the joint line and then extending posteriorly.  The soft tissue was elevated subperiosteal off the bone.  Once the proximal tibia was exposed in this fashion, I chose a 3-hole standard Biomet tibial plateau plate, this fit  nearly anatomic to his proximal lateral tibia.  Under fluoroscopic imaging, we then placed the plate along the proximal lateral tibia, then provisionally fixed it with some K-wires.  I then used a large King-Kong clamp holding the medial fracture reduced into an anatomic position and maintained this reduction.  Once this was confirmed radiographically, I placed 1 screw, a nonlocking proximal screw, which ended up being removed, it was a 75 mm screw, but it got bicortical fixation for the proximal segment, and then a distal bicortical  screw.  At this point, the lateral plate was directly on the bone and the fracture reduction maintained.  At this point, I placed screws into the 3-hole plate to provide sufficient fixation of the plate as well as provides support to the medial tibial plateau.  The King-Kong clamp was removed.  Following this, attention was now directed to the medial buttress.  I used a 1/3rd tubular 4-hole plate, contoured it to match the proximal medial flare. I then placed 2 bicortical screws distally and a single cortical screw proximally to stabilize the proximal segment.  Followup radiographs revealed an anatomically aligned tibial plateau at this point without evidence of complications.  The wounds were irrigated with normal saline solution.  Both medial and lateral fascia layers were reapproximated using #1 Vicryl.  The remaining wound was closed with 2-0 Vicryl and staples on the skin.  The skin was cleaned, dried, dressed sterilely using Aquacel dressing and a sterile bulky wrap.  He was then extubated and brought to recovery room in stable condition, tolerating the procedure well.  He will be admitted to the hospital with physical therapy, nonweightbearing for 6 weeks.  Range of motion will be permitted and urged particularly in followup visit apparently with utilization of physical therapy.  He will be seen initially in 2 weeks in the office.     Madlyn Frankel Charlann Boxer, M.D.     MDO/MEDQ  D:  12/13/2011  T:  12/14/2011  Job:  [id number]

## 2011-12-14 NOTE — Progress Notes (Signed)
Physical Therapy Treatment Patient Details Name: [name] Mcdonald MRN: [id number] DOB: 03-08-1971 Today's Date: 12/14/2011 1434 - 1448; TA PT Assessment/Plan  PT - Assessment/Plan PT Plan: Discharge plan remains appropriate PT Frequency: Min 5X/week Follow Up Recommendations: Home health PT;Skilled nursing facility Equipment Recommended: Rolling walker with 5" wheels PT Goals  Acute Rehab PT Goals Time For Goal Achievement: 7 days Pt will go Supine/Side to Sit: with supervision PT Goal: Supine/Side to Sit - Progress: Progressing toward goal Pt will go Sit to Supine/Side: with supervision PT Goal: Sit to Supine/Side - Progress: Progressing toward goal Pt will go Sit to Stand: with supervision PT Goal: Sit to Stand - Progress: Progressing toward goal Pt will go Stand to Sit: with supervision PT Goal: Stand to Sit - Progress: Progressing toward goal Pt will Ambulate: 16 - 50 feet;with supervision;with rolling walker PT Goal: Ambulate - Progress: Not met Pt will Go Up / Down Stairs: 1-2 stairs;with min assist;with least restrictive assistive device PT Goal: Up/Down Stairs - Progress: Not met  PT Treatment Precautions/Restrictions  Precautions Precautions: Fall Required Braces or Orthoses: No Restrictions Weight Bearing Restrictions: Yes RLE Weight Bearing: Non weight bearing Mobility (including Balance) Bed Mobility Sit to Supine - Right: 5: Supervision;4: Min assist;With rail Transfers Sit to Stand: 4: Min assist;With upper extremity assist;With armrests Sit to Stand Details (indicate cue type and reason): cues for LE position and use of UEs Stand to Sit: 4: Min assist;5: Supervision;With upper extremity assist;To bed Stand to Sit Details: cues for LE position and use of UEs Ambulation/Gait Ambulation/Gait Assistance: 4: Min assist Ambulation/Gait Assistance Details (indicate cue type and reason): pt unwilling to attempt hallway ambulation secondary to continued c/o  pain Ambulation Distance (Feet): 5 Feet Assistive device: Rolling walker Gait Pattern: Step-to pattern    Exercise    End of Session PT - End of Session Activity Tolerance: Patient limited by pain Patient left: in bed;with call bell in reach Nurse Communication: Mobility status for transfers;Mobility status for ambulation General Behavior During Session: [name] Mcdonald for tasks performed Cognition: [facility] for tasks performed  [name] Mcdonald 12/14/2011, 4:17 PM

## 2011-12-15 ENCOUNTER — Encounter (HOSPITAL_COMMUNITY): Payer: Self-pay | Admitting: Orthopedic Surgery

## 2011-12-15 ENCOUNTER — Ambulatory Visit: Payer: Medicare Other | Admitting: Family

## 2011-12-15 DIAGNOSIS — Z0289 Encounter for other administrative examinations: Secondary | ICD-10-CM

## 2011-12-15 LAB — BASIC METABOLIC PANEL
CO2: 29 mEq/L (ref 19–32)
Calcium: 9.3 mg/dL (ref 8.4–10.5)
Glucose, Bld: 113 mg/dL — ABNORMAL HIGH (ref 70–99)
Sodium: 135 mEq/L (ref 135–145)

## 2011-12-15 LAB — CBC
Hemoglobin: 12.6 g/dL — ABNORMAL LOW (ref 13.0–17.0)
MCH: 31.9 pg (ref 26.0–34.0)
RBC: 3.95 MIL/uL — ABNORMAL LOW (ref 4.22–5.81)

## 2011-12-15 MED ORDER — ASPIRIN EC 325 MG PO TBEC: 325.0000 mg | DELAYED_RELEASE_TABLET | Freq: Two times a day (BID) | ORAL | Status: AC

## 2011-12-15 MED ORDER — ENOXAPARIN SODIUM 40 MG/0.4ML ~~LOC~~ SOLN: 40.0000 mg | SUBCUTANEOUS | Status: AC

## 2011-12-15 MED ORDER — OXYCODONE-ACETAMINOPHEN 5-325 MG PO TABS: 1.0000 | ORAL_TABLET | ORAL | Status: AC

## 2011-12-15 MED ORDER — DSS 100 MG PO CAPS: 100.0000 mg | ORAL_CAPSULE | Freq: Two times a day (BID) | ORAL | Status: DC

## 2011-12-15 MED ORDER — DIPHENHYDRAMINE HCL 25 MG PO CAPS: 25.0000 mg | ORAL_CAPSULE | Freq: Four times a day (QID) | ORAL | Status: DC | PRN

## 2011-12-15 MED ORDER — POLYETHYLENE GLYCOL 3350 17 G PO PACK: 17.0000 g | PACK | Freq: Two times a day (BID) | ORAL | Status: AC

## 2011-12-15 MED ORDER — FERROUS SULFATE 325 (65 FE) MG PO TABS: 325.0000 mg | ORAL_TABLET | Freq: Three times a day (TID) | ORAL | Status: DC

## 2011-12-15 NOTE — Progress Notes (Signed)
Physical Therapy Treatment Patient Details Name: [name] Mcdonald MRN: [id number] DOB: June 05, 1971 Today's Date: 12/15/2011 1478 - 2956 GT, TA PT Assessment/Plan  PT - Assessment/Plan Comments on Treatment Session: Pt continues impulsive but much more open to input on transfer and mobility to improve saftey PT Plan: Discharge plan needs to be updated PT Frequency: Min 5X/week Follow Up Recommendations: Home health PT Equipment Recommended: Rolling walker with 5" wheels PT Goals  Acute Rehab PT Goals Time For Goal Achievement: 7 days Pt will go Supine/Side to Sit: with supervision PT Goal: Supine/Side to Sit - Progress: Met Pt will go Sit to Supine/Side: with supervision PT Goal: Sit to Supine/Side - Progress: Met Pt will go Sit to Stand: with supervision PT Goal: Sit to Stand - Progress: Met Pt will go Stand to Sit: with supervision PT Goal: Stand to Sit - Progress: Met Pt will Ambulate: 16 - 50 feet;with supervision;with rolling walker PT Goal: Ambulate - Progress: Met Pt will Go Up / Down Stairs: 1-2 stairs;with min assist;with least restrictive assistive device PT Goal: Up/Down Stairs - Progress: Met  PT Treatment Precautions/Restrictions  Precautions Precautions: Fall Required Braces or Orthoses: No Restrictions Weight Bearing Restrictions: Yes RLE Weight Bearing: Non weight bearing Mobility (including Balance) Bed Mobility Supine to Sit: 5: Supervision Transfers Sit to Stand: 5: Supervision;With upper extremity assist;From bed Sit to Stand Details (indicate cue type and reason): cues for use of UEs and LE position Stand to Sit: 5: Supervision;To chair/3-in-1;With armrests Stand to Sit Details: cues for use of UEs and LE position Ambulation/Gait Ambulation/Gait Assistance: 4: Min assist;5: Supervision Ambulation/Gait Assistance Details (indicate cue type and reason): min assist to ambulate with crutches; supervision to ambulate with RW Ambulation Distance (Feet): 95  Feet (45' with crutches and 23' with RW) Assistive device: Rolling walker Gait Pattern: Step-to pattern Stairs: Yes Stairs Assistance: 4: Min assist Stairs Assistance Details (indicate cue type and reason): cues for sequence and foot/crutch placement Stair Management Technique: No rails;With crutches;Forwards;Step to pattern Number of Stairs: 4     Exercise    End of Session PT - End of Session Equipment Utilized During Treatment: Gait belt Activity Tolerance: Patient tolerated treatment well;Patient limited by fatigue;Patient limited by pain Patient left: in chair;with call bell in reach Nurse Communication: Mobility status for transfers;Mobility status for ambulation General Behavior During Session: [facility] for tasks performed Cognition: [facility] for tasks performed  [name] Mcdonald 12/15/2011, 1:29 PM

## 2011-12-15 NOTE — Progress Notes (Signed)
  Occupational Therapy Cancellation Note  Pt refused OT services x 2 this a.m. Pt scheduled to discharge home today [name]  12/15/2011, 11:13 AM

## 2011-12-15 NOTE — Discharge Summary (Signed)
Physician Discharge Summary  Patient ID: [name] Mcdonald MRN: [id number] DOB/AGE: Jan 12, 1971 41 y.o.  Admit date: 12/13/2011 Discharge date: 12/15/2011  Procedures:  Procedure(s) (LRB): OPEN REDUCTION INTERNAL FIXATION (ORIF) TIBIAL PLATEAU (Right)  Attending Physician: Shelda Pal   Admission Diagnoses: Right knee tibial plateau fracture / pain   Discharge Diagnoses:  Principal Problem:  *Fracture, right tibial plateau Depression   GERD  Migraine   Ulcer   Seizures   HPI: Pt is a 41 y.o. male complaining of right knee pain since 11/29/2011. His dog got out of the house and and as he was chasing him he stepped in a hole causing him to twist his leg causing instant knee pain. He was brought to the ER where he was told he had a right tibial plateau fracture. Since the ER the pain has been continuous. X-rays from the ER show a right tibial plateau fracture of the medial aspect, unsure of lateral tibial involvement. Prior to having surgery the patient will have a CT of the right knee to determine the extent of the injury. Various options are discussed with the patient. Risks, benefits and expectations were discussed with the patient. Patient understand the risks, benefits and expectations and wishes to proceed with surgery.  PCP: Lemont Fillers., NP, NP   Discharged Condition: good  Hospital Course:  Patient underwent the above stated procedure on 12/13/2011. Patient tolerated the procedure well and brought to the recovery room in good condition and subsequently to the floor.  POD #1 BP: 136/75 ; Pulse: 89 ; Temp: 98.3 F (36.8 C) ; Resp: 16  Pt's foley was removed, as well as the hemovac drain removed. IV was changed to a saline lock. Patient reports pain as moderate. Has low back pain most likely from preexisting issues, and from hospital bed. Reports swelling right knee. Neurovascular intact, icision: dressing C/D/I, copartment soft, bt does have swelling into the right foot  and ankle. Only tolerates a little movement of foot and ankle  LABS  Basename  12/14/11 0425    HGB  12.8*    HCT  36.3*     POD #2  BP: 109/74 ; Pulse: 103 ; Temp: 99.3 F (37.4 C) ; Resp: 16  Patient reports pain as moderate. Has low back pain most likely from preexisting issues, and from hospital bed. Reports swelling right knee. Worked well with PT. Neurovascular intact, icision: dressing C/D/I, copartment soft, bt does have swelling into the right foot and ankle. Only tolerates a little movement of foot and ankle. Feels ready to be discharged home with HHPT.  LABS  Basename  12/15/11 0424     HGB  12.6*     HCT  36.8*       Discharge Exam: Extremities: Homans sign is negative, no sign of DVT, no edema, redness or tenderness in the calves or thighs and no ulcers, gangrene or trophic changes  Disposition: Home or Self Care with follow up in 2 weeks at [facility]  Discharge Orders    Future Appointments: Provider: Department: Dept Phone: Center:   12/15/2011 10:30 AM Melissa S. Peggyann Juba, NP [place] [phone] [facility]   01/07/2012 9:30 AM Denton Meek, MD [facility] [phone] None     Future Orders Please Complete By Expires   Call MD / Call 911      Comments:   If you experience chest pain or shortness of breath, CALL 911 and be transported to the hospital emergency room.  If you develope a fever  above 101 F, pus (white drainage) or increased drainage or redness at the wound, or calf pain, call your surgeon's office.   Discharge instructions      Comments:   Maintain surgical dressing for 8 days, then replace with gauze and tape. Keep the area dry and clean until follow up. Follow up in 2 weeks at [facility]. Call with any questions or concerns.  As much as possible, elevate the right knee to help decrease swelling.     Constipation Prevention      Comments:   Drink plenty of fluids.  Prune juice may be helpful.  You may use a  stool softener, such as Colace (over the counter) 100 mg twice a day.  Use MiraLax (over the counter) for constipation as needed.   Increase activity slowly as tolerated      Weight Bearing as taught in Physical Therapy      Comments:   Use a walker or crutches as instructed.   Driving restrictions      Comments:   No driving for 4 weeks   TED hose      Comments:   Use stockings (TED hose) for 2 weeks on both leg(s).  You may remove them at night for sleeping.   Change dressing      Comments:   Maintain surgical dressing for 8 days, then change the dressing daily with sterile 4 x 4 inch gauze dressing and tape.Keep the area dry and clean.      Current Discharge Medication List    START taking these medications   Details  aspirin EC 325 MG tablet Take 1 tablet (325 mg total) by mouth 2 (two) times daily. X 4 weeks. Start after finishing the Lovenox. Refills: 0    diphenhydrAMINE (BENADRYL) 25 mg capsule Take 1 capsule (25 mg total) by mouth every 6 (six) hours as needed for itching, allergies or sleep.    docusate sodium 100 MG CAPS Take 100 mg by mouth 2 (two) times daily.    enoxaparin (LOVENOX) 40 MG/0.4ML SOLN Inject 0.4 mLs (40 mg total) into the skin daily. Qty: 14 Syringe, Refills: 0    ferrous sulfate 325 (65 FE) MG tablet Take 1 tablet (325 mg total) by mouth 3 (three) times daily after meals.    oxyCODONE-acetaminophen (PERCOCET) 5-325 MG per tablet Take 1-2 tablets by mouth every 4 (four) hours. Qty: 120 tablet, Refills: 0    polyethylene glycol (MIRALAX / GLYCOLAX) packet Take 17 g by mouth 2 (two) times daily.      CONTINUE these medications which have NOT CHANGED   Details  clonazePAM (KLONOPIN) 2 MG tablet Take 2 mg by mouth 2 (two) times daily as needed. Panic attacks    DULoxetine (CYMBALTA) 60 MG capsule Take 1 capsule (60 mg total) by mouth daily. Qty: 30 capsule, Refills: 2    levETIRAcetam (KEPPRA) 500 MG tablet Take 1 tablet (500 mg total) by mouth  every 12 (twelve) hours. Qty: 60 tablet, Refills: 2    Multiple Vitamin (MULITIVITAMIN WITH MINERALS) TABS Take 1 tablet by mouth daily.      ranitidine (ZANTAC) 150 MG tablet Take 2 tablets (300 mg total) by mouth 2 (two) times daily. Qty: 120 tablet, Refills: 2    tiZANidine (ZANAFLEX) 4 MG tablet Take 4 mg by mouth every 8 (eight) hours as needed.        STOP taking these medications     Oxycodone HCl 10 MG TABS Comments:  Reason for  Stopping:          Signed: Anastasio Auerbach. Bernarr Longsworth   PAC  12/15/2011, 9:57 AM

## 2011-12-15 NOTE — Progress Notes (Signed)
dx right tibial plateau fracture; ORIF 12/15/2011 Raynelle Bring BSN CCM [phone] CM consult; Spoke with patient who plans to go back to his home in [place] where his Mother and personal care assistant will be caregivers. Will need RW. [facility] can provide HHpt and RW

## 2011-12-15 NOTE — Progress Notes (Signed)
D/c instructions given and explained to pt.  Prescriptions given for lovenox and percocet, placed in pt's hands.  Pt stable and ready for d/c. Took pt down via wheelchair to car at this time.

## 2011-12-15 NOTE — Progress Notes (Addendum)
Subjective: 2 Days Post-Op Procedure(s) (LRB): OPEN REDUCTION INTERNAL FIXATION (ORIF) TIBIAL PLATEAU (Right)   Patient reports pain as moderate. No events. Worked well with PT. Feels ready to be discharged home.  Objective:   VITALS:   Filed Vitals:   12/15/11 0525  BP: 109/74  Pulse: 103  Temp: 99.3 F (37.4 C)  Resp: 16    Neurovascular intact Incision: dressing C/D/I Compartment soft But does have swelling into the right foot and ankle.  Tolerates only a little movement of foot and ankle.  LABS  Basename 12/15/11 0424 12/14/11 0425 12/13/11 1050  HGB 12.6* 12.8* 15.0  HCT 36.8* 36.3* 43.0  WBC 11.7* 14.2* 10.2  PLT 338 376 419*     Basename 12/15/11 0424 12/14/11 0425 12/13/11 1050  NA 135 132* 138  K 3.3* 3.8 3.9  BUN 5* 13 20  CREATININE 0.60 0.64 0.74  GLUCOSE 113* 101* 98     Assessment/Plan: 2 Days Post-Op Procedure(s) (LRB): OPEN REDUCTION INTERNAL FIXATION (ORIF) TIBIAL PLATEAU (Right)   Up with therapy D/C IV fluids Discharge home with home health today after PT   [name] Mcdonald. [name] Mcdonald   PAC  12/15/2011, 9:19 AM

## 2011-12-15 NOTE — Progress Notes (Signed)
Start of [facility] care day after d/c

## 2011-12-19 ENCOUNTER — Emergency Department (HOSPITAL_COMMUNITY)
Admission: EM | Admit: 2011-12-19 | Discharge: 2011-12-19 | Disposition: A | Discharge status: Home / Self Care | Payer: Medicare Other | Attending: Emergency Medicine | Admitting: Emergency Medicine

## 2011-12-19 ENCOUNTER — Encounter (HOSPITAL_COMMUNITY): Payer: Self-pay

## 2011-12-19 DIAGNOSIS — M25561 Pain in right knee: Secondary | ICD-10-CM

## 2011-12-19 DIAGNOSIS — Z981 Arthrodesis status: Secondary | ICD-10-CM | POA: Insufficient documentation

## 2011-12-19 DIAGNOSIS — M25569 Pain in unspecified knee: Secondary | ICD-10-CM | POA: Insufficient documentation

## 2011-12-19 DIAGNOSIS — F17200 Nicotine dependence, unspecified, uncomplicated: Secondary | ICD-10-CM | POA: Insufficient documentation

## 2011-12-19 DIAGNOSIS — G8918 Other acute postprocedural pain: Secondary | ICD-10-CM | POA: Insufficient documentation

## 2011-12-19 MED ORDER — OXYCODONE-ACETAMINOPHEN 5-325 MG PO TABS: 2.0000 | ORAL_TABLET | ORAL | Status: DC | PRN

## 2011-12-19 MED ORDER — OXYCODONE-ACETAMINOPHEN 5-325 MG PO TABS
2.0000 | ORAL_TABLET | ORAL | Status: AC
  Administered 2011-12-19: 2 via ORAL
  Filled 2011-12-19 (×2): qty 1

## 2011-12-19 NOTE — ED Notes (Signed)
Pt reports he has a history of right leg surgery and is out of his pain medications. Pt is here for a refill

## 2011-12-19 NOTE — ED Provider Notes (Signed)
History     CSN: [id number]  Arrival date & time 12/19/11  1615   First MD Initiated Contact with Patient 12/19/11 1843      Chief Complaint  Patient presents with  . Leg Pain    (Consider location/radiation/quality/duration/timing/severity/associated sxs/prior treatment) Patient is a 41 y.o. male presenting with leg pain. The history is provided by the patient.  Leg Pain  The incident occurred more than 1 week ago.  Pt states he had a surgery on right knee by Dr. Charlann Boxer two weeks ago. States ran out of the medications. States knee is healing well, no signs of infection, no drainage from the incision. States not due to see Dr. Charlann Boxer for another 2 wks. States will call tomorrow. States he has been doubling up on his percocet 5s because pain has been so severe. Denies fever, chills, malaise.  Past Medical History  Diagnosis Date  . Depression   . GERD (gastroesophageal reflux disease)   . Migraine   . Ulcer     hx of stomach ulcer  . 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  [name] History of meningitis 2000    Past Surgical History  Procedure Date  . Brain surgery 2000    Removed growth?  . Orif tibia plateau 12/13/2011    Procedure: OPEN REDUCTION INTERNAL FIXATION (ORIF) TIBIAL PLATEAU;  Surgeon: Shelda Pal;  Location: [facility];  Service: Orthopedics;  Laterality: Right;    Family History  Problem Relation Age of Onset  . Arthritis Mother   . COPD Mother   . Emphysema Mother   . Heart disease Mother   . Depression Father   . Diabetes Maternal Aunt   . Diabetes Maternal Uncle     History  Substance Use Topics  . Smoking status: Current Everyday Smoker -- 1.5 packs/day    Types: Cigarettes    Last Attempt to Quit: 05/31/2011  . Smokeless tobacco: Never Used  . Alcohol Use: No      Review of Systems  Constitutional: Negative.   HENT: Negative.   Eyes: Negative.   Respiratory: Negative.   Cardiovascular: Negative.     Gastrointestinal: Negative.   Genitourinary: Negative.   Musculoskeletal: Positive for joint swelling.  Skin: Negative.   Neurological: Negative.   Psychiatric/Behavioral: Negative.     Allergies  Phenobarbital; Phenytoin; Tegretol; and Topamax  Home Medications   Current Outpatient Rx  Name Route Sig Dispense Refill  . ASPIRIN EC 325 MG PO TBEC Oral Take 1 tablet (325 mg total) by mouth 2 (two) times daily. X 4 weeks. Start after finishing the Lovenox.  0  . CLONAZEPAM 2 MG PO TABS Oral Take 2 mg by mouth 2 (two) times daily as needed. Panic attacks    . DIPHENHYDRAMINE HCL 25 MG PO CAPS Oral Take 1 capsule (25 mg total) by mouth every 6 (six) hours as needed for itching, allergies or sleep.    . DSS 100 MG PO CAPS Oral Take 100 mg by mouth 2 (two) times daily.    . DULOXETINE HCL 60 MG PO CPEP Oral Take 1 capsule (60 mg total) by mouth daily. 30 capsule 2  . ENOXAPARIN SODIUM 40 MG/0.4ML [place] SOLN Subcutaneous Inject 0.4 mLs (40 mg total) into the skin daily. 14 Syringe 0  . FERROUS SULFATE 325 (65 FE) MG PO TABS Oral Take 1 tablet (325 mg total) by mouth 3 (three) times daily after meals.    [name] LEVETIRACETAM 500  MG PO TABS Oral Take 1 tablet (500 mg total) by mouth every 12 (twelve) hours. 60 tablet 2  . ADULT MULTIVITAMIN W/MINERALS CH Oral Take 1 tablet by mouth daily.      . OXYCODONE-ACETAMINOPHEN 5-325 MG PO TABS Oral Take 1-2 tablets by mouth every 4 (four) hours. 120 tablet 0  . POLYETHYLENE GLYCOL 3350 PO PACK Oral Take 17 g by mouth 2 (two) times daily.    [name] RANITIDINE HCL 150 MG PO TABS Oral Take 2 tablets (300 mg total) by mouth 2 (two) times daily. 120 tablet 2  . TIZANIDINE HCL 4 MG PO TABS Oral Take 4 mg by mouth every 8 (eight) hours as needed.        BP 115/71  Pulse 91  Temp(Src) 98 F (36.7 C) (Oral)  Resp 18  SpO2 100%  Physical Exam  Nursing note and vitals reviewed. Constitutional: He is oriented to person, place, and time. He appears well-developed and  well-nourished.  HENT:  Head: Normocephalic and atraumatic.  Eyes: Conjunctivae are normal.  Cardiovascular: Normal rate, regular rhythm and normal heart sounds.   Pulmonary/Chest: Effort normal and breath sounds normal. No respiratory distress.  Musculoskeletal: He exhibits tenderness.       right knee joint swollen. Two large surgical incisions, the lateral one measuring about 20cm, medial measuring 10cm. Staples intact. No signs of infection, no erythema, swelling, drainage.   Neurological: He is alert and oriented to person, place, and time.  Skin: Skin is warm and dry.  Psychiatric: He has a normal mood and affect.    ED Course  Procedures (including critical care time)  Pt with recent surgery. No signs of infection. No new injuries. Will refill pain medications just for several days. Instructed pt to call his orthopedist tomorrow.    MDM          Lottie Mussel, PA 12/19/11 1859

## 2011-12-19 NOTE — ED Notes (Signed)
Pt in from home with pain to the right leg states recent leg surg and out of pain medications pt presents with staples to the right leg wound is intact no drainage noted no redness present

## 2011-12-20 ENCOUNTER — Ambulatory Visit: Payer: Medicare Other | Admitting: Neurology

## 2011-12-20 NOTE — ED Provider Notes (Signed)
Medical screening examination/treatment/procedure(s) were performed by non-physician practitioner and as supervising physician I was immediately available for consultation/collaboration.  Toleen Lachapelle, MD 12/20/11 0119 

## 2011-12-22 ENCOUNTER — Emergency Department (HOSPITAL_COMMUNITY)
Admission: EM | Admit: 2011-12-22 | Discharge: 2011-12-22 | Discharge status: Against Medical Advice | Payer: Medicare Other | Attending: Emergency Medicine | Admitting: Emergency Medicine

## 2011-12-22 ENCOUNTER — Emergency Department (HOSPITAL_COMMUNITY)
Admission: EM | Admit: 2011-12-22 | Discharge: 2011-12-22 | Disposition: A | Discharge status: Home / Self Care | Payer: Medicare Other | Attending: Emergency Medicine | Admitting: Emergency Medicine

## 2011-12-22 ENCOUNTER — Emergency Department (HOSPITAL_COMMUNITY): Payer: Medicare Other

## 2011-12-22 DIAGNOSIS — R296 Repeated falls: Secondary | ICD-10-CM | POA: Insufficient documentation

## 2011-12-22 DIAGNOSIS — S99919A Unspecified injury of unspecified ankle, initial encounter: Secondary | ICD-10-CM | POA: Insufficient documentation

## 2011-12-22 DIAGNOSIS — S8990XA Unspecified injury of unspecified lower leg, initial encounter: Secondary | ICD-10-CM | POA: Insufficient documentation

## 2011-12-22 DIAGNOSIS — R569 Unspecified convulsions: Secondary | ICD-10-CM | POA: Insufficient documentation

## 2011-12-22 DIAGNOSIS — K219 Gastro-esophageal reflux disease without esophagitis: Secondary | ICD-10-CM | POA: Insufficient documentation

## 2011-12-22 DIAGNOSIS — Z0389 Encounter for observation for other suspected diseases and conditions ruled out: Secondary | ICD-10-CM | POA: Insufficient documentation

## 2011-12-22 DIAGNOSIS — M25569 Pain in unspecified knee: Secondary | ICD-10-CM | POA: Insufficient documentation

## 2011-12-22 MED ORDER — OXYCODONE-ACETAMINOPHEN 5-325 MG PO TABS: 2.0000 | ORAL_TABLET | Freq: Once | ORAL | Status: AC

## 2011-12-22 MED ORDER — OXYCODONE-ACETAMINOPHEN 5-325 MG PO TABS
2.0000 | ORAL_TABLET | Freq: Once | ORAL | Status: AC
  Administered 2011-12-22: 2 via ORAL
  Filled 2011-12-22 (×2): qty 1

## 2011-12-22 NOTE — ED Notes (Addendum)
Per EMS; pt had surgery one week ago for repair of R tibia; pt was getting up tonight and fell, no c/o pain; per EMS pt is out of pain medicine, and unable to get new prescription per ems; pt vss BP 132/80, HR 80; pt is on lovenox for postop care

## 2011-12-22 NOTE — ED Notes (Signed)
Pt stated that he was leaning down to pick something off the ground and fell on his right leg. Per pt he just had surgery on this leg 1 week ago. He stated the pain and swelling more than it was prior to falling. R knee is red and swollen. Sutures are intact. R knee is warm to touch. Brisk capillary refill, and patient can move toes. Pt stated that he has ran out of pain medication and his insurance will not pay for a refill at this time. Will continue to monitor.

## 2011-12-22 NOTE — ED Notes (Signed)
Report given to Kelly RN. RN had no questions or concerns.  

## 2011-12-22 NOTE — ED Notes (Signed)
Patient waiting MD eval. Staples in right knee intact, slightly red only at staple sites.

## 2011-12-22 NOTE — ED Provider Notes (Addendum)
History     CSN: [id number]  Arrival date & time 12/22/11  0156   None     Chief Complaint  Patient presents with  . Leg Pain     HPI Pt complains of R knee pain. Hx of same in past.  Hx of plataue fx. To that area.  Requesting pain medications.   Denies f/n/v/d.   Past Medical History  Diagnosis Date  . Depression   . GERD (gastroesophageal reflux disease)   . Migraine   . Ulcer     hx of stomach ulcer  . 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  [name] History of meningitis 2000    Past Surgical History  Procedure Date  . Brain surgery 2000    Removed growth?  . Orif tibia plateau 12/13/2011    Procedure: OPEN REDUCTION INTERNAL FIXATION (ORIF) TIBIAL PLATEAU;  Surgeon: Shelda Pal;  Location: [facility];  Service: Orthopedics;  Laterality: Right;    Family History  Problem Relation Age of Onset  . Arthritis Mother   . COPD Mother   . Emphysema Mother   . Heart disease Mother   . Depression Father   . Diabetes Maternal Aunt   . Diabetes Maternal Uncle     History  Substance Use Topics  . Smoking status: Current Everyday Smoker -- 1.5 packs/day    Types: Cigarettes    Last Attempt to Quit: 05/31/2011  . Smokeless tobacco: Never Used  . Alcohol Use: No      Review of Systems  All other systems reviewed and are negative.    Allergies  Phenobarbital; Phenytoin; Tegretol; and Topamax  Home Medications   Current Outpatient Rx  Name Route Sig Dispense Refill  . ASPIRIN EC 325 MG PO TBEC Oral Take 1 tablet (325 mg total) by mouth 2 (two) times daily. X 4 weeks. Start after finishing the Lovenox.  0  . CLONAZEPAM 2 MG PO TABS Oral Take 2 mg by mouth 2 (two) times daily as needed. Panic attacks    . DULOXETINE HCL 60 MG PO CPEP Oral Take 1 capsule (60 mg total) by mouth daily. 30 capsule 2  . ENOXAPARIN SODIUM 40 MG/0.4ML [place] SOLN Subcutaneous Inject 0.4 mLs (40 mg total) into the skin daily. 14 Syringe 0  . LEVETIRACETAM 500  MG PO TABS Oral Take 1 tablet (500 mg total) by mouth every 12 (twelve) hours. 60 tablet 2  . ADULT MULTIVITAMIN W/MINERALS CH Oral Take 1 tablet by mouth daily.      . OXYCODONE-ACETAMINOPHEN 5-325 MG PO TABS Oral Take 1-2 tablets by mouth every 4 (four) hours. 120 tablet 0  . RANITIDINE HCL 150 MG PO TABS Oral Take 2 tablets (300 mg total) by mouth 2 (two) times daily. 120 tablet 2  . TIZANIDINE HCL 4 MG PO TABS Oral Take 4 mg by mouth every 8 (eight) hours as needed.      . OXYCODONE-ACETAMINOPHEN 5-325 MG PO TABS Oral Take 2 tablets by mouth once. 30 tablet 0    BP 114/63  Pulse 90  Temp(Src) 98.3 F (36.8 C) (Oral)  SpO2 99%  Physical Exam  Nursing note and vitals reviewed. Constitutional: He is oriented to person, place, and time. He appears well-developed and well-nourished. No distress.  HENT:  Head: Normocephalic and atraumatic.  Eyes: Pupils are equal, round, and reactive to light.  Neck: Normal range of motion.  Cardiovascular: Normal rate and intact distal pulses.  Pulmonary/Chest: No respiratory distress.  Abdominal: Normal appearance. He exhibits no distension.  Musculoskeletal: Normal range of motion.       Legs: Neurological: He is alert and oriented to person, place, and time. No cranial nerve deficit.  Skin: Skin is warm and dry. No rash noted.  Psychiatric: He has a normal mood and affect. His behavior is normal.    ED Course  Procedures (including critical care time)  Labs Reviewed - No data to display Dg Knee Complete 4 Views Right  12/22/2011  *RADIOLOGY REPORT*  Clinical Data: Fall.  Recent knee surgery.  Tibial plateau fracture.  RIGHT KNEE - COMPLETE 4+ VIEW  Comparison: 12/13/2011 intraoperative fluoroscopy.  Findings: Medial and lateral buttress plate screw fixation of tibial plateau fracture.  No acute osseous abnormality is identified.  The skin staples remain present compatible with prior ORIF.  No hardware fracture or failure.  IMPRESSION: ORIF of a  tibial plateau fracture.  No acute osseous abnormality.  Original Report Authenticated By: Andreas Newport, M.D.     1.  Leg injury 2.  Status post knee surgery    MDM          Nelia Shi, MD 12/22/11 1610  Nelia Shi, MD 12/22/11 9604  Nelia Shi, MD 02/03/12 [phone]

## 2012-01-07 ENCOUNTER — Ambulatory Visit: Payer: Medicare Other | Admitting: Neurology

## 2012-01-25 ENCOUNTER — Emergency Department (HOSPITAL_COMMUNITY)
Admission: EM | Admit: 2012-01-25 | Discharge: 2012-01-25 | Disposition: A | Discharge status: Home / Self Care | Payer: Medicare Other | Attending: Emergency Medicine | Admitting: Emergency Medicine

## 2012-01-25 DIAGNOSIS — K219 Gastro-esophageal reflux disease without esophagitis: Secondary | ICD-10-CM | POA: Insufficient documentation

## 2012-01-25 DIAGNOSIS — G8929 Other chronic pain: Secondary | ICD-10-CM | POA: Insufficient documentation

## 2012-01-25 DIAGNOSIS — M25561 Pain in right knee: Secondary | ICD-10-CM

## 2012-01-25 DIAGNOSIS — M25569 Pain in unspecified knee: Secondary | ICD-10-CM | POA: Insufficient documentation

## 2012-01-25 DIAGNOSIS — R569 Unspecified convulsions: Secondary | ICD-10-CM | POA: Insufficient documentation

## 2012-01-25 MED ORDER — IBUPROFEN 800 MG PO TABS: 800.0000 mg | ORAL_TABLET | Freq: Three times a day (TID) | ORAL | Status: AC

## 2012-01-25 MED ORDER — IBUPROFEN 800 MG PO TABS
800.0000 mg | ORAL_TABLET | Freq: Once | ORAL | Status: AC
  Administered 2012-01-25: 800 mg via ORAL

## 2012-01-25 NOTE — ED Notes (Signed)
Pt states that he was making a sandwich and then he fell landed on his right leg and is having right knee and right leg pain. Leg nor knee not swollen. Pt states tender to touch. CNS intact.

## 2012-01-25 NOTE — ED Notes (Signed)
Pt unhappy with prescribed pain medication regimen.  Pt threatening to use illicit drugs for pain control since his pain was not appropriately treated in his opinion.  Pt counseled by nursing stuff.  MD made aware of situation.  Pt reports did not mean his threats of illicit drug use that they were made in anger.

## 2012-01-25 NOTE — ED Notes (Signed)
Pt stated that he would have a seizure if he did not receive what he deemed was appropriate pain medication.  MD made aware.  Tylenol 650mg  was offered, but refused by patient.

## 2012-01-25 NOTE — ED Provider Notes (Signed)
History     CSN: [id number]  Arrival date & time 01/25/12  0320   First MD Initiated Contact with Patient 01/25/12 [phone]      Chief Complaint  Patient presents with  . Leg Pain    (Consider location/radiation/quality/duration/timing/severity/associated sxs/prior treatment) Patient is a 41 y.o. male presenting with leg pain. The history is provided by the patient.  Leg Pain  The incident occurred 2 days ago. The incident occurred at home. There was no injury mechanism. The pain is present in the right knee. The pain is moderate. The pain has been constant since onset. Pertinent negatives include no numbness, no inability to bear weight, no loss of motion, no muscle weakness, no loss of sensation and no tingling. The symptoms are aggravated by activity. He has tried nothing for the symptoms.   patient presents requesting refill of clonazepam and oxycodone. He has a history of a right tibia plateau fracture managed by Dr.Gioffe with orthopedics. He has ran out of medications and is requesting refill tonight. He states his pain got worse when he was getting up to get his mother sandwich despite using his cane is having increased pain which prompted him to call EMS. Moderate in severity. Patient has not taken any Advil or other pain medications at home tonight. No weakness or numbness. No increased swelling. No other complaints.  Past Medical History  Diagnosis Date  . Depression   . GERD (gastroesophageal reflux disease)   . Migraine   . Ulcer     hx of stomach ulcer  . 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  [name] History of meningitis 2000    Past Surgical History  Procedure Date  . Brain surgery 2000    Removed growth?  . Orif tibia plateau 12/13/2011    Procedure: OPEN REDUCTION INTERNAL FIXATION (ORIF) TIBIAL PLATEAU;  Surgeon: Shelda Pal;  Location: [facility];  Service: Orthopedics;  Laterality: Right;    Family History  Problem Relation Age of  Onset  . Arthritis Mother   . COPD Mother   . Emphysema Mother   . Heart disease Mother   . Depression Father   . Diabetes Maternal Aunt   . Diabetes Maternal Uncle     History  Substance Use Topics  . Smoking status: Current Everyday Smoker -- 1.5 packs/day    Types: Cigarettes    Last Attempt to Quit: 05/31/2011  . Smokeless tobacco: Never Used  . Alcohol Use: No      Review of Systems  Constitutional: Negative for fever and chills.  HENT: Negative for neck pain and neck stiffness.   Eyes: Negative for pain.  Respiratory: Negative for shortness of breath.   Cardiovascular: Negative for chest pain.  Gastrointestinal: Negative for abdominal pain.  Genitourinary: Negative for dysuria.  Musculoskeletal: Negative for back pain.  Skin: Negative for rash.  Neurological: Negative for tingling, numbness and headaches.  All other systems reviewed and are negative.    Allergies  Dilantin; Phenobarbital; Phenytoin; Tegretol; and Topamax  Home Medications   Current Outpatient Rx  Name Route Sig Dispense Refill  . GOODY HEADACHE PO Oral Take 1 packet by mouth daily as needed. For pain    . CLONAZEPAM 2 MG PO TABS Oral Take 2 mg by mouth 2 (two) times daily as needed. For Panic attacks and seizures    . DULOXETINE HCL 60 MG PO CPEP Oral Take 1 capsule (60 mg total) by mouth daily. 30 capsule  2  . IBUPROFEN-DIPHENHYDRAMINE CIT 200-38 MG PO TABS Oral Take 2 tablets by mouth at bedtime as needed. For pain or sleep    . ADULT MULTIVITAMIN W/MINERALS CH Oral Take 1 tablet by mouth daily.      . OXYCODONE-ACETAMINOPHEN 10-325 MG PO TABS Oral Take 1 tablet by mouth every 4 (four) hours as needed. For pain    . RANITIDINE HCL 150 MG PO TABS Oral Take 2 tablets (300 mg total) by mouth 2 (two) times daily. 120 tablet 2  . TIZANIDINE HCL 4 MG PO TABS Oral Take 4 mg by mouth every 8 (eight) hours as needed.      [name] LEVETIRACETAM 500 MG PO TABS Oral Take 1 tablet (500 mg total) by mouth every  12 (twelve) hours. 60 tablet 2    BP 99/65  Pulse 90  Temp(Src) 98.4 F (36.9 C) (Oral)  Resp 19  SpO2 98%  Physical Exam  Constitutional: He is oriented to person, place, and time. He appears well-developed and well-nourished.  HENT:  Head: Normocephalic and atraumatic.  Eyes: Conjunctivae and EOM are normal. Pupils are equal, round, and reactive to light.  Neck: Trachea normal. Neck supple. No thyromegaly present.  Cardiovascular: Normal rate, regular rhythm, S1 normal, S2 normal and normal pulses.     No systolic murmur is present   No diastolic murmur is present  Pulses:      Radial pulses are 2+ on the right side, and 2+ on the left side.  Pulmonary/Chest: Effort normal and breath sounds normal. He has no wheezes. He has no rhonchi. He has no rales. He exhibits no tenderness.  Abdominal: Soft. Normal appearance and bowel sounds are normal. There is no tenderness. There is no CVA tenderness and negative Murphy's sign.  Musculoskeletal:       Right lower extremity: Well-healed surgical scars over the right patella with mild edema. No erythema or increased warmth to touch. Good range of motion at the knee. Distal neurovascular is intact. No tenderness to hip or ankle. No joint instability.  Neurological: He is alert and oriented to person, place, and time. He has normal strength. No cranial nerve deficit or sensory deficit. GCS eye subscore is 4. GCS verbal subscore is 5. GCS motor subscore is 6.  Skin: Skin is warm and dry. No rash noted. He is not diaphoretic.  Psychiatric: He has a normal mood and affect. His speech is normal.    ED Course  Procedures (including critical care time)  Records reviewed and patient had an x-ray of his right knee 3 days ago without acute abnormality.   Ice, Ace wrap and Motrin for pain. MDM   Pain medications provided as above. No narcotics. Plan followup with orthopedics as needed and primary care physician Dr. Candida Peeling as an outpatient. No  indication for repeat films or further workup at this time.        Sunnie Nielsen, MD 01/25/12 [phone]

## 2012-01-25 NOTE — Discharge Instructions (Signed)
Chronic Pain °Chronic pain can be defined as pain that is lasting, off and on, and lasts for 3 to 6 months or longer. Many things cause chronic pain, which can make it difficult to make a discrete diagnosis. There are many treatment options available for chronic pain. However, finding a treatment that works well for you may require trying various approaches until a suitable one is found. °CAUSES  °In some types of chronic medical conditions, the pain is caused by a normal pain response within the body. A normal pain response helps the body identify illness or injury and prevent further damage from being done. In these cases, the cause of the pain may be identified and treated, even if it may not be cured completely. Examples of chronic conditions which can cause chronic pain include: °· Inflammation of the joints (arthritis).  °· Back pain or neck pain (including bulging or herniated disks).  °· Migraine headaches.  °· Cancer.  °In some other types of chronic pain syndromes, the pain is caused by an abnormal pain response within the body. An abnormal pain response is present when there is no ongoing cause (or stimulus) for the pain, or when the cause of the pain is arising from the nerves or nervous system itself. Examples of conditions which can cause chronic pain due to an abnormal pain response include: °· Fibromyalgia.  °· Reflex sympathetic dystrophy (RSD).  °· Neuropathy (when the nerves themselves are damaged, and may cause pain).  °DIAGNOSIS  °Your caregiver will help diagnose your condition over time. In many cases, the initial focus will be on excluding conditions that could be causing the pain. Depending on your symptoms, your caregiver may order some tests to diagnose your condition. Some of these tests include: °· Blood tests.  °· Computerized X-ray scans (CT scan).  °· Computerized magnetic scans (MRI).  °· X-rays.  °· Ultrasounds.  °· Nerve conduction studies.  °· Consultation with other physicians or  specialists.  °TREATMENT  °There are many treatment options for people suffering from chronic pain. Finding a treatment that works well may take time.  °· You may be referred to a pain management specialist.  °· You may be put on medication to help with the pain. Unfortunately, some medications (such as opiate medications) may not be very effective in cases where chronic pain is due to abnormal pain responses. Finding the right medications can take some time.  °· Adjunctive therapies may be used to provide additional relief and improve a patient's quality of life. These therapies include:  °· Mindfulness meditation.  °· Acupuncture.  °· Biofeedback.  °· Cognitive-behavioral therapy.  °· In certain cases, surgical interventions may be attempted.  °HOME CARE INSTRUCTIONS  °· Make sure you understand these instructions prior to discharge.  °· Ask any questions and share any further concerns you have with your caregiver prior to discharge.  °· Take all medications as directed by your caregiver.  °· Keep all follow-up appointments.  °SEEK MEDICAL CARE IF:  °· Your pain gets worse.  °· You develop a new pain that was not present before.  °· You cannot tolerate any medications prescribed by your caregiver.  °· You develop new symptoms since your last visit with your caregiver.  °SEEK IMMEDIATE MEDICAL CARE IF:  °· You develop muscular weakness.  °· You have decreased sensation or numbness.  °· You lose control of bowel or bladder function.  °· Your pain suddenly gets much worse.  °· You have an oral   temperature above 102° F (38.9° C), not controlled by medication.  °· You develop shaking chills, confusion, chest pain, or shortness of breath.  °Document Released: 08/21/2002 Document Revised: 08/11/2011 Document Reviewed: 11/27/2008 °[employer]® Patient Information ©2012 [employer]. °

## 2012-01-25 NOTE — ED Notes (Signed)
Pt is no longer expressing thoughts of self injury and dangerous behaviors.  Pt was counseled about follow up pain management and pain control resources.

## 2012-01-27 ENCOUNTER — Telehealth: Payer: Self-pay | Admitting: Family

## 2012-01-27 NOTE — Telephone Encounter (Signed)
It sounds like if he has been dismissed from [facility] that he has been dismissed from the whole practice- do you agree?

## 2012-01-27 NOTE — Telephone Encounter (Signed)
Patient states he would like to be seen at our practice. Has been discharged from [facility] and [facility] offices due to no show/cancellations. Patient states he uses Medicare transportation and has had some immediate health concerns/surgeris and this is reason for no showing. Patient also states he has gran mal seizures and is almost out of his medication. Patient states he will pay cash and not use his Medicare/Medicaid insurance if necessary. Please adivse.

## 2012-02-18 NOTE — Telephone Encounter (Signed)
Information sent to HIM 2/14.  Should be dismissed from all [place].

## 2012-03-23 ENCOUNTER — Encounter (HOSPITAL_COMMUNITY): Payer: Self-pay | Admitting: *Deleted

## 2012-03-23 ENCOUNTER — Emergency Department (HOSPITAL_COMMUNITY)
Admission: EM | Admit: 2012-03-23 | Discharge: 2012-03-23 | Disposition: A | Discharge status: Home / Self Care | Payer: Medicare Other | Attending: Emergency Medicine | Admitting: Emergency Medicine

## 2012-03-23 DIAGNOSIS — R6884 Jaw pain: Secondary | ICD-10-CM | POA: Insufficient documentation

## 2012-03-23 NOTE — ED Notes (Signed)
Patient states that he was eating a hamburger x 1 hour ago and has had onset of jaw pain since.

## 2012-03-24 ENCOUNTER — Encounter (HOSPITAL_COMMUNITY): Payer: Self-pay | Admitting: *Deleted

## 2012-03-24 ENCOUNTER — Emergency Department (HOSPITAL_COMMUNITY)
Admission: EM | Admit: 2012-03-24 | Discharge: 2012-03-25 | Disposition: A | Discharge status: Home / Self Care | Payer: Medicare Other | Attending: Emergency Medicine | Admitting: Emergency Medicine

## 2012-03-24 DIAGNOSIS — G8929 Other chronic pain: Secondary | ICD-10-CM | POA: Insufficient documentation

## 2012-03-24 DIAGNOSIS — M25569 Pain in unspecified knee: Secondary | ICD-10-CM

## 2012-03-24 DIAGNOSIS — K219 Gastro-esophageal reflux disease without esophagitis: Secondary | ICD-10-CM | POA: Insufficient documentation

## 2012-03-24 NOTE — ED Notes (Signed)
Rt knee pain today after he had a seizure.  Seizure history

## 2012-03-25 ENCOUNTER — Emergency Department (HOSPITAL_COMMUNITY): Payer: Medicare Other

## 2012-03-25 ENCOUNTER — Emergency Department (HOSPITAL_COMMUNITY)
Admission: EM | Admit: 2012-03-25 | Discharge: 2012-03-25 | Disposition: A | Discharge status: Home / Self Care | Payer: Medicare Other | Attending: Emergency Medicine | Admitting: Emergency Medicine

## 2012-03-25 DIAGNOSIS — Z9889 Other specified postprocedural states: Secondary | ICD-10-CM | POA: Insufficient documentation

## 2012-03-25 DIAGNOSIS — Z765 Malingerer [conscious simulation]: Secondary | ICD-10-CM

## 2012-03-25 DIAGNOSIS — M25569 Pain in unspecified knee: Secondary | ICD-10-CM | POA: Insufficient documentation

## 2012-03-25 DIAGNOSIS — Z8781 Personal history of (healed) traumatic fracture: Secondary | ICD-10-CM | POA: Insufficient documentation

## 2012-03-25 DIAGNOSIS — R569 Unspecified convulsions: Secondary | ICD-10-CM

## 2012-03-25 DIAGNOSIS — G40909 Epilepsy, unspecified, not intractable, without status epilepticus: Secondary | ICD-10-CM | POA: Insufficient documentation

## 2012-03-25 DIAGNOSIS — F1910 Other psychoactive substance abuse, uncomplicated: Secondary | ICD-10-CM | POA: Insufficient documentation

## 2012-03-25 LAB — POCT I-STAT, CHEM 8
BUN: 5 mg/dL — ABNORMAL LOW (ref 6–23)
Calcium, Ion: 1.28 mmol/L (ref 1.12–1.32)
Creatinine, Ser: 0.8 mg/dL (ref 0.50–1.35)
Glucose, Bld: 88 mg/dL (ref 70–99)
TCO2: 24 mmol/L (ref 0–100)

## 2012-03-25 MED ORDER — TRAMADOL HCL 50 MG PO TABS: 50.0000 mg | ORAL_TABLET | Freq: Four times a day (QID) | ORAL | Status: AC | PRN

## 2012-03-25 MED ORDER — IBUPROFEN 800 MG PO TABS
800.0000 mg | ORAL_TABLET | Freq: Once | ORAL | Status: AC
  Administered 2012-03-25: 800 mg via ORAL
  Filled 2012-03-25: qty 1

## 2012-03-25 MED ORDER — CLONAZEPAM 0.5 MG PO TABS
ORAL_TABLET | ORAL | Status: AC
  Filled 2012-03-25: qty 3

## 2012-03-25 MED ORDER — IBUPROFEN 800 MG PO TABS: 800.0000 mg | ORAL_TABLET | Freq: Three times a day (TID) | ORAL | Status: AC

## 2012-03-25 MED ORDER — LEVETIRACETAM 500 MG PO TABS: 500.0000 mg | ORAL_TABLET | Freq: Two times a day (BID) | ORAL | Status: DC

## 2012-03-25 MED ORDER — TRAMADOL HCL 50 MG PO TABS
50.0000 mg | ORAL_TABLET | Freq: Once | ORAL | Status: AC
  Administered 2012-03-25: 50 mg via ORAL
  Filled 2012-03-25: qty 1

## 2012-03-25 MED ORDER — LEVETIRACETAM 500 MG PO TABS
500.0000 mg | ORAL_TABLET | Freq: Once | ORAL | Status: AC
  Administered 2012-03-25: 500 mg via ORAL
  Filled 2012-03-25 (×2): qty 1

## 2012-03-25 MED ORDER — CLONAZEPAM 0.5 MG PO TABS
2.0000 mg | ORAL_TABLET | Freq: Once | ORAL | Status: AC
  Administered 2012-03-25: 2 mg via ORAL
  Filled 2012-03-25: qty 1

## 2012-03-25 MED ORDER — OXYCODONE-ACETAMINOPHEN 5-325 MG PO TABS: 2.0000 | ORAL_TABLET | Freq: Once | ORAL | Status: DC

## 2012-03-25 MED ORDER — CLONAZEPAM 0.5 MG PO TABS: 2.0000 mg | ORAL_TABLET | Freq: Three times a day (TID) | ORAL | Status: DC | PRN

## 2012-03-25 NOTE — ED Provider Notes (Signed)
Medical screening examination/treatment/procedure(s) were performed by non-physician practitioner and as supervising physician I was immediately available for consultation/collaboration.  Olivia Mackie, MD 03/25/12 2135

## 2012-03-25 NOTE — ED Notes (Signed)
Patient left hospital without waiting on discharge paperwork; PA notified.

## 2012-03-25 NOTE — ED Notes (Signed)
OZH:[id number]<HQ> Expected date:<BR> Expected time: 3:02 PM<BR> Means of arrival:Ambulance<BR> Comments:<BR> M41 -- Seizure, Knee Pain

## 2012-03-25 NOTE — ED Notes (Signed)
Went to discharge patient; patient not in room.  Will check again.

## 2012-03-25 NOTE — ED Provider Notes (Signed)
History     CSN: [id number]  Arrival date & time 03/25/12  1526   First MD Initiated Contact with Patient 03/25/12 1532      Chief Complaint  Patient presents with  . Knee Pain  . Medication Refill    (Consider location/radiation/quality/duration/timing/severity/associated sxs/prior treatment) HPI  H/o seizure d/o, Rt knee pain pw multiple complaints. States that he had grand mal seizure today typical of his usual seizure (last 2 months ago). Unsure how long it lasted. Denies head trauma. Witnessed by his mother's friend. Felt typical auro of lightheadedness prior to seizure today and post seizure headache which is typical as well- occipital radiating to forehead. Also c/o rt knee pain since tib plat fx with metal plates inserted. States "I feel like the bone is grinding on my knee", worse after seizure. Witnesses state that he fell onto the knee. Has walked with limp since seizure this morning. Denies new numbness/tingling/weakness of ext. Pain currently 10/10. No relief with BC powder and ibuprofen at home. Feels more "panicky" than usual since he ran out of his clonazepam 3 days ago.  Keppra 500mg  bid has been complaint   Seen here last night with XR knee-- chronic non healing tibial plat fx.  ED Notes, ED Provider Notes from 03/25/12 0000 to 03/25/12 15:24:04       Marla J Harn 03/25/2012 15:19      Pt C/O pain to right knee and it started at 2:00 He had an unwitnessed grandmal seizures Then he c/o pain to knee and knee grinding metal on metal also needs all scripts renewed.         Corrin Parker, RN 03/25/2012 03:21      Patient left hospital without waiting on discharge paperwork; PA notified.         Corrin Parker, RN 03/25/2012 03:14      Went to discharge patient; patient not in room. Will check again.     Past Medical History  Diagnosis Date  . Depression   . GERD (gastroesophageal reflux disease)   . Migraine   . Ulcer     hx of stomach ulcer  . 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  [name] History of meningitis 2000    Past Surgical History  Procedure Date  . Brain surgery 2000    Removed growth?  . Orif tibia plateau 12/13/2011    Procedure: OPEN REDUCTION INTERNAL FIXATION (ORIF) TIBIAL PLATEAU;  Surgeon: Shelda Pal;  Location: [facility];  Service: Orthopedics;  Laterality: Right;    Family History  Problem Relation Age of Onset  . Arthritis Mother   . COPD Mother   . Emphysema Mother   . Heart disease Mother   . Depression Father   . Diabetes Maternal Aunt   . Diabetes Maternal Uncle     History  Substance Use Topics  . Smoking status: Current Everyday Smoker -- 1.5 packs/day    Types: Cigarettes    Last Attempt to Quit: 05/31/2011  . Smokeless tobacco: Never Used  . Alcohol Use: No    Review of Systems  All other systems reviewed and are negative.  except as noted HPI   Allergies  Phenobarbital; Phenytoin; Tegretol; and Topamax  Home Medications   Current Outpatient Rx  Name Route Sig Dispense Refill  . CLONAZEPAM 2 MG PO TABS Oral Take 2 mg by mouth 4 (four) times daily.     . DULOXETINE HCL 60 MG PO  CPEP Oral Take 1 capsule (60 mg total) by mouth daily. 30 capsule 2  . IBUPROFEN 400 MG PO TABS Oral Take 400 mg by mouth every 6 (six) hours as needed. For pain relief    . ADULT MULTIVITAMIN W/MINERALS CH Oral Take 1 tablet by mouth daily.     [name] RANITIDINE HCL 150 MG PO TABS Oral Take 300 mg by mouth 3 (three) times daily as needed. For heartburn    . CLONAZEPAM 0.5 MG PO TABS Oral Take 4 tablets (2 mg total) by mouth 3 (three) times daily as needed for anxiety. 6 tablet 0  . IBUPROFEN 800 MG PO TABS Oral Take 1 tablet (800 mg total) by mouth 3 (three) times daily. 21 tablet 0  . LEVETIRACETAM 500 MG PO TABS Oral Take 1 tablet (500 mg total) by mouth 2 (two) times daily. 30 tablet 0  . TRAMADOL HCL 50 MG PO TABS Oral Take 1 tablet (50 mg total) by mouth every 6 (six) hours as needed for  pain. 6 tablet 0    BP 112/69  Pulse 63  Temp(Src) 98.8 F (37.1 C) (Oral)  Resp 18  SpO2 96%  Physical Exam  Nursing note and vitals reviewed. Constitutional: He is oriented to person, place, and time. He appears well-developed and well-nourished. No distress.  HENT:  Head: Atraumatic.  Mouth/Throat: Oropharynx is clear and moist.  Eyes: Conjunctivae are normal. Pupils are equal, round, and reactive to light.  Neck: Neck supple.  Cardiovascular: Normal rate, regular rhythm, normal heart sounds and intact distal pulses.  Exam reveals no gallop and no friction rub.   No murmur heard. Pulmonary/Chest: Effort normal. No respiratory distress. He has no wheezes. He has no rales.  Abdominal: Soft. Bowel sounds are normal. There is no tenderness. There is no rebound and no guarding.  Musculoskeletal: Normal range of motion. He exhibits no edema and no tenderness.       Diffuse ttp R knee. Well healing surgical scars, no crepitus. Full ROM with min pain Distal pulses intact Gross sensation intact  Neurological: He is alert and oriented to person, place, and time. No cranial nerve deficit. He exhibits normal muscle tone. Coordination normal.       Strength 5/5 all extremities No pronator drift No facial droop   Skin: Skin is warm and dry.  Psychiatric: He has a normal mood and affect.    ED Course  Procedures (including critical care time)  Labs Reviewed  POCT I-STAT, CHEM 8 - Abnormal; Notable for the following:    BUN 5 (*)    All other components within normal limits  LEVETIRACETAM LEVEL   Dg Knee Complete 4 Views Right  03/25/2012  *RADIOLOGY REPORT*  Clinical Data: Recent fall.  Knee pain.  Recent surgery for medial plateau fracture.  RIGHT KNEE - COMPLETE 4+ VIEW  Comparison: 12/22/2011  Findings: Proximal tibial fixation plate and screws are again seen and appear intact.  A medial tibial plateau fracture is again seen which remains unhealed.  Fracture fragments remain in  near anatomic alignment.  No other fracture identified.  No evidence of knee joint effusion.  IMPRESSION: Persistent nonhealed medial tibial plateau fracture, which remains in near anatomic alignment.  Internal fixation hardware remains intact.  Original Report Authenticated By: Danae Orleans, M.D.     1. Seizure   2. Knee pain   3. Drug-seeking behavior     MDM  Reported seizure, typical for him. Given Keppra in ED and will  f/u with neurology. Chronic Rt knee pain with last night's XR as above. Suspect some drug seeking behavior. Home with a few clonazepam, ultram, ibuprofen. He will need to f/u with neurology and Ortho, PMD as outpatient.        Forbes Cellar, MD 03/25/12 [phone]

## 2012-03-25 NOTE — ED Provider Notes (Signed)
History     CSN: [id number]  Arrival date & time 03/24/12  2159   First MD Initiated Contact with Patient 03/25/12 0030      Chief Complaint  Patient presents with  . Knee Pain    (Consider location/radiation/quality/duration/timing/severity/associated sxs/prior treatment) HPI Comments: Patient with long history of right knee pain presents tonight after he supposedly had a seizure today and injured the right knee - states he is out of his klonopin and pain medication - reports that he sees Dr. Darrelyn Hillock for the knee with past surgery.  No fever chills.    Patient is a 41 y.o. male presenting with knee pain. The history is provided by the patient. No language interpreter was used.  Knee Pain This is a chronic problem. The current episode started today. The problem occurs constantly. The problem has been unchanged. Associated symptoms include arthralgias and joint swelling. Pertinent negatives include no abdominal pain, anorexia, change in bowel habit, chest pain, chills, congestion, coughing, diaphoresis, fatigue, fever, headaches, myalgias, nausea, neck pain, numbness, rash, sore throat, swollen glands, urinary symptoms, vertigo, visual change, vomiting or weakness. The symptoms are aggravated by bending. He has tried NSAIDs for the symptoms. The treatment provided no relief.    Past Medical History  Diagnosis Date  . Depression   . GERD (gastroesophageal reflux disease)   . Migraine   . Ulcer     hx of stomach ulcer  . 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  [name] History of meningitis 2000    Past Surgical History  Procedure Date  . Brain surgery 2000    Removed growth?  . Orif tibia plateau 12/13/2011    Procedure: OPEN REDUCTION INTERNAL FIXATION (ORIF) TIBIAL PLATEAU;  Surgeon: Shelda Pal;  Location: [facility];  Service: Orthopedics;  Laterality: Right;    Family History  Problem Relation Age of Onset  . Arthritis Mother   . COPD Mother     . Emphysema Mother   . Heart disease Mother   . Depression Father   . Diabetes Maternal Aunt   . Diabetes Maternal Uncle     History  Substance Use Topics  . Smoking status: Current Everyday Smoker -- 1.5 packs/day    Types: Cigarettes    Last Attempt to Quit: 05/31/2011  . Smokeless tobacco: Never Used  . Alcohol Use: No      Review of Systems  Constitutional: Negative for fever, chills, diaphoresis and fatigue.  HENT: Negative for congestion, sore throat and neck pain.   Respiratory: Negative for cough.   Cardiovascular: Negative for chest pain.  Gastrointestinal: Negative for nausea, vomiting, abdominal pain, anorexia and change in bowel habit.  Musculoskeletal: Positive for joint swelling and arthralgias. Negative for myalgias.  Skin: Negative for rash.  Neurological: Negative for vertigo, weakness, numbness and headaches.  All other systems reviewed and are negative.    Allergies  Phenobarbital; Phenytoin; Tegretol; and Topamax  Home Medications   Current Outpatient Rx  Name Route Sig Dispense Refill  . CLONAZEPAM 2 MG PO TABS Oral Take 2 mg by mouth 4 (four) times daily.     . DULOXETINE HCL 60 MG PO CPEP Oral Take 1 capsule (60 mg total) by mouth daily. 30 capsule 2  . ADULT MULTIVITAMIN W/MINERALS CH Oral Take 1 tablet by mouth daily.     [name] RANITIDINE HCL 150 MG PO TABS Oral Take 300 mg by mouth 3 (three) times daily as needed. For heartburn  BP 95/64  Pulse 101  Temp(Src) 99.6 F (37.6 C) (Oral)  Resp 22  SpO2 96%  Physical Exam  Nursing note and vitals reviewed. Constitutional: He is oriented to person, place, and time. He appears well-developed and well-nourished. No distress.       Sleeping   HENT:  Head: Normocephalic and atraumatic.  Right Ear: External ear normal.  Left Ear: External ear normal.  Nose: Nose normal.  Mouth/Throat: Oropharynx is clear and moist. No oropharyngeal exudate.  Eyes: Conjunctivae are normal. Pupils are equal,  round, and reactive to light. No scleral icterus.  Neck: Normal range of motion. Neck supple.  Cardiovascular: Normal rate, regular rhythm and normal heart sounds.  Exam reveals no gallop and no friction rub.   No murmur heard. Pulmonary/Chest: Breath sounds normal. No respiratory distress. He has no wheezes. He has no rales. He exhibits no tenderness.  Abdominal: Soft. Bowel sounds are normal. He exhibits no distension. There is no tenderness.  Musculoskeletal:       Right knee: He exhibits decreased range of motion. He exhibits no swelling, no effusion, no ecchymosis, no deformity and no laceration. tenderness found. Medial joint line and lateral joint line tenderness noted.       Legs: Lymphadenopathy:    He has no cervical adenopathy.  Neurological: He is alert and oriented to person, place, and time. No cranial nerve deficit.  Skin: Skin is warm and dry. No rash noted. No erythema. No pallor.  Psychiatric: He has a normal mood and affect. His behavior is normal. Judgment and thought content normal.    ED Course  Procedures (including critical care time)  Labs Reviewed - No data to display Dg Knee Complete 4 Views Right  03/25/2012  *RADIOLOGY REPORT*  Clinical Data: Recent fall.  Knee pain.  Recent surgery for medial plateau fracture.  RIGHT KNEE - COMPLETE 4+ VIEW  Comparison: 12/22/2011  Findings: Proximal tibial fixation plate and screws are again seen and appear intact.  A medial tibial plateau fracture is again seen which remains unhealed.  Fracture fragments remain in near anatomic alignment.  No other fracture identified.  No evidence of knee joint effusion.  IMPRESSION: Persistent nonhealed medial tibial plateau fracture, which remains in near anatomic alignment.  Internal fixation hardware remains intact.  Original Report Authenticated By: Danae Orleans, M.D.     Right knee pain    MDM  Patient here with right knee pain - looking through his chart, this is a chronic issue.   He sleeps unless he is awakened, because of this I have asked that he follow up with Dr. Darrelyn Hillock for the chronic pain, I have asked that he follow up with his PCP for refills of the klonopin.        Izola Price [place], [place] 03/25/12 [phone]

## 2012-03-25 NOTE — Discharge Instructions (Signed)
Arthralgia Your caregiver has diagnosed you as suffering from an arthralgia. Arthralgia means there is pain in a joint. This can come from many reasons including:  Bruising the joint which causes soreness (inflammation) in the joint.   Wear and tear on the joints which occur as we grow older (osteoarthritis).   Overusing the joint.   Various forms of arthritis.   Infections of the joint.  Regardless of the cause of pain in your joint, most of these different pains respond to anti-inflammatory drugs and rest. The exception to this is when a joint is infected, and these cases are treated with antibiotics, if it is a bacterial infection. HOME CARE INSTRUCTIONS   Rest the injured area for as long as directed by your caregiver. Then slowly start using the joint as directed by your caregiver and as the pain allows. Crutches as directed may be useful if the ankles, knees or hips are involved. If the knee was splinted or casted, continue use and care as directed. If an stretchy or elastic wrapping bandage has been applied today, it should be removed and re-applied every 3 to 4 hours. It should not be applied tightly, but firmly enough to keep swelling down. Watch toes and feet for swelling, bluish discoloration, coldness, numbness or excessive pain. If any of these problems (symptoms) occur, remove the ace bandage and re-apply more loosely. If these symptoms persist, contact your caregiver or return to this location.   For the first 24 hours, keep the injured extremity elevated on pillows while lying down.   Apply ice for 15 to 20 minutes to the sore joint every couple hours while awake for the first half day. Then 3 to 4 times per day for the first 48 hours. Put the ice in a plastic bag and place a towel between the bag of ice and your skin.   Wear any splinting, casting, elastic bandage applications, or slings as instructed.   Only take over-the-counter or prescription medicines for pain,  discomfort, or fever as directed by your caregiver. Do not use aspirin immediately after the injury unless instructed by your physician. Aspirin can cause increased bleeding and bruising of the tissues.   If you were given crutches, continue to use them as instructed and do not resume weight bearing on the sore joint until instructed.  Persistent pain and inability to use the sore joint as directed for more than 2 to 3 days are warning signs indicating that you should see a caregiver for a follow-up visit as soon as possible. Initially, a hairline fracture (break in bone) may not be evident on X-rays. Persistent pain and swelling indicate that further evaluation, non-weight bearing or use of the joint (use of crutches or slings as instructed), or further X-rays are indicated. X-rays may sometimes not show a small fracture until a week or 10 days later. Make a follow-up appointment with your own caregiver or one to whom we have referred you. A radiologist (specialist in reading X-rays) may read your X-rays. Make sure you know how you are to obtain your X-ray results. Do not assume everything is normal if you do not hear from us. SEEK MEDICAL CARE IF: Bruising, swelling, or pain increases. SEEK IMMEDIATE MEDICAL CARE IF:   Your fingers or toes are numb or blue.   The pain is not responding to medications and continues to stay the same or get worse.   The pain in your joint becomes severe.   You develop a fever over   102 F (38.9 C).   It becomes impossible to move or use the joint.  MAKE SURE YOU:   Understand these instructions.   Will watch your condition.   Will get help right away if you are not doing well or get worse.  Document Released: 11/29/2005 Document Revised: 11/18/2011 Document Reviewed: 07/17/2008 [employer] Patient Information 2012 [employer].Knee Pain The knee is the complex joint between your thigh and your lower leg. It is made up of bones, tendons, ligaments, and  cartilage. The bones that make up the knee are:  The femur in the thigh.   The tibia and fibula in the lower leg.   The patella or kneecap riding in the groove on the lower femur.  CAUSES  Knee pain is a common complaint with many causes. A few of these causes are:  Injury, such as:   A ruptured ligament or tendon injury.   Torn cartilage.   Medical conditions, such as:   Gout   Arthritis   Infections   Overuse, over training or overdoing a physical activity.  Knee pain can be minor or severe. Knee pain can accompany debilitating injury. Minor knee problems often respond well to self-care measures or get well on their own. More serious injuries may need medical intervention or even surgery. SYMPTOMS The knee is complex. Symptoms of knee problems can vary widely. Some of the problems are:  Pain with movement and weight bearing.   Swelling and tenderness.   Buckling of the knee.   Inability to straighten or extend your knee.   Your knee locks and you cannot straighten it.   Warmth and redness with pain and fever.   Deformity or dislocation of the kneecap.  DIAGNOSIS  Determining what is wrong may be very straight forward such as when there is an injury. It can also be challenging because of the complexity of the knee. Tests to make a diagnosis may include:  Your caregiver taking a history and doing a physical exam.   Routine X-rays can be used to rule out other problems. X-rays will not reveal a cartilage tear. Some injuries of the knee can be diagnosed by:   Arthroscopy a surgical technique by which a small video camera is inserted through tiny incisions on the sides of the knee. This procedure is used to examine and repair internal knee joint problems. Tiny instruments can be used during arthroscopy to repair the torn knee cartilage (meniscus).   Arthrography is a radiology technique. A contrast liquid is directly injected into the knee joint. Internal structures of  the knee joint then become visible on X-ray film.   An MRI scan is a non x-ray radiology procedure in which magnetic fields and a computer produce two- or three-dimensional images of the inside of the knee. Cartilage tears are often visible using an MRI scanner. MRI scans have largely replaced arthrography in diagnosing cartilage tears of the knee.   Blood work.   Examination of the fluid that helps to lubricate the knee joint (synovial fluid). This is done by taking a sample out using a needle and a syringe.  TREATMENT The treatment of knee problems depends on the cause. Some of these treatments are:  Depending on the injury, proper casting, splinting, surgery or physical therapy care will be needed.   Give yourself adequate recovery time. Do not overuse your joints. If you begin to get sore during workout routines, back off. Slow down or do fewer repetitions.   For repetitive activities   such as cycling or running, maintain your strength and nutrition.   Alternate muscle groups. For example if you are a weight lifter, work the upper body on one day and the lower body the next.   Either tight or weak muscles do not give the proper support for your knee. Tight or weak muscles do not absorb the stress placed on the knee joint. Keep the muscles surrounding the knee strong.   Take care of mechanical problems.   If you have flat feet, orthotics or special shoes may help. See your caregiver if you need help.   Arch supports, sometimes with wedges on the inner or outer aspect of the heel, can help. These can shift pressure away from the side of the knee most bothered by osteoarthritis.   A brace called an "unloader" brace also may be used to help ease the pressure on the most arthritic side of the knee.   If your caregiver has prescribed crutches, braces, wraps or ice, use as directed. The acronym for this is PRICE. This means protection, rest, ice, compression and elevation.   Nonsteroidal  anti-inflammatory drugs (NSAID's), can help relieve pain. But if taken immediately after an injury, they may actually increase swelling. Take NSAID's with food in your stomach. Stop them if you develop stomach problems. Do not take these if you have a history of ulcers, stomach pain or bleeding from the bowel. Do not take without your caregiver's approval if you have problems with fluid retention, heart failure, or kidney problems.   For ongoing knee problems, physical therapy may be helpful.   Glucosamine and chondroitin are over-the-counter dietary supplements. Both may help relieve the pain of osteoarthritis in the knee. These medicines are different from the usual anti-inflammatory drugs. Glucosamine may decrease the rate of cartilage destruction.   Injections of a corticosteroid drug into your knee joint may help reduce the symptoms of an arthritis flare-up. They may provide pain relief that lasts a few months. You may have to wait a few months between injections. The injections do have a small increased risk of infection, water retention and elevated blood sugar levels.   Hyaluronic acid injected into damaged joints may ease pain and provide lubrication. These injections may work by reducing inflammation. A series of shots may give relief for as long as 6 months.   Topical painkillers. Applying certain ointments to your skin may help relieve the pain and stiffness of osteoarthritis. Ask your pharmacist for suggestions. Many over the-counter products are approved for temporary relief of arthritis pain.   In some countries, doctors often prescribe topical NSAID's for relief of chronic conditions such as arthritis and tendinitis. A review of treatment with NSAID creams found that they worked as well as oral medications but without the serious side effects.  PREVENTION  Maintain a healthy weight. Extra pounds put more strain on your joints.   Get strong, stay limber. Weak muscles are a common  cause of knee injuries. Stretching is important. Include flexibility exercises in your workouts.   Be smart about exercise. If you have osteoarthritis, chronic knee pain or recurring injuries, you may need to change the way you exercise. This does not mean you have to stop being active. If your knees ache after jogging or playing basketball, consider switching to swimming, water aerobics or other low-impact activities, at least for a few days a week. Sometimes limiting high-impact activities will provide relief.   Make sure your shoes fit well. Choose footwear that is right   for your sport.   Protect your knees. Use the proper gear for knee-sensitive activities. Use kneepads when playing volleyball or laying carpet. Buckle your seat belt every time you drive. Most shattered kneecaps occur in car accidents.   Rest when you are tired.  SEEK MEDICAL CARE IF:  You have knee pain that is continual and does not seem to be getting better.  SEEK IMMEDIATE MEDICAL CARE IF:  Your knee joint feels hot to the touch and you have a high fever. MAKE SURE YOU:   Understand these instructions.   Will watch your condition.   Will get help right away if you are not doing well or get worse.  Document Released: 09/26/2007 Document Revised: 11/18/2011 Document Reviewed: 09/26/2007 [employer] Patient Information 2012 [employer]. 

## 2012-03-25 NOTE — ED Notes (Signed)
Pt standing at the computer pressing buttons, talking on the phone with coffee in his hands. Pt asked not to mess with the computer and asked if I could obtain his blood pressure. Pt telling person on the phone " both of [place] will call Tim Rice in the morning."

## 2012-03-25 NOTE — ED Notes (Signed)
In to D/C pt, Keyboard to computer noted to be filled with coffee, coffee able to be poured out. Pt questioned re this pt states he was at the computer but has no knowledge of coffee. Pt escorted to check out window by security

## 2012-03-25 NOTE — ED Notes (Signed)
Pt pulled out his IV and flung it around the room. Started throwing things and became combative and security called and [employer].  He began cursing again I had given him food and drink and he still acted out.  There was no pleasing him and finally the MD was considering DC when she got the I STAT back

## 2012-03-25 NOTE — ED Notes (Signed)
Pt C/O pain to right knee and it started at 2:00  He had an unwitnessed grandmal seizures   Then he c/o pain to knee and knee grinding metal on metal also needs all scripts renewed.

## 2012-03-25 NOTE — ED Notes (Signed)
This patient from the time he came in was beligerant and cursing this nurse He is wanting everything and became angry that he did not get Percocet and stated he was going to call Saul Fordyce and he would be in here within 15 min and fire all of [place].  He then calmed down when Dr. Ledell Noss spoke with him and wanted a sandwich, coke, crackers, and i gave them to him He ate it all and then began throwing things and then procedes to throw his coffee in the keyboard   when he was asked why he did it he said because he wanted to.  [employer] called by security and pt taken to jail for destruction of property.

## 2012-03-25 NOTE — ED Notes (Addendum)
Pt states " I want to speak with my doctor, I'm ready to go" Pt repeatedly pushing call light button stating "You are not summoning my DrMarland Kitchen

## 2012-03-27 ENCOUNTER — Encounter (HOSPITAL_COMMUNITY): Payer: Self-pay | Admitting: *Deleted

## 2012-03-27 ENCOUNTER — Emergency Department (HOSPITAL_COMMUNITY): Payer: Medicare Other

## 2012-03-27 ENCOUNTER — Emergency Department (HOSPITAL_COMMUNITY)
Admission: EM | Admit: 2012-03-27 | Discharge: 2012-03-27 | Disposition: A | Discharge status: Home / Self Care | Payer: Medicare Other | Attending: Emergency Medicine | Admitting: Emergency Medicine

## 2012-03-27 ENCOUNTER — Other Ambulatory Visit: Payer: Self-pay

## 2012-03-27 DIAGNOSIS — I517 Cardiomegaly: Secondary | ICD-10-CM | POA: Insufficient documentation

## 2012-03-27 DIAGNOSIS — K219 Gastro-esophageal reflux disease without esophagitis: Secondary | ICD-10-CM | POA: Insufficient documentation

## 2012-03-27 DIAGNOSIS — Y92009 Unspecified place in unspecified non-institutional (private) residence as the place of occurrence of the external cause: Secondary | ICD-10-CM | POA: Insufficient documentation

## 2012-03-27 DIAGNOSIS — R569 Unspecified convulsions: Secondary | ICD-10-CM

## 2012-03-27 DIAGNOSIS — F3289 Other specified depressive episodes: Secondary | ICD-10-CM | POA: Insufficient documentation

## 2012-03-27 DIAGNOSIS — F329 Major depressive disorder, single episode, unspecified: Secondary | ICD-10-CM | POA: Insufficient documentation

## 2012-03-27 DIAGNOSIS — W08XXXA Fall from other furniture, initial encounter: Secondary | ICD-10-CM | POA: Insufficient documentation

## 2012-03-27 DIAGNOSIS — G40909 Epilepsy, unspecified, not intractable, without status epilepticus: Secondary | ICD-10-CM | POA: Insufficient documentation

## 2012-03-27 DIAGNOSIS — M542 Cervicalgia: Secondary | ICD-10-CM | POA: Insufficient documentation

## 2012-03-27 DIAGNOSIS — F17200 Nicotine dependence, unspecified, uncomplicated: Secondary | ICD-10-CM | POA: Insufficient documentation

## 2012-03-27 LAB — DIFFERENTIAL
Lymphocytes Relative: 35 % (ref 12–46)
Lymphs Abs: 3.3 10*3/uL (ref 0.7–4.0)
Monocytes Absolute: 0.8 10*3/uL (ref 0.1–1.0)
Monocytes Relative: 8 % (ref 3–12)
Neutro Abs: 4.7 10*3/uL (ref 1.7–7.7)

## 2012-03-27 LAB — COMPREHENSIVE METABOLIC PANEL
AST: 15 U/L (ref 0–37)
Albumin: 3.7 g/dL (ref 3.5–5.2)
Chloride: 107 mEq/L (ref 96–112)
Creatinine, Ser: 0.8 mg/dL (ref 0.50–1.35)
Potassium: 3.7 mEq/L (ref 3.5–5.1)
Total Bilirubin: 0.2 mg/dL — ABNORMAL LOW (ref 0.3–1.2)

## 2012-03-27 LAB — URINALYSIS, ROUTINE W REFLEX MICROSCOPIC
Glucose, UA: 500 mg/dL — AB
Ketones, ur: NEGATIVE mg/dL
Leukocytes, UA: NEGATIVE
pH: 6 (ref 5.0–8.0)

## 2012-03-27 LAB — POCT I-STAT, CHEM 8
Chloride: 111 mEq/L (ref 96–112)
Creatinine, Ser: 0.8 mg/dL (ref 0.50–1.35)
Glucose, Bld: 96 mg/dL (ref 70–99)
Potassium: 3.7 mEq/L (ref 3.5–5.1)

## 2012-03-27 LAB — CBC
HCT: 42.3 % (ref 39.0–52.0)
Hemoglobin: 14.1 g/dL (ref 13.0–17.0)
RBC: 4.46 MIL/uL (ref 4.22–5.81)
WBC: 9.4 10*3/uL (ref 4.0–10.5)

## 2012-03-27 LAB — RAPID URINE DRUG SCREEN, HOSP PERFORMED
Barbiturates: NOT DETECTED
Cocaine: NOT DETECTED
Tetrahydrocannabinol: POSITIVE — AB

## 2012-03-27 MED ORDER — SODIUM CHLORIDE 0.9 % IV SOLN
Freq: Once | INTRAVENOUS | Status: AC
  Administered 2012-03-27: 09:00:00 via INTRAVENOUS

## 2012-03-27 MED ORDER — SODIUM CHLORIDE 0.9 % IV SOLN
1000.0000 mg | Freq: Once | INTRAVENOUS | Status: AC
  Administered 2012-03-27: 1000 mg via INTRAVENOUS
  Filled 2012-03-27: qty 10

## 2012-03-27 MED ORDER — LEVETIRACETAM 500 MG PO TABS: 500.0000 mg | ORAL_TABLET | Freq: Two times a day (BID) | ORAL | Status: DC

## 2012-03-27 MED ORDER — OXYCODONE-ACETAMINOPHEN 5-325 MG PO TABS
1.0000 | ORAL_TABLET | Freq: Once | ORAL | Status: AC
  Administered 2012-03-27: 1 via ORAL
  Filled 2012-03-27: qty 1

## 2012-03-27 MED ORDER — CLONAZEPAM 2 MG PO TABS: 2.0000 mg | ORAL_TABLET | Freq: Three times a day (TID) | ORAL | Status: DC | PRN

## 2012-03-27 NOTE — ED Notes (Signed)
ZOX:[id number]<AV> Expected date:03/27/12<BR> Expected time: 7:15 AM<BR> Means of arrival:Ambulance<BR> Comments:<BR> EMS, elderly

## 2012-03-27 NOTE — ED Notes (Signed)
md at bedside. Pt taken off back board and head blocks removed. c collar still in place.

## 2012-03-27 NOTE — ED Provider Notes (Addendum)
History     CSN: [id number]  Arrival date & time 03/27/12  4696   First MD Initiated Contact with Patient 03/27/12 0757      Chief Complaint  Patient presents with  . Seizures    (Consider location/radiation/quality/duration/timing/severity/associated sxs/prior treatment) Patient is a 41 y.o. male presenting with seizures. The history is provided by the patient and medical records.  Seizures  Pertinent negatives include no confusion, no headaches, no visual disturbance, no chest pain, no cough, no nausea and no vomiting.   the patient is a 41 year old male, with a history of epilepsy.  He takes clonazepam and Keppra for his seizures.  He states that he has not had them in 2 days.  He had a seizure today, and fell, and struck his head.  Now.  He says his neck hurts.  He denies recent illness.  He denies pain anywhere besides his neck.  He denies vision changes, nausea, vomiting, weakness, or paresthesias.  Past Medical History  Diagnosis Date  . Depression   . GERD (gastroesophageal reflux disease)   . Migraine   . Ulcer     hx of stomach ulcer  . 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  [name] History of meningitis 2000    Past Surgical History  Procedure Date  . Brain surgery 2000    Removed growth?  . Orif tibia plateau 12/13/2011    Procedure: OPEN REDUCTION INTERNAL FIXATION (ORIF) TIBIAL PLATEAU;  Surgeon: Shelda Pal;  Location: [facility];  Service: Orthopedics;  Laterality: Right;    Family History  Problem Relation Age of Onset  . Arthritis Mother   . COPD Mother   . Emphysema Mother   . Heart disease Mother   . Depression Father   . Diabetes Maternal Aunt   . Diabetes Maternal Uncle     History  Substance Use Topics  . Smoking status: Current Everyday Smoker -- 1.5 packs/day    Types: Cigarettes    Last Attempt to Quit: 05/31/2011  . Smokeless tobacco: Never Used  . Alcohol Use: Yes      Review of Systems    Constitutional: Negative for fever and chills.  HENT: Positive for neck pain. Negative for nosebleeds.   Eyes: Negative for visual disturbance.  Respiratory: Negative for cough and shortness of breath.   Cardiovascular: Negative for chest pain.  Gastrointestinal: Negative for nausea, vomiting and abdominal pain.  Skin: Negative for rash and wound.  Neurological: Positive for seizures. Negative for weakness, numbness and headaches.  Psychiatric/Behavioral: Negative for confusion.  All other systems reviewed and are negative.    Allergies  Phenobarbital; Phenytoin; Tegretol; and Topamax  Home Medications   Current Outpatient Rx  Name Route Sig Dispense Refill  . CLONAZEPAM 0.5 MG PO TABS Oral Take 4 tablets (2 mg total) by mouth 3 (three) times daily as needed for anxiety. 6 tablet 0  . CLONAZEPAM 2 MG PO TABS Oral Take 2 mg by mouth 4 (four) times daily.     . DULOXETINE HCL 60 MG PO CPEP Oral Take 1 capsule (60 mg total) by mouth daily. 30 capsule 2  . IBUPROFEN 400 MG PO TABS Oral Take 400 mg by mouth every 6 (six) hours as needed. For pain relief    . IBUPROFEN 800 MG PO TABS Oral Take 1 tablet (800 mg total) by mouth 3 (three) times daily. 21 tablet 0  . LEVETIRACETAM 500 MG PO TABS Oral Take 1 tablet (  500 mg total) by mouth 2 (two) times daily. 30 tablet 0  . ADULT MULTIVITAMIN W/MINERALS CH Oral Take 1 tablet by mouth daily.     [name] RANITIDINE HCL 150 MG PO TABS Oral Take 300 mg by mouth 3 (three) times daily as needed. For heartburn    . TRAMADOL HCL 50 MG PO TABS Oral Take 1 tablet (50 mg total) by mouth every 6 (six) hours as needed for pain. 6 tablet 0    BP 117/60  Pulse 77  Temp(Src) 98.7 F (37.1 C) (Oral)  Resp 20  SpO2 99%  Physical Exam  Vitals reviewed. Constitutional: He is oriented to person, place, and time. He appears well-developed and well-nourished. No distress.  HENT:  Head: Normocephalic and atraumatic.  Eyes: Conjunctivae and EOM are normal.   Neck:       Immobilized in c-collar C-spine tenderness  Cardiovascular: Normal rate.   No murmur heard. Pulmonary/Chest: Effort normal. No respiratory distress. He has no rales.  Abdominal: Soft. He exhibits no distension. There is no tenderness.  Musculoskeletal: Normal range of motion.  Neurological: He is alert and oriented to person, place, and time. No cranial nerve deficit.       Moves all 4 extremities  Skin: Skin is warm and dry.  Psychiatric: He has a normal mood and affect. Thought content normal.    ED Course  Procedures (including critical care time) 41 year old male, with epilepsy has not had his medications in 2 days, presents after a seizure.  We will load him with Keppra out of x-ray of his neck since.  She has tenderness.  After fall and we will perform routine laboratory testing for further evaluation  Labs Reviewed  COMPREHENSIVE METABOLIC PANEL - Abnormal; Notable for the following:    Total Bilirubin 0.2 (*)    All other components within normal limits  DIFFERENTIAL - Abnormal; Notable for the following:    Eosinophils Relative 7 (*)    All other components within normal limits  ETHANOL  CBC  GLUCOSE, CAPILLARY  POCT I-STAT, CHEM 8  URINE RAPID DRUG SCREEN (HOSP PERFORMED)  URINALYSIS, ROUTINE W REFLEX MICROSCOPIC   No results found.   No diagnosis found.  ECG Time 0 753 Normal sinus rhythm with a heart rate of 73.  Left atrial enlargement RSR prime in V1 and V2 is probably a normal variant.  No conduction defects.  No arrhythmias, normal axis  MDM  Seizure        Cheri Guppy, MD 03/27/12 1051  Cheri Guppy, MD 03/27/12 1055

## 2012-03-27 NOTE — ED Notes (Addendum)
Per ems pt is from. Alert and oriented x4. Pt reports he was sitting on the couch watching the news and his vision got blurry. Pt says he fell and hit his head. Mother reports pt had 2 seizures, pt was disoriented, all he remembers is being found on the floor. Pt reports headache, back pain, and leg pain. Leg pain from past leg injury. Pt takes klonipin for seizures, and has been out of medicine for 2 days. Pt requested pain meds and "meds to stop the next seizure" during ems transport.

## 2012-03-27 NOTE — Discharge Instructions (Signed)
Your x-ray does not show any injuries to your neck.  Do not use, marijuana, or other illicit drugs.  Use Klonopin to control your anxiety, and Keppra to control seizures.  Followup with your Dr. for reevaluation and removal of your medications.  For your seizures.  Followup with Dr. Magnus Ivan for your knee call for an appointment time

## 2012-03-31 ENCOUNTER — Emergency Department (HOSPITAL_COMMUNITY): Payer: Medicare Other

## 2012-03-31 ENCOUNTER — Encounter (HOSPITAL_COMMUNITY): Payer: Self-pay

## 2012-03-31 ENCOUNTER — Emergency Department (HOSPITAL_COMMUNITY)
Admission: EM | Admit: 2012-03-31 | Discharge: 2012-03-31 | Disposition: A | Discharge status: Home / Self Care | Payer: Medicare Other | Attending: Emergency Medicine | Admitting: Emergency Medicine

## 2012-03-31 ENCOUNTER — Emergency Department (INDEPENDENT_AMBULATORY_CARE_PROVIDER_SITE_OTHER)
Admission: EM | Admit: 2012-03-31 | Discharge: 2012-03-31 | Disposition: A | Discharge status: Home / Self Care | Payer: Medicare Other | Source: Home / Self Care | Attending: Family Medicine | Admitting: Family Medicine

## 2012-03-31 ENCOUNTER — Encounter (HOSPITAL_COMMUNITY): Payer: Self-pay | Admitting: Emergency Medicine

## 2012-03-31 DIAGNOSIS — G8929 Other chronic pain: Secondary | ICD-10-CM

## 2012-03-31 DIAGNOSIS — F329 Major depressive disorder, single episode, unspecified: Secondary | ICD-10-CM | POA: Insufficient documentation

## 2012-03-31 DIAGNOSIS — Z9889 Other specified postprocedural states: Secondary | ICD-10-CM | POA: Insufficient documentation

## 2012-03-31 DIAGNOSIS — F3289 Other specified depressive episodes: Secondary | ICD-10-CM | POA: Insufficient documentation

## 2012-03-31 DIAGNOSIS — F17200 Nicotine dependence, unspecified, uncomplicated: Secondary | ICD-10-CM | POA: Insufficient documentation

## 2012-03-31 DIAGNOSIS — M25569 Pain in unspecified knee: Secondary | ICD-10-CM

## 2012-03-31 DIAGNOSIS — K219 Gastro-esophageal reflux disease without esophagitis: Secondary | ICD-10-CM | POA: Insufficient documentation

## 2012-03-31 DIAGNOSIS — Z79899 Other long term (current) drug therapy: Secondary | ICD-10-CM | POA: Insufficient documentation

## 2012-03-31 MED ORDER — KETOROLAC TROMETHAMINE 60 MG/2ML IM SOLN
60.0000 mg | Freq: Once | INTRAMUSCULAR | Status: AC
  Administered 2012-03-31: 60 mg via INTRAMUSCULAR
  Filled 2012-03-31: qty 2

## 2012-03-31 MED ORDER — MELOXICAM 7.5 MG PO TABS: 7.5000 mg | ORAL_TABLET | Freq: Two times a day (BID) | ORAL | Status: DC

## 2012-03-31 NOTE — ED Notes (Signed)
PT reports that his knees are hurting him and he can't take it anymore. Has appt with new ortho MD Monday.

## 2012-03-31 NOTE — Discharge Instructions (Signed)
Return here as needed.  Follow-up with your orthopedist.  Ice and elevate the knee °

## 2012-03-31 NOTE — ED Provider Notes (Signed)
Medical screening examination/treatment/procedure(s) were performed by non-physician practitioner and as supervising physician I was immediately available for consultation/collaboration.  Jasmine Awe, MD 03/31/12 [phone]

## 2012-03-31 NOTE — ED Provider Notes (Signed)
History     CSN: [id number]  Arrival date & time 03/31/12  0901   First MD Initiated Contact with Patient 03/31/12 [phone]      Chief Complaint  Patient presents with  . Medication Refill    (Consider location/radiation/quality/duration/timing/severity/associated sxs/prior treatment) Patient is a 41 y.o. male presenting with knee pain. The history is provided by the patient.  Knee Pain This is a chronic problem. Episode onset: pt seen today in ER, had x-ray, wants pain medicine, states unable to walk on right leg from pain, , when pt advised that narcotics would not be given , pt got angry and bolted out of wheel chair , cursing and left [facility] under his own power with no apparent  The problem has been gradually worsening.    Past Medical History  Diagnosis Date  . Depression   . GERD (gastroesophageal reflux disease)   . Migraine   . Ulcer     hx of stomach ulcer  . 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  [name] History of meningitis 2000    Past Surgical History  Procedure Date  . Brain surgery 2000    Removed growth?  . Orif tibia plateau 12/13/2011    Procedure: OPEN REDUCTION INTERNAL FIXATION (ORIF) TIBIAL PLATEAU;  Surgeon: Shelda Pal;  Location: [facility];  Service: Orthopedics;  Laterality: Right;    Family History  Problem Relation Age of Onset  . Arthritis Mother   . COPD Mother   . Emphysema Mother   . Heart disease Mother   . Depression Father   . Diabetes Maternal Aunt   . Diabetes Maternal Uncle     History  Substance Use Topics  . Smoking status: Current Everyday Smoker -- 1.5 packs/day    Types: Cigarettes    Last Attempt to Quit: 05/31/2011  . Smokeless tobacco: Never Used  . Alcohol Use: Yes      Review of Systems  Allergies  Paxil; Phenobarbital; Phenytoin; Tegretol; and Topamax  Home Medications   Current Outpatient Rx  Name Route Sig Dispense Refill  . AMPHETAMINE-DEXTROAMPHETAMINE 30 MG PO TABS Oral  Take 30 mg by mouth daily.    Marlin Canary HEADACHE PO Oral Take 1 packet by mouth daily as needed.    [name] CLONAZEPAM 2 MG PO TABS Oral Take 2 mg by mouth 4 (four) times daily.     . DULOXETINE HCL 60 MG PO CPEP Oral Take 1 capsule (60 mg total) by mouth daily. 30 capsule 2  . HYDROMORPHONE HCL 2 MG PO TABS Oral Take 1 mg by mouth every 4 (four) hours as needed. For pain    . IBUPROFEN 800 MG PO TABS Oral Take 1 tablet (800 mg total) by mouth 3 (three) times daily. 21 tablet 0  . LEVETIRACETAM 500 MG PO TABS Oral Take 1 tablet (500 mg total) by mouth every 12 (twelve) hours. 30 tablet 0  . MELOXICAM 7.5 MG PO TABS Oral Take 1 tablet (7.5 mg total) by mouth 2 (two) times daily. 20 tablet 0  . ADULT MULTIVITAMIN W/MINERALS CH Oral Take 1 tablet by mouth daily.     . OXYCODONE-ACETAMINOPHEN 10-325 MG PO TABS Oral Take 1 tablet by mouth every 4 (four) hours as needed. For pain    . PREGABALIN 100 MG PO CAPS Oral Take 100 mg by mouth 2 (two) times daily.    [name] RANITIDINE HCL 150 MG PO TABS Oral Take 300 mg by mouth  3 (three) times daily as needed. For heartburn    . TRAMADOL HCL 50 MG PO TABS Oral Take 1 tablet (50 mg total) by mouth every 6 (six) hours as needed for pain. 6 tablet 0    BP 92/51  Pulse 79  Temp(Src) 97.9 F (36.6 C) (Oral)  Resp 20  SpO2 98%  Physical Exam  Musculoskeletal: Normal range of motion. He exhibits no edema.       Legs:   ED Course  Procedures (including critical care time)  Labs Reviewed - No data to display No results found.   1. Chronic knee pain       MDM          Linna Hoff, MD 04/05/12 2043

## 2012-03-31 NOTE — ED Notes (Signed)
patient came here soon after his release from ED ; patient has reportedly , per patient, been released by his care provider for his orthopaedic issues, and is requesting medication refills and/or pain management C/o pain in his knee. Was reportedly given pain meds in ED, and has given him some relief. Was given RX for NSAID in ED for his pain this AM

## 2012-03-31 NOTE — ED Notes (Signed)
Wound from surgery well healed

## 2012-03-31 NOTE — ED Notes (Signed)
Dr Artis Flock went in to patient room to discuss pt continued  exam and care in [facility]; After brie,f loud, obscene, vocal,, discussion w MD, patient proceeded to exit room with fluid, unimpaired ambulation, no deficits noted in gait . Patient then exited clinic .

## 2012-03-31 NOTE — ED Provider Notes (Signed)
Medical screening examination/treatment/procedure(s) were performed by non-physician practitioner and as supervising physician I was immediately available for consultation/collaboration.  Antonio Woodhams K Yarelly Kuba-Rasch, MD 03/31/12 0736 

## 2012-03-31 NOTE — ED Notes (Signed)
Patient was told he was being discharged; Ebbie Ridge and Dr. Nicanor Alcon at bedside; patient upset with his prescription and it was explained to him that he was to follow up with his orthopedic MD for further management of his pain. Patient became angry and stormed out of the room cursing.

## 2012-03-31 NOTE — ED Notes (Signed)
Patient states he fell last evening.  Now having pain when he walks.  No swelling or deformity seen.

## 2012-03-31 NOTE — ED Provider Notes (Addendum)
History     CSN: [id number]  Arrival date & time 03/31/12  0506   First MD Initiated Contact with Patient 03/31/12 [phone]      Chief Complaint  Patient presents with  . Knee Pain    (Consider location/radiation/quality/duration/timing/severity/associated sxs/prior treatment) HPI The patient presents to the ER following a fall last night. The patient states that he is having R knee pain. The patient states that he fell 2 more times after the initial fall. The patient states that his knee has not been right since he had tibial plateau surgery. The patient states that he took ibuprofen and goodys for the pain. The patient states that these did not help. The patient states that his knees have been hurting for a while.  Past Medical History  Diagnosis Date  . Depression   . GERD (gastroesophageal reflux disease)   . Migraine   . Ulcer     hx of stomach ulcer  . 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  [name] History of meningitis 2000    Past Surgical History  Procedure Date  . Brain surgery 2000    Removed growth?  . Orif tibia plateau 12/13/2011    Procedure: OPEN REDUCTION INTERNAL FIXATION (ORIF) TIBIAL PLATEAU;  Surgeon: Shelda Pal;  Location: [facility];  Service: Orthopedics;  Laterality: Right;    Family History  Problem Relation Age of Onset  . Arthritis Mother   . COPD Mother   . Emphysema Mother   . Heart disease Mother   . Depression Father   . Diabetes Maternal Aunt   . Diabetes Maternal Uncle     History  Substance Use Topics  . Smoking status: Current Everyday Smoker -- 1.5 packs/day    Types: Cigarettes    Last Attempt to Quit: 05/31/2011  . Smokeless tobacco: Never Used  . Alcohol Use: Yes      Review of Systems All pertinent positives and negatives reviewed in the history of present illness  Allergies  Paxil; Phenobarbital; Phenytoin; Tegretol; and Topamax  Home Medications   Current Outpatient Rx  Name Route  Sig Dispense Refill  . AMPHETAMINE-DEXTROAMPHETAMINE 30 MG PO TABS Oral Take 30 mg by mouth daily.    Marlin Canary HEADACHE PO Oral Take 1 packet by mouth daily as needed.    [name] CLONAZEPAM 2 MG PO TABS Oral Take 2 mg by mouth 4 (four) times daily.     . DULOXETINE HCL 60 MG PO CPEP Oral Take 1 capsule (60 mg total) by mouth daily. 30 capsule 2  . HYDROMORPHONE HCL 2 MG PO TABS Oral Take 1 mg by mouth every 4 (four) hours as needed. For pain    . IBUPROFEN 800 MG PO TABS Oral Take 1 tablet (800 mg total) by mouth 3 (three) times daily. 21 tablet 0  . LEVETIRACETAM 500 MG PO TABS Oral Take 1 tablet (500 mg total) by mouth every 12 (twelve) hours. 30 tablet 0  . ADULT MULTIVITAMIN W/MINERALS CH Oral Take 1 tablet by mouth daily.     . OXYCODONE-ACETAMINOPHEN 10-325 MG PO TABS Oral Take 1 tablet by mouth every 4 (four) hours as needed. For pain    . PREGABALIN 100 MG PO CAPS Oral Take 100 mg by mouth 2 (two) times daily.    [name] RANITIDINE HCL 150 MG PO TABS Oral Take 300 mg by mouth 3 (three) times daily as needed. For heartburn    . TRAMADOL HCL 50  MG PO TABS Oral Take 1 tablet (50 mg total) by mouth every 6 (six) hours as needed for pain. 6 tablet 0    BP 114/99  Pulse 92  Temp(Src) 97.8 F (36.6 C) (Oral)  Resp 20  SpO2 98%  Physical Exam  Constitutional: He appears well-developed and well-nourished.  HENT:  Head: Normocephalic and atraumatic.  Eyes: Pupils are equal, round, and reactive to light.  Cardiovascular: Normal rate and regular rhythm.  Exam reveals no gallop and no friction rub.   No murmur heard. Pulmonary/Chest: Effort normal and breath sounds normal. No respiratory distress.  Musculoskeletal:       Right knee: He exhibits normal range of motion, no swelling, no effusion, no ecchymosis and no deformity.       Legs: Skin: Skin is warm and dry. No rash noted.    ED Course  Procedures (including critical care time)  The patient has an up coming ortho appointment. I advised him  to return here as needed. Ice and elevate the knee. The patient has no interval change in the knee from previous films on 4/13.   The patient is angry that we will not give him Vicodin and Klonopin. I advised him that we will give him Mobic. The patient states that the anti inflammatory will not help. He was bartering with [place] to give him a limited quantity.  MDM  MDM Reviewed: nursing note, vitals and previous chart Interpretation: x-ray           Carlyle Dolly, PA-C 03/31/12 0715  Carlyle Dolly, PA-C 03/31/12 0715  Carlyle Dolly, PA-C 03/31/12 0740

## 2012-04-13 ENCOUNTER — Encounter (HOSPITAL_COMMUNITY): Payer: Self-pay | Admitting: Emergency Medicine

## 2012-04-13 ENCOUNTER — Emergency Department (HOSPITAL_COMMUNITY)
Admission: EM | Admit: 2012-04-13 | Discharge: 2012-04-13 | Discharge status: Against Medical Advice | Payer: Medicare Other | Attending: Emergency Medicine | Admitting: Emergency Medicine

## 2012-04-13 DIAGNOSIS — F410 Panic disorder [episodic paroxysmal anxiety] without agoraphobia: Secondary | ICD-10-CM | POA: Insufficient documentation

## 2012-04-13 DIAGNOSIS — R569 Unspecified convulsions: Secondary | ICD-10-CM | POA: Insufficient documentation

## 2012-04-13 NOTE — ED Notes (Signed)
Hit his rt knee also

## 2012-04-13 NOTE — ED Notes (Signed)
Pt states thathe had a panic attack has run out of klonipin may have had a sz

## 2012-04-23 ENCOUNTER — Encounter (HOSPITAL_COMMUNITY): Payer: Self-pay | Admitting: Nurse Practitioner

## 2012-04-23 ENCOUNTER — Emergency Department (HOSPITAL_COMMUNITY): Payer: Medicare Other

## 2012-04-23 ENCOUNTER — Emergency Department (HOSPITAL_COMMUNITY)
Admission: EM | Admit: 2012-04-23 | Discharge: 2012-04-23 | Disposition: A | Discharge status: Home / Self Care | Payer: Medicare Other | Attending: Emergency Medicine | Admitting: Emergency Medicine

## 2012-04-23 DIAGNOSIS — M25559 Pain in unspecified hip: Secondary | ICD-10-CM | POA: Insufficient documentation

## 2012-04-23 DIAGNOSIS — M25569 Pain in unspecified knee: Secondary | ICD-10-CM | POA: Insufficient documentation

## 2012-04-23 DIAGNOSIS — M549 Dorsalgia, unspecified: Secondary | ICD-10-CM | POA: Insufficient documentation

## 2012-04-23 DIAGNOSIS — W108XXA Fall (on) (from) other stairs and steps, initial encounter: Secondary | ICD-10-CM | POA: Insufficient documentation

## 2012-04-23 DIAGNOSIS — M542 Cervicalgia: Secondary | ICD-10-CM | POA: Insufficient documentation

## 2012-04-23 DIAGNOSIS — R404 Transient alteration of awareness: Secondary | ICD-10-CM | POA: Insufficient documentation

## 2012-04-23 DIAGNOSIS — R51 Headache: Secondary | ICD-10-CM | POA: Insufficient documentation

## 2012-04-23 DIAGNOSIS — IMO0002 Reserved for concepts with insufficient information to code with codable children: Secondary | ICD-10-CM | POA: Insufficient documentation

## 2012-04-23 MED ORDER — HYDROCODONE-ACETAMINOPHEN 5-325 MG PO TABS: 1.0000 | ORAL_TABLET | ORAL | Status: AC | PRN

## 2012-04-23 MED ORDER — MORPHINE SULFATE 4 MG/ML IJ SOLN
4.0000 mg | INTRAMUSCULAR | Status: AC
  Administered 2012-04-23: 4 mg via INTRAVENOUS
  Filled 2012-04-23: qty 1

## 2012-04-23 NOTE — ED Notes (Signed)
Patient transported to CT 

## 2012-04-23 NOTE — ED Provider Notes (Signed)
History     CSN: [id number]  Arrival date & time 04/23/12  1540   None     Chief Complaint  Patient presents with  . Fall    (Consider location/radiation/quality/duration/timing/severity/associated sxs/prior treatment) Patient is a 41 y.o. male presenting with fall. The history is provided by the patient.  Fall The accident occurred less than 1 hour ago. The fall occurred while walking. Distance fallen: 8 brick stairs. He landed on concrete. There was no blood loss. Point of impact: pt unsure, did hit head and right leg. The pain is present in the head and right knee. The pain is mild. He was not ambulatory at the scene. There was no entrapment after the fall. There was no alcohol use involved in the accident. Associated symptoms include loss of consciousness. Pertinent negatives include no fever, no numbness, no abdominal pain, no nausea, no vomiting, no hematuria and no headaches. Exacerbated by: nothing. Treatment on scene includes a c-collar and a backboard. He has tried nothing for the symptoms. The treatment provided no relief.    Past Medical History  Diagnosis Date  . Depression   . GERD (gastroesophageal reflux disease)   . Migraine   . Ulcer     hx of stomach ulcer  . 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  [name] History of meningitis 2000    Past Surgical History  Procedure Date  . Brain surgery 2000    Removed growth?  . Orif tibia plateau 12/13/2011    Procedure: OPEN REDUCTION INTERNAL FIXATION (ORIF) TIBIAL PLATEAU;  Surgeon: Shelda Pal;  Location: [facility];  Service: Orthopedics;  Laterality: Right;    Family History  Problem Relation Age of Onset  . Arthritis Mother   . COPD Mother   . Emphysema Mother   . Heart disease Mother   . Depression Father   . Diabetes Maternal Aunt   . Diabetes Maternal Uncle     History  Substance Use Topics  . Smoking status: Current Everyday Smoker -- 1.5 packs/day    Types: Cigarettes     Last Attempt to Quit: 05/31/2011  . Smokeless tobacco: Never Used  . Alcohol Use: Yes      Review of Systems  Constitutional: Negative for fever.  HENT: Negative for rhinorrhea, drooling and neck pain.   Eyes: Negative for pain.  Respiratory: Negative for cough and shortness of breath.   Cardiovascular: Negative for chest pain and leg swelling.  Gastrointestinal: Negative for nausea, vomiting, abdominal pain and diarrhea.  Genitourinary: Negative for dysuria and hematuria.  Musculoskeletal: Negative for gait problem.  Skin: Negative for color change.  Neurological: Positive for loss of consciousness. Negative for numbness and headaches.  Hematological: Negative for adenopathy.  Psychiatric/Behavioral: Negative for behavioral problems.  All other systems reviewed and are negative.    Allergies  Paroxetine hcl; Phenobarbital; Phenytoin; Tegretol; and Topamax  Home Medications   Current Outpatient Rx  Name Route Sig Dispense Refill  . AMPHETAMINE-DEXTROAMPHETAMINE 30 MG PO TABS Oral Take 30 mg by mouth daily.    [name] CLONAZEPAM 2 MG PO TABS Oral Take 2 mg by mouth 4 (four) times daily.     . DULOXETINE HCL 60 MG PO CPEP Oral Take 1 capsule (60 mg total) by mouth daily. 30 capsule 2  . IBUPROFEN 800 MG PO TABS Oral Take 800 mg by mouth every 8 (eight) hours as needed. For pain    . IRON CR PO Oral Take 1  tablet by mouth daily.    [name] LEVETIRACETAM 500 MG PO TABS Oral Take 1 tablet (500 mg total) by mouth every 12 (twelve) hours. 30 tablet 0  . ADULT MULTIVITAMIN W/MINERALS CH Oral Take 1 tablet by mouth daily.     [name] HAIR/SKIN/NAILS PO Oral Take 1 tablet by mouth daily.    [name] RANITIDINE HCL 150 MG PO TABS Oral Take 300 mg by mouth 3 (three) times daily as needed. For heartburn    . HYDROCODONE-ACETAMINOPHEN 5-325 MG PO TABS Oral Take 1 tablet by mouth every 4 (four) hours as needed for pain. 10 tablet 0    BP 99/62  Pulse 79  Temp(Src) 98.3 F (36.8 C) (Oral)  Resp 18  SpO2  100%  Physical Exam  Constitutional: He is oriented to person, place, and time. He appears well-developed and well-nourished.  HENT:  Head: Normocephalic and atraumatic.  Right Ear: External ear normal.  Left Ear: External ear normal.  Nose: Nose normal.  Mouth/Throat: Oropharynx is clear and moist. No oropharyngeal exudate.       Small abrasion to right lower lip.  Eyes: Conjunctivae and EOM are normal. Pupils are equal, round, and reactive to light.  Neck: Normal range of motion. Neck supple.       Mild lower c-spine ttp.  Cardiovascular: Normal rate, regular rhythm, normal heart sounds and intact distal pulses.  Exam reveals no gallop and no friction rub.   No murmur heard. Pulmonary/Chest: Effort normal and breath sounds normal. No respiratory distress. He has no wheezes.  Abdominal: Soft. Bowel sounds are normal. He exhibits no distension. There is no tenderness.  Musculoskeletal: Normal range of motion. He exhibits no edema and no tenderness.       Mild ttp of right anterior knee and right lateral thigh/hip.  Neurological: He is alert and oriented to person, place, and time.  Skin: Skin is warm and dry.  Psychiatric: He has a normal mood and affect. His behavior is normal.    ED Course  Procedures (including critical care time)  Labs Reviewed - No data to display Dg Cervical Spine Complete  04/23/2012  *RADIOLOGY REPORT*  Clinical Data: Fall.  Neck pain.  CERVICAL SPINE - COMPLETE 4+ VIEW  Comparison: None.  Findings: Straightening of the normal cervical lordosis on the lateral view.  Prevertebral soft tissues are within normal limits.  There is no cervical spine fracture visualized.  Mild C5-C6 degenerative disc disease.  T1 is not visualized.  Cervical spine is visualized to the inferior aspect of C7.  Craniocervical alignment is normal.  Atlantodental junction appears within normal limits.  On the frontal view, there is a levoconvex curvature which may be positional or  associated with spasm.  IMPRESSION:  1.  No cervical spine fracture or dislocation identified. 2.  T1 not visualized. If there is high clinical suspicion of fracture of the cervicothoracic junction, CT should be obtained. 3.  Levoconvex curvature which may be positional or secondary to spasm.  Original Report Authenticated By: Andreas Newport, M.D.   Dg Lumbar Spine Complete  04/23/2012  *RADIOLOGY REPORT*  Clinical Data: Fall.  Right hip and back pain.  LUMBAR SPINE - COMPLETE 4+ VIEW  Comparison: None.  Findings: Anatomic alignment.  Mild degenerative disease at L3-L4 with marginal osteophytes.  Vertebral body height preserved. Intervertebral disc spaces appear normal.  Lumbosacral junction normal.  Tiny amount of abdominal aortic atherosclerosis.  IMPRESSION: Negative lumbar spine radiographs.  Original Report Authenticated By: Andreas Newport, M.D.  Dg Pelvis 1-2 Views  04/23/2012  *RADIOLOGY REPORT*  Clinical Data: Fall  PELVIS - 1-2 VIEW  Comparison: 06/10/2011  Findings: Single frontal view of the pelvis submitted.  No acute fracture or subluxation.  No radiopaque foreign body.  IMPRESSION: No acute fracture or subluxation.  Original Report Authenticated By: Natasha Mead, M.D.   Dg Femur Right  04/23/2012  *RADIOLOGY REPORT*  Clinical Data: Pain post fall  RIGHT FEMUR - 2 VIEW  Comparison: Right knee 03/31/2012  Findings: Four views of the right femur submitted.  No acute fracture or subluxation. Stable postsurgical changes proximal tibia.  IMPRESSION: No acute fracture or subluxation.  Stable postsurgical changes proximal right tibia.  Original Report Authenticated By: Natasha Mead, M.D.   Ct Head Wo Contrast  04/23/2012  *RADIOLOGY REPORT*  Clinical Data: Fall  CT HEAD WITHOUT CONTRAST  Technique:  Contiguous axial images were obtained from the base of the skull through the vertex without contrast.  Comparison: 07/04/2011  Findings: Motion artifacts are noted.  Again noted status post prior craniotomy  right temporal lobe.  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  No skull fracture is noted.  Paranasal sinuses are unremarkable. Hypoplastic right mastoid air cells again noted.  No intracranial hemorrhage, mass effect or midline shift.  No acute infarction.  No mass lesion is noted on this unenhanced scan.  IMPRESSION: No acute intracranial abnormality.  Stable encephalomalacia and prior craniotomy in the right temporal lobe.  Original Report Authenticated By: Natasha Mead, M.D.   Dg Knee Complete 4 Views Right  04/23/2012  *RADIOLOGY REPORT*  Clinical Data: Fall  RIGHT KNEE - COMPLETE 4+ VIEW  Comparison: Right knee 03/31/2012 and 03/25/2012  Findings: Four views of the right knee submitted.  Again noted post surgical changes right proximal tibia.  Again noted nonhealed fracture medial aspect of the tibial plateau.  There is no significant change in alignment from prior exam.  No new of fracture or subluxation.  Fixation material is intact.  IMPRESSION:  Again noted post surgical changes right proximal tibia.  Again noted nonhealed fracture medial aspect of the tibial plateau. There is no significant change in alignment from prior exam.  No new of fracture or subluxation.  Fixation material is intact.  Original Report Authenticated By: Natasha Mead, M.D.     1. Fall down stairs       MDM  1:04 AM 41 y.o. male pw fall down approx 8 brick stairs pta. Pt tripped over his dogs, had positive loc. Currently c/o 6/10 headache and right knee pain. Pt AFVSS, mentating well, will get CT head, plain films, pain control.    1:04 AM: Imaging non-contrib, pain controlled, cleared c-spine. I have discussed the diagnosis/risks/treatment options with the patient and believe the pt to be eligible for discharge home to follow-up with pcp as needed. We also discussed returning to the ED immediately if new or worsening sx occur. We discussed the sx which are most concerning (e.g., worsening pain or HA) that  necessitate immediate return. Any new prescriptions provided to the patient are listed below.  New Prescriptions   HYDROCODONE-ACETAMINOPHEN (NORCO) 5-325 MG PER TABLET    Take 1 tablet by mouth every 4 (four) hours as needed for pain.     Clinical Impression 1. Fall down stairs         Purvis Sheffield, MD 04/24/12 0107

## 2012-04-23 NOTE — Progress Notes (Signed)
Orthopedic Tech Progress Note Patient Details:  [name] Mcdonald Nov 08, 1971 [id number]  Other Ortho Devices Type of Ortho Device: Crutches Ortho Device Interventions: Application   Nikki Dom 04/23/2012, 7:32 PM
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
## 2012-04-23 NOTE — ED Notes (Signed)
Urine specimen collected and at bedside if needed. 

## 2012-04-23 NOTE — ED Notes (Signed)
Pr requested and was provided a urinal

## 2012-04-23 NOTE — ED Notes (Signed)
Pt requested and was provided sandwich and sprite.

## 2012-04-23 NOTE — ED Notes (Signed)
Per ems: pt reports he tripped over his dog walking down steps and may have blacked out. Woke up and called ems. C/o R knee pain, back pain. MAE, A&Ox4, VSS

## 2012-04-23 NOTE — ED Provider Notes (Signed)
41 year old male tripped and fell hitting his head and with loss of consciousness. He is also complaining of pain in his knees. He is neurologically intact nail and no evidence of significant trauma seen on exam. X-rays are unremarkable. He'll be treated symptomatically.  Dione Booze, MD 04/23/12 [phone]

## 2012-04-23 NOTE — ED Notes (Addendum)
Ice pack placed to pts rt.hip.

## 2012-04-23 NOTE — ED Notes (Signed)
Ortho paged for crutches 

## 2012-04-26 NOTE — ED Provider Notes (Signed)
I saw and evaluated the patient, reviewed the resident's note and I agree with the findings and plan.   Dione Booze, MD 04/26/12 [phone]

## 2012-08-13 ENCOUNTER — Emergency Department (HOSPITAL_COMMUNITY)
Admission: EM | Admit: 2012-08-13 | Discharge: 2012-08-13 | Disposition: A | Discharge status: Home Health | Payer: Medicare Other | Attending: Emergency Medicine | Admitting: Emergency Medicine

## 2012-08-13 ENCOUNTER — Encounter (HOSPITAL_COMMUNITY): Payer: Self-pay

## 2012-08-13 DIAGNOSIS — Z87891 Personal history of nicotine dependence: Secondary | ICD-10-CM | POA: Insufficient documentation

## 2012-08-13 DIAGNOSIS — F3289 Other specified depressive episodes: Secondary | ICD-10-CM | POA: Insufficient documentation

## 2012-08-13 DIAGNOSIS — R569 Unspecified convulsions: Secondary | ICD-10-CM | POA: Insufficient documentation

## 2012-08-13 DIAGNOSIS — K219 Gastro-esophageal reflux disease without esophagitis: Secondary | ICD-10-CM | POA: Insufficient documentation

## 2012-08-13 DIAGNOSIS — F329 Major depressive disorder, single episode, unspecified: Secondary | ICD-10-CM | POA: Insufficient documentation

## 2012-08-13 LAB — GLUCOSE, CAPILLARY: Glucose-Capillary: 105 mg/dL — ABNORMAL HIGH (ref 70–99)

## 2012-08-13 MED ORDER — LEVETIRACETAM 500 MG PO TABS
500.0000 mg | ORAL_TABLET | Freq: Once | ORAL | Status: AC
  Administered 2012-08-13: 500 mg via ORAL
  Filled 2012-08-13: qty 1

## 2012-08-13 MED ORDER — LORAZEPAM 1 MG PO TABS
1.0000 mg | ORAL_TABLET | Freq: Once | ORAL | Status: AC
  Administered 2012-08-13: 1 mg via ORAL
  Filled 2012-08-13: qty 1

## 2012-08-13 NOTE — ED Notes (Signed)
Blood glucose at 105

## 2012-08-13 NOTE — ED Provider Notes (Signed)
History     CSN: [id number]  Arrival date & time 08/13/12  1428   First MD Initiated Contact with Patient 08/13/12 1500      Chief Complaint  Patient presents with  . Seizures     Patient is a 41 y.o. male presenting with seizures. The history is provided by the patient and a friend.  Seizures  This is a recurrent problem. The current episode started less than 1 hour ago. The problem has been gradually improving. There was 1 seizure. The most recent episode lasted more than 5 minutes. Pertinent negatives include no neck stiffness, no chest pain and no vomiting. Characteristics include loss of consciousness. The episode was witnessed. The seizures did not continue in the ED. The seizure(s) had no focality. There has been no fever.  pt presents for seizure Friend reports he was standing, and began to have seizure and fell back on bed.  No trauma reported.  She reports it lasted 20 minutes and stopped spontaneously.  He was drowsy after seizure.  He reports med compliance.  There is no drug use reported.  His last SZ was 3 weeks ago.  He denies any recent illness and no recent fever reported.  Past Medical History  Diagnosis Date  . Depression   . GERD (gastroesophageal reflux disease)   . Migraine   . Ulcer     hx of stomach ulcer  . 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  [name] History of meningitis 2000    Past Surgical History  Procedure Date  . Brain surgery 2000    Removed growth?  . Orif tibia plateau 12/13/2011    Procedure: OPEN REDUCTION INTERNAL FIXATION (ORIF) TIBIAL PLATEAU;  Surgeon: Shelda Pal;  Location: [facility];  Service: Orthopedics;  Laterality: Right;    Family History  Problem Relation Age of Onset  . Arthritis Mother   . COPD Mother   . Emphysema Mother   . Heart disease Mother   . Depression Father   . Diabetes Maternal Aunt   . Diabetes Maternal Uncle     History  Substance Use Topics  . Smoking status: Former  Smoker -- 1.5 packs/day    Types: Cigarettes    Quit date: 05/31/2011  . Smokeless tobacco: Never Used  . Alcohol Use: No      Review of Systems  Constitutional: Negative for fever.  Respiratory: Negative for shortness of breath.   Cardiovascular: Negative for chest pain.  Gastrointestinal: Negative for vomiting.  Neurological: Positive for seizures and loss of consciousness.  All other systems reviewed and are negative.    Allergies  Paroxetine hcl; Phenobarbital; Phenytoin; Tegretol; and Topamax  Home Medications   Current Outpatient Rx  Name Route Sig Dispense Refill  . AMPHETAMINE-DEXTROAMPHETAMINE 30 MG PO TABS Oral Take 30 mg by mouth daily.    [name] CLONAZEPAM 2 MG PO TABS Oral Take 2 mg by mouth 4 (four) times daily.     . DULOXETINE HCL 60 MG PO CPEP Oral Take 1 capsule (60 mg total) by mouth daily. 30 capsule 2  . IBUPROFEN 800 MG PO TABS Oral Take 800 mg by mouth every 8 (eight) hours as needed. For pain    . LEVETIRACETAM 500 MG PO TABS Oral Take 1 tablet (500 mg total) by mouth every 12 (twelve) hours. 30 tablet 0  . ADULT MULTIVITAMIN W/MINERALS CH Oral Take 1 tablet by mouth daily.       BP  96/57  Pulse 103  Temp 98.9 F (37.2 C) (Oral)  Resp 20  SpO2 97%  Physical Exam CONSTITUTIONAL: Well developed/well nourished HEAD AND FACE: Normocephalic/atraumatic EYES: EOMI/PERRL ENMT: Mucous membranes moist, No evidence of facial/nasal trauma NECK: supple no meningeal signs SPINE:entire spine nontender, no bruising noted CV: S1/S2 noted, no murmurs/rubs/gallops noted LUNGS: Lungs are clear to auscultation bilaterally, no apparent distress ABDOMEN: soft, nontender, no rebound or guarding GU:no cva tenderness NEURO: Pt is awake/alert, moves all extremitiesx4, no facial droop, no arm or leg drift.   EXTREMITIES: pulses normal, full ROM, no deformity, no bruising noted to extremities.   SKIN: warm, color normal PSYCH: no abnormalities of mood noted  ED Course    Procedures   3:34 PM Will give dose of keppra PO in ED, one dose of ativan, check EKG/CBG and if keeps PO fluids down and if able to ambulate will be safe for d/c home.  Ordered to place in CDU to monitor patient   MDM  Nursing notes including past medical history and social history reviewed and considered in documentation Previous records reviewed and considered    Date: 08/13/2012  Rate: 100  Rhythm: sinus tachycardia  QRS Axis: normal  Intervals: normal  ST/T Wave abnormalities: normal  Conduction Disutrbances:none  Narrative Interpretation:   Old EKG Reviewed: unchanged        Joya Gaskins, MD 08/13/12 1614

## 2012-08-13 NOTE — ED Notes (Signed)
Pt and friend ate 100% of lunch box. Bus pass given to both. Pt states he feels better. Pt alert  At present. Denies pain

## 2012-08-13 NOTE — ED Notes (Signed)
S.o. At bedside.  No seizure activity noted. Pt sleeping. VSS

## 2012-08-13 NOTE — ED Notes (Signed)
Pt sitting up in chair

## 2012-08-13 NOTE — ED Notes (Signed)
Patient was brought in by ambulance with complaint of seizure witnessed by the friend. Patient has a hx of seizure. Patient is lethargic bur is able to arouse. Denies nay fever.

## 2012-08-13 NOTE — ED Notes (Signed)
MD at bedside. 

## 2012-08-13 NOTE — ED Provider Notes (Signed)
3:34 PM Received report from Dr. Bebe Shaggy. Patient accepted to CDU for ED observation. He has a history of seizures and had a witnessed seizure on his bed. Today that resolved on its own. Given 1 mg of Ativan. Plan is to watch. Patient until about 5:30 today. He is able to tolerate by mouth food and ambulate. He may go home with outpatient followup. CV: RRR, No M/R/G, Peripheral pulses intact. No peripheral edema. Lungs: CTAB Abd: Soft, Non tender, non distended The patient is sleepy.   5:11 PM BP 100/57  Pulse 99  Temp 98.9 F (37.2 C) (Oral)  Resp 19  SpO2 92% And is receiving his dose of Keppra. To give him by mouth challenge and have him ambulate seen. And then discharge him for followup with his neurologist. Patient is very sleepy, but he is waking up. He is eating foods. He is able to walk and goes to the bathroom by himself. Patient appears stable. He is somewhat lethargic after, I am enouraging him to get up and move around.     6:00 PM Patient is up.  He has eaten an meal and gotten a bus pass.  He is asking to speak to a psychologist regarding depression. He denies SI/HI/AVH.  I have suggested that he follow up with his PCP regarding psych issues. i have expressed that he may return at any time if he devolops the aforementioned symptoms.  All questions answered fully. Discussed reasons to seek immediate care. Patient expresses understanding and agrees with plan.    Arthor Captain, PA-C 08/14/12 1306

## 2012-08-13 NOTE — ED Notes (Signed)
Pt amb to BR.  Pt and friend eating  Meal box.  Pt alert, states that he missed 2 doses of medicine due to being so busy

## 2012-08-14 NOTE — ED Provider Notes (Signed)
Medical screening examination/treatment/procedure(s) were performed by non-physician practitioner and as supervising physician I was immediately available for consultation/collaboration.   Joya Gaskins, MD 08/14/12 2356

## 2012-08-17 ENCOUNTER — Emergency Department (HOSPITAL_COMMUNITY)
Admission: EM | Admit: 2012-08-17 | Discharge: 2012-08-17 | Disposition: A | Discharge status: Home / Self Care | Payer: Medicare Other | Attending: Emergency Medicine | Admitting: Emergency Medicine

## 2012-08-17 ENCOUNTER — Encounter (HOSPITAL_COMMUNITY): Payer: Self-pay | Admitting: Emergency Medicine

## 2012-08-17 DIAGNOSIS — G8929 Other chronic pain: Secondary | ICD-10-CM | POA: Insufficient documentation

## 2012-08-17 DIAGNOSIS — M79609 Pain in unspecified limb: Secondary | ICD-10-CM | POA: Insufficient documentation

## 2012-08-17 DIAGNOSIS — K219 Gastro-esophageal reflux disease without esophagitis: Secondary | ICD-10-CM | POA: Insufficient documentation

## 2012-08-17 MED ORDER — PREDNISONE 10 MG PO TABS: 20.0000 mg | ORAL_TABLET | Freq: Every day | ORAL | Status: DC

## 2012-08-17 MED ORDER — TRAMADOL HCL 50 MG PO TABS
50.0000 mg | ORAL_TABLET | Freq: Once | ORAL | Status: AC
  Administered 2012-08-17: 50 mg via ORAL
  Filled 2012-08-17: qty 1

## 2012-08-17 MED ORDER — TRAMADOL HCL 50 MG PO TABS: 50.0000 mg | ORAL_TABLET | Freq: Four times a day (QID) | ORAL | Status: AC | PRN

## 2012-08-17 MED ORDER — PREDNISONE 20 MG PO TABS
60.0000 mg | ORAL_TABLET | Freq: Once | ORAL | Status: AC
  Administered 2012-08-17: 60 mg via ORAL
  Filled 2012-08-17: qty 3

## 2012-08-17 NOTE — ED Notes (Signed)
Pt states is having rt knee and left ankle pain sees an ortho dr for this but is out of his pain meds

## 2012-08-17 NOTE — ED Notes (Signed)
Pt got up from waiting room chair ambulated to nurse first desk, reports that he feels dizzy.

## 2012-08-17 NOTE — ED Notes (Signed)
Per EMS: pt sts bilateral leg pain and sts out of valium that he sts takes for seizures; pt seen here multiple times in past for same

## 2012-08-17 NOTE — ED Provider Notes (Signed)
History     CSN: [id number]  Arrival date & time 08/17/12  0908   First MD Initiated Contact with Patient 08/17/12 1103      Chief Complaint  Patient presents with  . Medication Refill  . Leg Pain    (Consider location/radiation/quality/duration/timing/severity/associated sxs/prior treatment) Patient is a 41 y.o. male presenting with leg pain. The history is provided by the patient.  Leg Pain   pt here with chronic at bilateral knees and ankle--also c/o needs for seizure meds( valium)--although he is taking valium--h/o opiarte and benzo abuse--no new injuries or seizuresin the past 24 hours-  Past Medical History  Diagnosis Date  . Depression   . GERD (gastroesophageal reflux disease)   . Migraine   . Ulcer     hx of stomach ulcer  . 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  [name] History of meningitis 2000    Past Surgical History  Procedure Date  . Brain surgery 2000    Removed growth?  . Orif tibia plateau 12/13/2011    Procedure: OPEN REDUCTION INTERNAL FIXATION (ORIF) TIBIAL PLATEAU;  Surgeon: Shelda Pal;  Location: [facility];  Service: Orthopedics;  Laterality: Right;    Family History  Problem Relation Age of Onset  . Arthritis Mother   . COPD Mother   . Emphysema Mother   . Heart disease Mother   . Depression Father   . Diabetes Maternal Aunt   . Diabetes Maternal Uncle     History  Substance Use Topics  . Smoking status: Former Smoker -- 1.5 packs/day    Types: Cigarettes    Quit date: 05/31/2011  . Smokeless tobacco: Never Used  . Alcohol Use: No      Review of Systems  All other systems reviewed and are negative.    Allergies  Paroxetine hcl; Phenobarbital; Phenytoin; Tegretol; and Topamax  Home Medications   Current Outpatient Rx  Name Route Sig Dispense Refill  . AMPHETAMINE-DEXTROAMPHETAMINE 30 MG PO TABS Oral Take 30 mg by mouth daily.    . SUPER B COMPLEX PO Oral Take 1 tablet by mouth daily.       [name] CLONAZEPAM 2 MG PO TABS Oral Take 2 mg by mouth 4 (four) times daily.     . DULOXETINE HCL 60 MG PO CPEP Oral Take 1 capsule (60 mg total) by mouth daily. 30 capsule 2  . LEVETIRACETAM 500 MG PO TABS Oral Take 1 tablet (500 mg total) by mouth every 12 (twelve) hours. 30 tablet 0  . ADULT MULTIVITAMIN W/MINERALS CH Oral Take 1 tablet by mouth daily.     . OXYCODONE-ACETAMINOPHEN 10-325 MG PO TABS Oral Take 1 tablet by mouth every 4 (four) hours as needed. For pain.      BP 91/67  Pulse 79  Temp 97.5 F (36.4 C) (Oral)  Resp 16  SpO2 97%  Physical Exam  Nursing note and vitals reviewed. Constitutional: He is oriented to person, place, and time. He appears well-developed and well-nourished.  Non-toxic appearance. No distress.  HENT:  Head: Normocephalic and atraumatic.  Eyes: Conjunctivae, EOM and lids are normal. Pupils are equal, round, and reactive to light.  Neck: Normal range of motion. Neck supple. No tracheal deviation present. No mass present.  Cardiovascular: Normal rate, regular rhythm and normal heart sounds.  Exam reveals no gallop.   No murmur heard. Pulmonary/Chest: Effort normal and breath sounds normal. No stridor. No respiratory distress. He has no decreased breath  sounds. He has no wheezes. He has no rhonchi. He has no rales.  Abdominal: Soft. Normal appearance and bowel sounds are normal. He exhibits no distension. There is no tenderness. There is no rebound and no CVA tenderness.  Musculoskeletal: Normal range of motion. He exhibits no edema and no tenderness.  Neurological: He is alert and oriented to person, place, and time. He has normal strength. No cranial nerve deficit or sensory deficit. GCS eye subscore is 4. GCS verbal subscore is 5. GCS motor subscore is 6.  Skin: Skin is warm and dry. No abrasion and no rash noted.  Psychiatric: He has a normal mood and affect. His speech is normal and behavior is normal.    ED Course  Procedures (including critical  care time)  Labs Reviewed - No data to display No results found.   No diagnosis found.    MDM  Pt given prednisone and tramadol for his chronic pain --will f/u dr. Blair Promise, MD 08/17/12 [phone]

## 2012-08-19 ENCOUNTER — Emergency Department (HOSPITAL_COMMUNITY)
Admission: EM | Admit: 2012-08-19 | Discharge: 2012-08-19 | Disposition: A | Discharge status: Home / Self Care | Payer: Medicare Other | Attending: Emergency Medicine | Admitting: Emergency Medicine

## 2012-08-19 DIAGNOSIS — F329 Major depressive disorder, single episode, unspecified: Secondary | ICD-10-CM | POA: Insufficient documentation

## 2012-08-19 DIAGNOSIS — F3289 Other specified depressive episodes: Secondary | ICD-10-CM | POA: Insufficient documentation

## 2012-08-19 DIAGNOSIS — M25579 Pain in unspecified ankle and joints of unspecified foot: Secondary | ICD-10-CM | POA: Insufficient documentation

## 2012-08-19 DIAGNOSIS — G8929 Other chronic pain: Secondary | ICD-10-CM | POA: Insufficient documentation

## 2012-08-19 DIAGNOSIS — K219 Gastro-esophageal reflux disease without esophagitis: Secondary | ICD-10-CM | POA: Insufficient documentation

## 2012-08-19 DIAGNOSIS — Z87891 Personal history of nicotine dependence: Secondary | ICD-10-CM | POA: Insufficient documentation

## 2012-08-19 DIAGNOSIS — M25569 Pain in unspecified knee: Secondary | ICD-10-CM | POA: Insufficient documentation

## 2012-08-19 MED ORDER — OXYCODONE-ACETAMINOPHEN 5-325 MG PO TABS
1.0000 | ORAL_TABLET | Freq: Once | ORAL | Status: AC
  Administered 2012-08-19: 1 via ORAL
  Filled 2012-08-19: qty 1

## 2012-08-19 NOTE — ED Notes (Signed)
Pt states, "Man just go ahead and discharge me so I can get some heroin on the street and shoot up to get rid of this pain!...its going to take an hour to see a doctor.Fonnie Jarvis, MD notified

## 2012-08-19 NOTE — ED Notes (Signed)
Bednar, MD at bedside. 

## 2012-08-19 NOTE — ED Notes (Signed)
[id number]<AV> Expected date:<BR> Expected time:<BR> Means of arrival:<BR> Comments:<BR> EMS: asthma

## 2012-08-19 NOTE — ED Notes (Signed)
Pt reports walking his dog with flip flops on and states he tripped causing injury to his right knee and left ankle. Per EMS pt requesting "strong pain medication during transport here" EMS explained to pt they were unable to does so, Pt then complained that all he would get from the hospital is "tylenol" and he no long wanted to come and wanted to go to his primary care physician to get "percocet" per EMS.

## 2012-08-19 NOTE — ED Provider Notes (Signed)
History     CSN: [id number]  Arrival date & time 08/19/12  1318   First MD Initiated Contact with Patient 08/19/12 1345      Chief Complaint  Patient presents with  . Fall  . Right knee pain   . Left ankle pain     (Consider location/radiation/quality/duration/timing/severity/associated sxs/prior treatment) HPI This 41 year old male has chronic severe pain in both legs both knees right knee worse than the left which is prescribed Ultram 2 days ago from the emergency department for his pain and now wants stronger pain medicine or a refill on his Ultram. There is no change from his chronic severe pain. There is no new weakness or numbness to his legs. The patient told EMS he tripped and fell there is no abrasions swelling or bruising to his legs. The patient denies hitting his head denies neck pain back pain chest pain shortness breath abdominal pain. He denies any threats or himself or others. He denies any seizures. His pain has always been severe for months if not years. He states the Ultram is not helped and he wants stronger pain medicine. There is no treatment prior to arrival. Past Medical History  Diagnosis Date  . Depression   . GERD (gastroesophageal reflux disease)   . Migraine   . Ulcer     hx of stomach ulcer  . 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  [name] History of meningitis 2000    Past Surgical History  Procedure Date  . Brain surgery 2000    Removed growth?  . Orif tibia plateau 12/13/2011    Procedure: OPEN REDUCTION INTERNAL FIXATION (ORIF) TIBIAL PLATEAU;  Surgeon: Shelda Pal;  Location: [facility];  Service: Orthopedics;  Laterality: Right;    Family History  Problem Relation Age of Onset  . Arthritis Mother   . COPD Mother   . Emphysema Mother   . Heart disease Mother   . Depression Father   . Diabetes Maternal Aunt   . Diabetes Maternal Uncle     History  Substance Use Topics  . Smoking status: Former Smoker --  1.5 packs/day    Types: Cigarettes    Quit date: 05/31/2011  . Smokeless tobacco: Never Used  . Alcohol Use: No      Review of Systems 10 Systems reviewed and are negative for acute change except as noted in the HPI. Allergies  Paroxetine hcl; Phenobarbital; Phenytoin; Tegretol; and Topamax  Home Medications   Current Outpatient Rx  Name Route Sig Dispense Refill  . AMPHETAMINE-DEXTROAMPHETAMINE 30 MG PO TABS Oral Take 30 mg by mouth daily.    . SUPER B COMPLEX PO Oral Take 1 tablet by mouth daily.     [name] CLONAZEPAM 2 MG PO TABS Oral Take 2 mg by mouth 4 (four) times daily.     . DULOXETINE HCL 60 MG PO CPEP Oral Take 1 capsule (60 mg total) by mouth daily. 30 capsule 2  . LEVETIRACETAM 500 MG PO TABS Oral Take 1 tablet (500 mg total) by mouth every 12 (twelve) hours. 30 tablet 0  . ADULT MULTIVITAMIN W/MINERALS CH Oral Take 1 tablet by mouth daily.     . OXYCODONE-ACETAMINOPHEN 10-325 MG PO TABS Oral Take 1 tablet by mouth every 4 (four) hours as needed. For pain.    [name] PREDNISONE 10 MG PO TABS Oral Take 2 tablets (20 mg total) by mouth daily. 10 tablet 0  . TRAMADOL HCL 50 MG  PO TABS Oral Take 1 tablet (50 mg total) by mouth every 6 (six) hours as needed for pain. 15 tablet 0    BP 102/71  Pulse 86  Temp 98.2 F (36.8 C) (Oral)  Resp 18  Ht 5\' 4"  (1.626 m)  Wt 135 lb (61.236 kg)  BMI 23.17 kg/m2  SpO2 98%  Physical Exam  Nursing note and vitals reviewed. Constitutional:       Awake, alert, nontoxic appearance with baseline speech for patient.  HENT:  Head: Atraumatic.  Mouth/Throat: No oropharyngeal exudate.  Eyes: EOM are normal. Pupils are equal, round, and reactive to light. Right eye exhibits no discharge. Left eye exhibits no discharge.  Neck: Neck supple.  Cardiovascular: Normal rate and regular rhythm.   No murmur heard. Pulmonary/Chest: Effort normal and breath sounds normal. No stridor. No respiratory distress. He has no wheezes. He has no rales. He exhibits  no tenderness.  Abdominal: Soft. Bowel sounds are normal. He exhibits no mass. There is no tenderness. There is no rebound.  Musculoskeletal: He exhibits no tenderness.       Baseline ROM, moves extremities with no obvious new focal weakness. Both knees are tender with good active range of motion.He has flexion past 90 with no erythema or excessive warmth. He walks independently.  Lymphadenopathy:    He has no cervical adenopathy.  Neurological: He is alert.       Awake, alert, cooperative and aware of situation; motor strength bilaterally; sensation normal to light touch bilaterally; peripheral visual fields full to confrontation; no facial asymmetry; tongue midline; major cranial nerves appear intact; no pronator drift, normal finger to nose bilaterally, baseline gait without new ataxia.  Skin: No rash noted.  Psychiatric: He has a normal mood and affect.    ED Course  Procedures (including critical care time) Pt aware he will not be receiving another new prescription since just got one this week. Labs Reviewed - No data to display No results found.   1. Chronic pain       MDM   Pt feels stable after observation and/or treatment in ED.Patient / Family / Caregiver informed of clinical course, understand medical decision-making process, and agree with plan.       Hurman Horn, MD 08/19/12 2119

## 2012-09-08 ENCOUNTER — Emergency Department (HOSPITAL_COMMUNITY)
Admission: EM | Admit: 2012-09-08 | Discharge: 2012-09-09 | Disposition: A | Discharge status: Home / Self Care | Payer: Medicare Other | Attending: Emergency Medicine | Admitting: Emergency Medicine

## 2012-09-08 DIAGNOSIS — R569 Unspecified convulsions: Secondary | ICD-10-CM | POA: Insufficient documentation

## 2012-09-08 NOTE — ED Notes (Signed)
Reports seizure today due to being out of klonopin. Per EMS  No incontinence, no post-ictal behavior, yelling through seizure, swinging legs around in a circular motion and smacking head on ground.

## 2012-09-09 ENCOUNTER — Encounter (HOSPITAL_COMMUNITY): Payer: Self-pay | Admitting: *Deleted

## 2012-09-09 ENCOUNTER — Emergency Department (HOSPITAL_COMMUNITY)
Admission: EM | Admit: 2012-09-09 | Discharge: 2012-09-09 | Disposition: A | Discharge status: Home / Self Care | Payer: Medicare Other | Attending: Emergency Medicine | Admitting: Emergency Medicine

## 2012-09-09 DIAGNOSIS — M79609 Pain in unspecified limb: Secondary | ICD-10-CM | POA: Insufficient documentation

## 2012-09-09 DIAGNOSIS — R569 Unspecified convulsions: Secondary | ICD-10-CM | POA: Insufficient documentation

## 2012-09-09 NOTE — ED Notes (Addendum)
Pt not in triage room.  Did not see pt leave.

## 2012-09-09 NOTE — ED Notes (Signed)
Pt was here in ED and left [facility].  States he had seizure tonight witnessed by mother.  C/o left leg pain, states tibial plateau fx in December.

## 2012-09-09 NOTE — ED Notes (Signed)
Was informed by tech that pt walked out.  Went to see if pt would come back, pt was outside the building and did not turn around when name called.

## 2012-09-09 NOTE — ED Notes (Signed)
pt said I'm leaving the wait is to long

## 2012-09-10 ENCOUNTER — Emergency Department (HOSPITAL_COMMUNITY)
Admission: EM | Admit: 2012-09-10 | Discharge: 2012-09-10 | Disposition: A | Discharge status: Home / Self Care | Payer: Medicare Other | Attending: Emergency Medicine | Admitting: Emergency Medicine

## 2012-09-10 ENCOUNTER — Emergency Department (HOSPITAL_COMMUNITY): Payer: Medicare Other

## 2012-09-10 ENCOUNTER — Encounter (HOSPITAL_COMMUNITY): Payer: Self-pay | Admitting: *Deleted

## 2012-09-10 DIAGNOSIS — F3289 Other specified depressive episodes: Secondary | ICD-10-CM | POA: Insufficient documentation

## 2012-09-10 DIAGNOSIS — Z8249 Family history of ischemic heart disease and other diseases of the circulatory system: Secondary | ICD-10-CM | POA: Insufficient documentation

## 2012-09-10 DIAGNOSIS — K219 Gastro-esophageal reflux disease without esophagitis: Secondary | ICD-10-CM | POA: Insufficient documentation

## 2012-09-10 DIAGNOSIS — Z8261 Family history of arthritis: Secondary | ICD-10-CM | POA: Insufficient documentation

## 2012-09-10 DIAGNOSIS — Z8489 Family history of other specified conditions: Secondary | ICD-10-CM | POA: Insufficient documentation

## 2012-09-10 DIAGNOSIS — Z881 Allergy status to other antibiotic agents status: Secondary | ICD-10-CM | POA: Insufficient documentation

## 2012-09-10 DIAGNOSIS — Z833 Family history of diabetes mellitus: Secondary | ICD-10-CM | POA: Insufficient documentation

## 2012-09-10 DIAGNOSIS — F329 Major depressive disorder, single episode, unspecified: Secondary | ICD-10-CM | POA: Insufficient documentation

## 2012-09-10 DIAGNOSIS — Z888 Allergy status to other drugs, medicaments and biological substances status: Secondary | ICD-10-CM | POA: Insufficient documentation

## 2012-09-10 DIAGNOSIS — M25569 Pain in unspecified knee: Secondary | ICD-10-CM | POA: Insufficient documentation

## 2012-09-10 DIAGNOSIS — Z87891 Personal history of nicotine dependence: Secondary | ICD-10-CM | POA: Insufficient documentation

## 2012-09-10 DIAGNOSIS — Z841 Family history of disorders of kidney and ureter: Secondary | ICD-10-CM | POA: Insufficient documentation

## 2012-09-10 MED ORDER — MELOXICAM 7.5 MG PO TABS: 7.5000 mg | ORAL_TABLET | Freq: Every day | ORAL | Status: DC

## 2012-09-10 MED ORDER — OXYCODONE-ACETAMINOPHEN 5-325 MG PO TABS
1.0000 | ORAL_TABLET | Freq: Once | ORAL | Status: DC
  Administered 2012-09-10: 1 via ORAL
  Filled 2012-09-10: qty 1

## 2012-09-10 MED ORDER — IBUPROFEN 200 MG PO TABS
600.0000 mg | ORAL_TABLET | Freq: Once | ORAL | Status: AC
  Administered 2012-09-10: 600 mg via ORAL
  Filled 2012-09-10: qty 3

## 2012-09-10 NOTE — ED Provider Notes (Signed)
History     CSN: [id number]  Arrival date & time 09/10/12  1305   First MD Initiated Contact with Patient 09/10/12 1344      Chief Complaint  Patient presents with  . Knee Pain    (Consider location/radiation/quality/duration/timing/severity/associated sxs/prior treatment) Patient is a 41 y.o. male presenting with knee pain. The history is provided by the patient. No language interpreter was used.  Knee Pain This is a recurrent problem.   41 year old male here who is well known to the ER for chronic right knee pain. States that he reinjured his knee last night when he had a seizure and fell down the steps. States that he cannot bend the knee and barely walk on it. States that he has no crutches at home because the caretaker history them away. After speaking with or said had who states the patient has gotten at least 09-2014. Of crutches from him patient should have crutches at home. He is also not obtained in many immobilizers and sleeps for his knee from the [facility] cone system which she states that he does not have either. Patient having pain with internal and extra rotation of the right foot pain with palpitation to the right knee. No bruising or deformity or edema noted. Good 2+ pedal pulse  Past Medical History  Diagnosis Date  . Depression   . GERD (gastroesophageal reflux disease)   . Migraine   . Ulcer     hx of stomach ulcer  . 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  [name] History of meningitis 2000    Past Surgical History  Procedure Date  . Brain surgery 2000    Removed growth?  . Orif tibia plateau 12/13/2011    Procedure: OPEN REDUCTION INTERNAL FIXATION (ORIF) TIBIAL PLATEAU;  Surgeon: Shelda Pal;  Location: [facility];  Service: Orthopedics;  Laterality: Right;    Family History  Problem Relation Age of Onset  . Arthritis Mother   . COPD Mother   . Emphysema Mother   . Heart disease Mother   . Depression Father   . Diabetes  Maternal Aunt   . Diabetes Maternal Uncle     History  Substance Use Topics  . Smoking status: Former Smoker -- 1.5 packs/day    Types: Cigarettes    Quit date: 05/31/2011  . Smokeless tobacco: Never Used  . Alcohol Use: No      Review of Systems  Constitutional: Negative.   HENT: Negative.   Eyes: Negative.   Respiratory: Negative.   Cardiovascular: Negative.   Gastrointestinal: Negative.   Musculoskeletal: Positive for gait problem.       R knee pain  Neurological: Negative.   Psychiatric/Behavioral: Negative.   All other systems reviewed and are negative.    Allergies  Paroxetine hcl; Phenobarbital; Phenytoin; Tegretol; Topamax; and Prednisone  Home Medications   Current Outpatient Rx  Name Route Sig Dispense Refill  . AMPHETAMINE-DEXTROAMPHETAMINE 30 MG PO TABS Oral Take 30 mg by mouth daily.    . SUPER B COMPLEX PO Oral Take 1 tablet by mouth daily.     [name] CLONAZEPAM 2 MG PO TABS Oral Take 2 mg by mouth 4 (four) times daily.     . DULOXETINE HCL 60 MG PO CPEP Oral Take 1 capsule (60 mg total) by mouth daily. 30 capsule 2  . ADULT MULTIVITAMIN W/MINERALS CH Oral Take 1 tablet by mouth daily.     . OXYCODONE HCL ER 10 MG  PO TB12 Oral Take 10 mg by mouth every 12 (twelve) hours.      BP 126/70  Pulse 85  Temp 98.1 F (36.7 C) (Oral)  Resp 18  SpO2 100%  Physical Exam  Nursing note and vitals reviewed. Constitutional: He is oriented to person, place, and time. He appears well-developed and well-nourished.  HENT:  Head: Normocephalic.  Eyes: Conjunctivae normal and EOM are normal. Pupils are equal, round, and reactive to light.  Neck: Normal range of motion. Neck supple.  Cardiovascular: Normal rate.   Pulmonary/Chest: Effort normal.  Abdominal: Soft.  Musculoskeletal: He exhibits tenderness. He exhibits no edema.       Pain with palpitation to the right knee internal/external rotation of the right foot, 2+ pedal pulse good sensation and movement below  injury.  Patient ambulating without difficulties in the nurses station to ask for a bus pass  Neurological: He is alert and oriented to person, place, and time.  Skin: Skin is warm and dry.  Psychiatric: He has a normal mood and affect.    ED Course  Procedures (including critical care time)   While examining the patient he is unable to perform range of motion or ambulates without difficulty. With out difficulty to the nurses station he can bend the leg without difficulty by my observation from a far.   Labs Reviewed - No data to display Dg Knee Complete 4 Views Right  09/10/2012  *RADIOLOGY REPORT*  Clinical Data: Knee pain, fall  RIGHT KNEE - COMPLETE 4+ VIEW  Comparison: Prior radiographs 04/23/2012  Findings: Prior ORIF of a healing tibial plateau fracture with medial and lateral buttress plate and screws.  No evidence of immediate hardware complication.  There is persistent lucency along the medial aspect of the tibial plateau which is not significantly changed compared to the prior radiographs.  No neural hardware complication, or change in alignment.  There may be a trace suprapatellar joint effusion.  IMPRESSION:  1.  Postsurgical changes of prior medial and lateral buttress plate screw ORIF of a healing tibial plateau fracture without evidence of new hardware complication or change in alignment. 2.  Trace suprapatellar joint effusion.   Original Report Authenticated By: HEATH      1. Knee pain       MDM  Drug-seeking behavior. Right knee pain on exam. From a far patient can bend and walk and move knee with no difficulty. No narcotics today. I will give him a prescription for Mobic. He can followup with his orthopedic doctor. States that he has a appointment with Eulah Pont in [place] in the near future. He understands he can return for severe pain or any concerns.        Remi Haggard, NP 09/10/12 1453

## 2012-09-10 NOTE — ED Notes (Addendum)
Per EMS. Pt is from home with complaint of right knee pain starting Friday night. Pt reports increased pain with ambulation. Pt has history of previous surgery to right knee. Pt reports injury happened during seizure Friday night. Pt reports taking Excedrin for pain without relief.

## 2012-09-10 NOTE — ED Notes (Signed)
Pt had right knee surgery in past, had seizure yesterday and sts he could have possible injured his right knee from the seizure. Pt can feel the metal in his right knee, limited ROM, pain 10/10

## 2012-09-10 NOTE — ED Provider Notes (Signed)
Medical screening examination/treatment/procedure(s) were performed by non-physician practitioner and as supervising physician I was immediately available for consultation/collaboration.  Tjuana Vickrey, MD 09/10/12 1609 

## 2012-10-05 ENCOUNTER — Encounter (HOSPITAL_COMMUNITY): Payer: Self-pay | Admitting: *Deleted

## 2012-10-05 ENCOUNTER — Emergency Department (HOSPITAL_COMMUNITY)
Admission: EM | Admit: 2012-10-05 | Discharge: 2012-10-05 | Discharge status: Against Medical Advice | Payer: Medicare Other | Attending: Emergency Medicine | Admitting: Emergency Medicine

## 2012-10-05 DIAGNOSIS — S0990XA Unspecified injury of head, initial encounter: Secondary | ICD-10-CM | POA: Insufficient documentation

## 2012-10-05 DIAGNOSIS — Z87891 Personal history of nicotine dependence: Secondary | ICD-10-CM | POA: Insufficient documentation

## 2012-10-05 DIAGNOSIS — Y9289 Other specified places as the place of occurrence of the external cause: Secondary | ICD-10-CM | POA: Insufficient documentation

## 2012-10-05 DIAGNOSIS — F329 Major depressive disorder, single episode, unspecified: Secondary | ICD-10-CM | POA: Insufficient documentation

## 2012-10-05 DIAGNOSIS — Z8719 Personal history of other diseases of the digestive system: Secondary | ICD-10-CM | POA: Insufficient documentation

## 2012-10-05 DIAGNOSIS — W108XXA Fall (on) (from) other stairs and steps, initial encounter: Secondary | ICD-10-CM | POA: Insufficient documentation

## 2012-10-05 DIAGNOSIS — Y9389 Activity, other specified: Secondary | ICD-10-CM | POA: Insufficient documentation

## 2012-10-05 DIAGNOSIS — F3289 Other specified depressive episodes: Secondary | ICD-10-CM | POA: Insufficient documentation

## 2012-10-05 DIAGNOSIS — W19XXXA Unspecified fall, initial encounter: Secondary | ICD-10-CM

## 2012-10-05 DIAGNOSIS — G43909 Migraine, unspecified, not intractable, without status migrainosus: Secondary | ICD-10-CM | POA: Insufficient documentation

## 2012-10-05 MED ORDER — ACETAMINOPHEN 325 MG PO TABS: 650.0000 mg | ORAL_TABLET | Freq: Once | ORAL | Status: DC

## 2012-10-05 NOTE — ED Provider Notes (Signed)
Medical screening examination/treatment/procedure(s) were performed by non-physician practitioner and as supervising physician I was immediately available for consultation/collaboration.  Marwan T Powers, MD 10/05/12 1720 

## 2012-10-05 NOTE — ED Notes (Addendum)
Patient refusing all care.  Said he "wasn't staying if he was not getting pain medications".  States "does not want a head CT, I want pain medication and seizure medication".

## 2012-10-05 NOTE — ED Provider Notes (Signed)
History     CSN: [id number]  Arrival date & time 10/05/12  1117   First MD Initiated Contact with Patient 10/05/12 1128      Chief Complaint  Patient presents with  . Seizures    (Consider location/radiation/quality/duration/timing/severity/associated sxs/prior treatment) HPI Comments: Patient with PMH of seizure disorder, states he is currently taking Klonopin 4 times a day for seizures -- presents with complaint of seizure this morning. Patient states he was feeding his dog outside when he had a seizure. Patient states he fell down approximately 6 cement stairs and thinks that he struck his head. Patient currently complains of severe headache "so bad I can't think straight". Patient states he bit his tongue, "but not hard". Patient denies urinary incontinence. He also complains of neck pain. No nausea, vomiting, diarrhea, abdominal pain. Patient states that he has chronic right knee pain but denies new pain in his extremities. No treatments prior to arrival. Onset acute. Course resolved. Nothing makes symptoms better or worse. Of note, patient has a history of several ED visits and has exhibited narcotic seeking behavior.  The history is provided by the patient and medical records.    Past Medical History  Diagnosis Date  . Depression   . GERD (gastroesophageal reflux disease)   . Migraine   . Ulcer     hx of stomach ulcer  . 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  [name] History of meningitis 2000    Past Surgical History  Procedure Date  . Brain surgery 2000    Removed growth?  . Orif tibia plateau 12/13/2011    Procedure: OPEN REDUCTION INTERNAL FIXATION (ORIF) TIBIAL PLATEAU;  Surgeon: Shelda Pal;  Location: [facility];  Service: Orthopedics;  Laterality: Right;    Family History  Problem Relation Age of Onset  . Arthritis Mother   . COPD Mother   . Emphysema Mother   . Heart disease Mother   . Depression Father   . Diabetes Maternal  Aunt   . Diabetes Maternal Uncle     History  Substance Use Topics  . Smoking status: Former Smoker -- 1.5 packs/day    Types: Cigarettes    Quit date: 05/31/2011  . Smokeless tobacco: Never Used  . Alcohol Use: No      Review of Systems  Constitutional: Negative for fatigue.  HENT: Positive for neck pain. Negative for tinnitus.   Eyes: Negative for photophobia, pain and visual disturbance.  Respiratory: Negative for shortness of breath.   Cardiovascular: Negative for chest pain.  Gastrointestinal: Negative for nausea and vomiting.  Musculoskeletal: Negative for back pain and gait problem.  Skin: Negative for wound.  Neurological: Positive for seizures and headaches. Negative for dizziness, weakness, light-headedness and numbness.  Psychiatric/Behavioral: Negative for confusion and decreased concentration.    Allergies  Paroxetine hcl; Phenobarbital; Phenytoin; Tegretol; Topamax; and Prednisone  Home Medications   Current Outpatient Rx  Name Route Sig Dispense Refill  . AMPHETAMINE-DEXTROAMPHETAMINE 30 MG PO TABS Oral Take 30 mg by mouth daily.    . SUPER B COMPLEX PO Oral Take 1 tablet by mouth daily.     [name] CLONAZEPAM 2 MG PO TABS Oral Take 2 mg by mouth 4 (four) times daily.     . DULOXETINE HCL 60 MG PO CPEP Oral Take 1 capsule (60 mg total) by mouth daily. 30 capsule 2  . MELOXICAM 7.5 MG PO TABS Oral Take 1 tablet (7.5 mg total) by mouth daily.  25 tablet 0  . ADULT MULTIVITAMIN W/MINERALS CH Oral Take 1 tablet by mouth daily.     . OXYCODONE HCL ER 10 MG PO TB12 Oral Take 10 mg by mouth every 12 (twelve) hours.      BP 103/47  Pulse 70  Temp 98.4 F (36.9 C) (Oral)  Resp 20  Ht 5\' 3"  (1.6 m)  Wt 135 lb (61.236 kg)  BMI 23.91 kg/m2  SpO2 99%  Physical Exam  Nursing note and vitals reviewed. Constitutional: He is oriented to person, place, and time. He appears well-developed and well-nourished.  HENT:  Head: Normocephalic and atraumatic.  Right Ear:  Tympanic membrane, external ear and ear canal normal.  Left Ear: Tympanic membrane, external ear and ear canal normal.  Nose: Nose normal.  Mouth/Throat: Uvula is midline, oropharynx is clear and moist and mucous membranes are normal.       No head trauma noted.   Eyes: Conjunctivae normal, EOM and lids are normal. Pupils are equal, round, and reactive to light.  Neck: Normal range of motion. Neck supple.  Cardiovascular: Normal rate and regular rhythm.   Pulmonary/Chest: Effort normal and breath sounds normal.  Abdominal: Soft. There is no tenderness.  Musculoskeletal: Normal range of motion.       Cervical back: He exhibits tenderness. He exhibits normal range of motion and no bony tenderness.       Thoracic back: Normal.       Lumbar back: Normal.  Neurological: He is alert and oriented to person, place, and time. He has normal strength and normal reflexes. No cranial nerve deficit or sensory deficit. He exhibits normal muscle tone. He displays a negative Romberg sign. Coordination and gait normal. GCS eye subscore is 4. GCS verbal subscore is 5. GCS motor subscore is 6.  Skin: Skin is warm and dry.  Psychiatric: He has a normal mood and affect.    ED Course  Procedures (including critical care time)  Labs Reviewed - No data to display No results found.   1. Fall     12:08 PM Patient seen and examined. Work-up initiated. Medications ordered. Patient requesting pain medication and klonopin for seizures. I offered tylenol and state we will not be providing any narcotics. Will evaluate head given complaint of fall down stairs with severe HA.   Vital signs reviewed and are as follows: Filed Vitals:   10/05/12 1142  BP: 103/47  Pulse: 70  Temp: 98.4 F (36.9 C)  Resp: 20    12:20 PM Patient upset that he wasn't going to be given pain medication stronger than Tylenol and seizure medication (klonopin) and left the ED.   MDM  AMA       Renne Crigler, PA 10/05/12 1225

## 2012-10-05 NOTE — ED Notes (Signed)
Patient states he was feeding his cat, blacked out and stumbled down the steps.  He states he is hurting all over.  Patient alert and oriented.  No s/sx of post ictal state

## 2012-10-12 ENCOUNTER — Emergency Department (HOSPITAL_COMMUNITY): Payer: Medicare Other

## 2012-10-12 ENCOUNTER — Encounter (HOSPITAL_COMMUNITY): Payer: Self-pay | Admitting: Emergency Medicine

## 2012-10-12 ENCOUNTER — Emergency Department (HOSPITAL_COMMUNITY)
Admission: EM | Admit: 2012-10-12 | Discharge: 2012-10-12 | Discharge status: Against Medical Advice | Payer: Medicare Other | Attending: Emergency Medicine | Admitting: Emergency Medicine

## 2012-10-12 DIAGNOSIS — G40909 Epilepsy, unspecified, not intractable, without status epilepticus: Secondary | ICD-10-CM | POA: Insufficient documentation

## 2012-10-12 DIAGNOSIS — F1410 Cocaine abuse, uncomplicated: Secondary | ICD-10-CM | POA: Insufficient documentation

## 2012-10-12 DIAGNOSIS — T07XXXA Unspecified multiple injuries, initial encounter: Secondary | ICD-10-CM | POA: Insufficient documentation

## 2012-10-12 DIAGNOSIS — F329 Major depressive disorder, single episode, unspecified: Secondary | ICD-10-CM | POA: Insufficient documentation

## 2012-10-12 DIAGNOSIS — Z87891 Personal history of nicotine dependence: Secondary | ICD-10-CM | POA: Insufficient documentation

## 2012-10-12 DIAGNOSIS — Y9289 Other specified places as the place of occurrence of the external cause: Secondary | ICD-10-CM | POA: Insufficient documentation

## 2012-10-12 DIAGNOSIS — Z79899 Other long term (current) drug therapy: Secondary | ICD-10-CM | POA: Insufficient documentation

## 2012-10-12 DIAGNOSIS — W19XXXA Unspecified fall, initial encounter: Secondary | ICD-10-CM

## 2012-10-12 DIAGNOSIS — F3289 Other specified depressive episodes: Secondary | ICD-10-CM | POA: Insufficient documentation

## 2012-10-12 DIAGNOSIS — W108XXA Fall (on) (from) other stairs and steps, initial encounter: Secondary | ICD-10-CM | POA: Insufficient documentation

## 2012-10-12 DIAGNOSIS — Y9301 Activity, walking, marching and hiking: Secondary | ICD-10-CM | POA: Insufficient documentation

## 2012-10-12 DIAGNOSIS — Z8661 Personal history of infections of the central nervous system: Secondary | ICD-10-CM | POA: Insufficient documentation

## 2012-10-12 DIAGNOSIS — K219 Gastro-esophageal reflux disease without esophagitis: Secondary | ICD-10-CM | POA: Insufficient documentation

## 2012-10-12 LAB — URINALYSIS, ROUTINE W REFLEX MICROSCOPIC
Glucose, UA: NEGATIVE mg/dL
Ketones, ur: NEGATIVE mg/dL
Leukocytes, UA: NEGATIVE
Specific Gravity, Urine: 1.015 (ref 1.005–1.030)
pH: 6.5 (ref 5.0–8.0)

## 2012-10-12 LAB — CBC WITH DIFFERENTIAL/PLATELET
Eosinophils Relative: 5 % (ref 0–5)
HCT: 41.2 % (ref 39.0–52.0)
Lymphocytes Relative: 32 % (ref 12–46)
Lymphs Abs: 3.9 10*3/uL (ref 0.7–4.0)
MCV: 92.6 fL (ref 78.0–100.0)
Monocytes Absolute: 0.9 10*3/uL (ref 0.1–1.0)
Neutro Abs: 6.7 10*3/uL (ref 1.7–7.7)
Platelets: 293 10*3/uL (ref 150–400)
RBC: 4.45 MIL/uL (ref 4.22–5.81)
WBC: 12.1 10*3/uL — ABNORMAL HIGH (ref 4.0–10.5)

## 2012-10-12 LAB — BASIC METABOLIC PANEL
CO2: 24 mEq/L (ref 19–32)
Chloride: 106 mEq/L (ref 96–112)
Potassium: 3.9 mEq/L (ref 3.5–5.1)
Sodium: 140 mEq/L (ref 135–145)

## 2012-10-12 LAB — RAPID URINE DRUG SCREEN, HOSP PERFORMED
Amphetamines: NOT DETECTED
Benzodiazepines: POSITIVE — AB
Cocaine: POSITIVE — AB

## 2012-10-12 MED ORDER — ACETAMINOPHEN 325 MG PO TABS
650.0000 mg | ORAL_TABLET | ORAL | Status: DC | PRN
  Filled 2012-10-12: qty 2

## 2012-10-12 NOTE — ED Notes (Signed)
Patient requesting pain medicine and cursing at the nurse

## 2012-10-12 NOTE — ED Notes (Signed)
[id number]<ON> Expected date:<BR> Expected time:<BR> Means of arrival:<BR> Comments:<BR> Seizure

## 2012-10-12 NOTE — ED Provider Notes (Signed)
History     CSN: [id number]  Arrival date & time 10/12/12  1153   First MD Initiated Contact with Patient 10/12/12 1206      Chief Complaint  Patient presents with  . possible seizure     (Consider location/radiation/quality/duration/timing/severity/associated sxs/prior treatment) HPI Comments: [name] Mcdonald is a 41 y.o. Male who fell, walking down steps, after feeling dizzy. Fall, height less than 1 foot. He injured his head, neck, back, and right leg. He feels like he had a seizure. He takes Klonopin for a seizure disorder. The Klonopin was decreased from 2 mg 4 times a day to 1 mg 4 times a day, one month ago. He does not have a PCP. He gets his Klonopin from an urgent care. He came by EMS, fully immobilized. There is no reported history consistent with seizure, such as generalized tonic, clonic activity, or post state. His mentation was baseline during transport.  The history is provided by the patient.    Past Medical History  Diagnosis Date  . Depression   . GERD (gastroesophageal reflux disease)   . Migraine   . Ulcer     hx of stomach ulcer  . 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  [name] History of meningitis 2000    Past Surgical History  Procedure Date  . Brain surgery 2000    Removed growth?  . Orif tibia plateau 12/13/2011    Procedure: OPEN REDUCTION INTERNAL FIXATION (ORIF) TIBIAL PLATEAU;  Surgeon: Shelda Pal;  Location: [facility];  Service: Orthopedics;  Laterality: Right;    Family History  Problem Relation Age of Onset  . Arthritis Mother   . COPD Mother   . Emphysema Mother   . Heart disease Mother   . Depression Father   . Diabetes Maternal Aunt   . Diabetes Maternal Uncle     History  Substance Use Topics  . Smoking status: Former Smoker -- 1.5 packs/day    Types: Cigarettes    Quit date: 05/31/2011  . Smokeless tobacco: Never Used  . Alcohol Use: No      Review of Systems  All other systems reviewed  and are negative.    Allergies  Bee venom; Paroxetine hcl; Phenobarbital; Phenytoin; Tegretol; Topamax; and Prednisone  Home Medications   Current Outpatient Rx  Name Route Sig Dispense Refill  . AMPHETAMINE-DEXTROAMPHETAMINE 30 MG PO TABS Oral Take 30 mg by mouth every morning.     . SUPER B COMPLEX PO Oral Take 1 tablet by mouth every morning.     [name] CHLORPHENIRAMINE-PHENYLEPHRINE 4-10 MG PO TABS Oral Take 1 tablet by mouth every 4 (four) hours as needed. For sinus irritation    . CLONAZEPAM 1 MG PO TABS Oral Take 1 mg by mouth 4 (four) times daily.    [name] HYDROMORPHONE HCL 4 MG PO TABS Oral Take 4 mg by mouth every 8 (eight) hours as needed. For fibromyalgia and multiple past breaks and fractures    . ADULT MULTIVITAMIN W/MINERALS CH Oral Take 1 tablet by mouth every morning.     . OXYCODONE HCL ER 20 MG PO TB12 Oral Take 20 mg by mouth every 12 (twelve) hours.    [name] RANITIDINE HCL 150 MG PO TABS Oral Take 300 mg by mouth 3 (three) times daily.      BP 99/58  Pulse 70  Temp 97.8 F (36.6 C) (Oral)  Resp 20  SpO2 95%  Physical Exam  Nursing note  and vitals reviewed. Constitutional: He is oriented to person, place, and time. He appears well-developed and well-nourished.  HENT:  Head: Normocephalic and atraumatic.  Right Ear: External ear normal.  Left Ear: External ear normal.       No visible or palpable traumatic injuries to the head, face, or neck. No tongue, deformity or injury  Eyes: Conjunctivae normal and EOM are normal. Pupils are equal, round, and reactive to light.  Neck: Normal range of motion and phonation normal. Neck supple.  Cardiovascular: Normal rate, regular rhythm, normal heart sounds and intact distal pulses.   Pulmonary/Chest: Effort normal and breath sounds normal. He exhibits no bony tenderness.  Abdominal: Soft. Normal appearance. There is no tenderness.  Musculoskeletal: Normal range of motion.       Palpation of the cervical spinal, processes, reveals  tenderness. He is also tender in the low thoracic spine region. No step-off in neck or back spine.  Neurological: He is alert and oriented to person, place, and time. He has normal strength. No cranial nerve deficit or sensory deficit. He exhibits normal muscle tone. Coordination normal.  Skin: Skin is warm, dry and intact.       No bruising, abrasions, or lacerations  Psychiatric: He has a normal mood and affect. His behavior is normal. Judgment and thought content normal.    ED Course  Procedures (including critical care time)  Patient removed from backboard and cervical immobilization, left in place after my exam.  He was offered Tylenol for pain.  He was angry, and left the ED because he wanted Narcotics, and I did not order them.    Labs Reviewed  CBC WITH DIFFERENTIAL - Abnormal; Notable for the following:    WBC 12.1 (*)     All other components within normal limits  URINE RAPID DRUG SCREEN (HOSP PERFORMED) - Abnormal; Notable for the following:    Cocaine POSITIVE (*)     Benzodiazepines POSITIVE (*)     Tetrahydrocannabinol POSITIVE (*)     All other components within normal limits  BASIC METABOLIC PANEL  URINALYSIS, ROUTINE W REFLEX MICROSCOPIC  LAB REPORT - SCANNED   Ct Head Wo Contrast  10/12/2012  *RADIOLOGY REPORT*  Clinical Data: Pain and seizure  CT HEAD WITHOUT CONTRAST  Technique:  Contiguous axial images were obtained from the base of the skull through the vertex without contrast.  Comparison: 10/12/2012  Findings: Craniotomy right temporal region is unchanged.  There is encephalomalacia in the right lateral temporal lobe, unchanged.  No acute infarct or mass.  No acute hemorrhage.  Ventricle size is normal.  No midline shift.  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  No acute abnormality.   Original Report Authenticated By: Janeece Riggers, M.D.    Ct Cervical Spine Wo Contrast  10/12/2012  *RADIOLOGY  REPORT*  Clinical Data: Pain post seizure and trauma  CT CERVICAL SPINE WITHOUT CONTRAST  Technique:  Multidetector CT imaging of the cervical spine was performed. Multiplanar CT image reconstructions were also generated.  Comparison: None.  Findings: There is no fracture or spondylolisthesis.  Prevertebral soft tissues and predental space regions are normal.  Disc spaces appear intact.  No disc extrusion or stenosis.  IMPRESSION: No fracture or spondylolisthesis.   Original Report Authenticated By: Bretta Bang, M.D.      1. Fall   2. Contusion, multiple sites   3. Cocaine abuse       MDM  Fall, secondary to dizziness.  Doubt seizure. Is on a stable dose of benzodiazepine. Patient is typically noncompliant with efforts to help him in the emergency department.He abuses Cocaine. There is no reason to hold him in the ED against his wishes to leave AMA. He has the Capacity to decide to leave.   Plan: D/C AMA, failed to complete the ED evaluation.        Flint Melter, MD 10/13/12 [phone]

## 2012-10-12 NOTE — ED Notes (Signed)
[place] ED [place] [facility]- was in mens bathroom- witness stated saw him shaking, pt walked out of bathroom and down steps--became dizzy, fell off bottom step. Not post ictal, not incontinent,, no oral trauma, Alert and oriented on arrival. Told paramedic that he needs an IV because when he gets to ED, he will get ativan. Also asked for pain medicine.

## 2012-10-12 NOTE — ED Notes (Signed)
GEX:BM84<XL> Expected date:<BR> Expected time:<BR> Means of arrival:<BR> Comments:<BR> seizure

## 2012-10-12 NOTE — ED Notes (Signed)
Patient returned from CT- patient trying to remove C-Collar

## 2013-03-14 ENCOUNTER — Encounter (HOSPITAL_COMMUNITY): Payer: Self-pay | Admitting: *Deleted

## 2013-03-14 ENCOUNTER — Emergency Department (HOSPITAL_COMMUNITY)
Admission: EM | Admit: 2013-03-14 | Discharge: 2013-03-14 | Disposition: A | Discharge status: Home / Self Care | Payer: Medicare Other | Attending: Emergency Medicine | Admitting: Emergency Medicine

## 2013-03-14 ENCOUNTER — Emergency Department (INDEPENDENT_AMBULATORY_CARE_PROVIDER_SITE_OTHER)
Admission: EM | Admit: 2013-03-14 | Discharge: 2013-03-14 | Disposition: A | Discharge status: Home / Self Care | Payer: Medicare Other | Source: Home / Self Care | Attending: Family Medicine | Admitting: Family Medicine

## 2013-03-14 DIAGNOSIS — K4090 Unilateral inguinal hernia, without obstruction or gangrene, not specified as recurrent: Secondary | ICD-10-CM

## 2013-03-14 DIAGNOSIS — F329 Major depressive disorder, single episode, unspecified: Secondary | ICD-10-CM | POA: Insufficient documentation

## 2013-03-14 DIAGNOSIS — S8001XA Contusion of right knee, initial encounter: Secondary | ICD-10-CM

## 2013-03-14 DIAGNOSIS — F3289 Other specified depressive episodes: Secondary | ICD-10-CM | POA: Insufficient documentation

## 2013-03-14 DIAGNOSIS — R1084 Generalized abdominal pain: Secondary | ICD-10-CM | POA: Insufficient documentation

## 2013-03-14 DIAGNOSIS — Z87891 Personal history of nicotine dependence: Secondary | ICD-10-CM | POA: Insufficient documentation

## 2013-03-14 DIAGNOSIS — M79609 Pain in unspecified limb: Secondary | ICD-10-CM | POA: Insufficient documentation

## 2013-03-14 DIAGNOSIS — G40909 Epilepsy, unspecified, not intractable, without status epilepticus: Secondary | ICD-10-CM | POA: Insufficient documentation

## 2013-03-14 DIAGNOSIS — K219 Gastro-esophageal reflux disease without esophagitis: Secondary | ICD-10-CM | POA: Insufficient documentation

## 2013-03-14 DIAGNOSIS — Z8679 Personal history of other diseases of the circulatory system: Secondary | ICD-10-CM | POA: Insufficient documentation

## 2013-03-14 DIAGNOSIS — G8929 Other chronic pain: Secondary | ICD-10-CM | POA: Insufficient documentation

## 2013-03-14 DIAGNOSIS — Z8781 Personal history of (healed) traumatic fracture: Secondary | ICD-10-CM | POA: Insufficient documentation

## 2013-03-14 DIAGNOSIS — Z79899 Other long term (current) drug therapy: Secondary | ICD-10-CM | POA: Insufficient documentation

## 2013-03-14 DIAGNOSIS — S8010XA Contusion of unspecified lower leg, initial encounter: Secondary | ICD-10-CM

## 2013-03-14 DIAGNOSIS — Z8719 Personal history of other diseases of the digestive system: Secondary | ICD-10-CM | POA: Insufficient documentation

## 2013-03-14 DIAGNOSIS — Z8661 Personal history of infections of the central nervous system: Secondary | ICD-10-CM | POA: Insufficient documentation

## 2013-03-14 MED ORDER — TRAMADOL HCL 50 MG PO TABS: 50.0000 mg | ORAL_TABLET | Freq: Four times a day (QID) | ORAL | Status: DC | PRN

## 2013-03-14 MED ORDER — RANITIDINE HCL 150 MG PO TABS: 150.0000 mg | ORAL_TABLET | Freq: Two times a day (BID) | ORAL | Status: DC

## 2013-03-14 MED ORDER — IBUPROFEN 800 MG PO TABS: 800.0000 mg | ORAL_TABLET | Freq: Three times a day (TID) | ORAL | Status: DC

## 2013-03-14 NOTE — ED Notes (Addendum)
Surgery R lower leg 1/1 by Dr. Charlann Boxer for fx. on 12/17.  C/o pain since he ran out of his medicine.  Sees Dr. Lajoyce Corners now.  Abrasions to R knee, both forearms, and forehead from falling out of bed.

## 2013-03-14 NOTE — ED Notes (Signed)
Pt c/o right sided groin pain, sts he has been dx with a hernia and the pain has been worsening. Pt sts he sometimes feels like he can't urinate properly because of the pain along with tightness in his right testicle. Pt reports he had been lifting heavy things for the past couple months and thinks that's what caused it all. Pt also requesting pain medication and anit-anxiety medication and prescriptions. Pt sts he went to jail and was given all these meds on a daily basis but when he was released they couldn't find his prescriptions to give him to take home.

## 2013-03-14 NOTE — ED Provider Notes (Signed)
History     CSN: [id number]  Arrival date & time 03/14/13  1324   First MD Initiated Contact with Patient 03/14/13 1106      Chief Complaint  Patient presents with  . Hernia    (Consider location/radiation/quality/duration/timing/severity/associated sxs/prior treatment) HPI Pt with long history of chronic pain and numerous ED visits reports he just got out of jail 2 days ago. 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 States it pokes out sometimes and then goes back in. Causes diffuse abdominal pain and R leg pain. He also has pain in R knee from remote tibial plateau fracture. He is out of all of his medications and requests refills on them all.   Past Medical History  Diagnosis Date  . Depression   . GERD (gastroesophageal reflux disease)   . Migraine   . Ulcer     hx of stomach ulcer  . 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  [name] History of meningitis 2000    Past Surgical History  Procedure Laterality Date  . Brain surgery  2000    Removed growth?  . Orif tibia plateau  12/13/2011    Procedure: OPEN REDUCTION INTERNAL FIXATION (ORIF) TIBIAL PLATEAU;  Surgeon: Shelda Pal;  Location: [facility];  Service: Orthopedics;  Laterality: Right;    Family History  Problem Relation Age of Onset  . Arthritis Mother   . COPD Mother   . Emphysema Mother   . Heart disease Mother   . Depression Father   . Diabetes Maternal Aunt   . Diabetes Maternal Uncle     History  Substance Use Topics  . Smoking status: Former Smoker -- 1.50 packs/day    Types: Cigarettes    Quit date: 05/31/2011  . Smokeless tobacco: Never Used  . Alcohol Use: No      Review of Systems All other systems reviewed and are negative except as noted in HPI.   Allergies  Bee venom; Paroxetine hcl; Phenobarbital; Phenytoin; Tegretol; Topamax; and Prednisone  Home Medications   Current Outpatient Rx  Name  Route  Sig   Dispense  Refill  . amphetamine-dextroamphetamine (ADDERALL) 30 MG tablet   Oral   Take 30 mg by mouth every morning.          . B Complex-C (SUPER B COMPLEX PO)   Oral   Take 1 tablet by mouth every morning.          . Chlorpheniramine-Phenylephrine 4-10 MG per tablet   Oral   Take 1 tablet by mouth every 4 (four) hours as needed. For sinus irritation         . clonazePAM (KLONOPIN) 1 MG tablet   Oral   Take 1 mg by mouth 4 (four) times daily.         [name] HYDROmorphone (DILAUDID) 4 MG tablet   Oral   Take 4 mg by mouth every 8 (eight) hours as needed. For fibromyalgia and multiple past breaks and fractures         . Multiple Vitamin (MULITIVITAMIN WITH MINERALS) TABS   Oral   Take 1 tablet by mouth every morning.          [name] oxyCODONE (OXYCONTIN) 20 MG 12 hr tablet   Oral   Take 20 mg by mouth every 12 (twelve) hours.         . ranitidine (ZANTAC) 150 MG tablet  Oral   Take 300 mg by mouth 3 (three) times daily.           BP 113/80  Pulse 109  Temp(Src) 97.7 F (36.5 C) (Oral)  Resp 18  SpO2 98%  Physical Exam  Nursing note and vitals reviewed. Constitutional: He is oriented to person, place, and time. He appears well-developed and well-nourished.  HENT:  Head: Normocephalic and atraumatic.  Eyes: EOM are normal. Pupils are equal, round, and reactive to light.  Neck: Normal range of motion. Neck supple.  Cardiovascular: Normal rate, normal heart sounds and intact distal pulses.   Pulmonary/Chest: Effort normal and breath sounds normal.  Abdominal: Bowel sounds are normal. He exhibits no distension. There is no tenderness. There is no rebound and no guarding.  Genitourinary:  No incarcerated inguinal herna  Musculoskeletal: Normal range of motion. He exhibits no edema and no tenderness.  Neurological: He is alert and oriented to person, place, and time. He has normal strength. No cranial nerve deficit or sensory deficit.  Skin: Skin is warm and  dry. No rash noted.  Psychiatric: He has a normal mood and affect.    ED Course  Procedures (including critical care time)  Labs Reviewed - No data to display No results found.   1. Inguinal hernia   2. Chronic pain       MDM  Pt has benign abdomen. No incarcerated hernia. Gen Surg followup.  Advised that I would not prescribe any controlled substance to him, offered 2 days of Tramadol until he can begin the process of establishing with PCP.         Charles B. Bernette Mayers, MD 03/14/13 1151

## 2013-03-14 NOTE — ED Provider Notes (Signed)
History     CSN: [id number]  Arrival date & time 03/14/13  1519   First MD Initiated Contact with Patient 03/14/13 1554      Chief Complaint  Patient presents with  . Leg Pain    (Consider location/radiation/quality/duration/timing/severity/associated sxs/prior treatment) Patient is a 42 y.o. male presenting with leg pain. The history is provided by the patient.  Leg Pain Location:  Knee Injury: yes   Mechanism of injury: fall   Fall:    Impact surface:  Concrete   Point of impact:  Knees   Entrapped after fall: no   Knee location:  R knee Dislocation: no   Foreign body present:  No foreign bodies Associated symptoms comment:  Seen earlier today in ER , given tramadol, pt with new c/o falling and abrasions,desires tramadol but told only ibuprofen will be given.   Past Medical History  Diagnosis Date  . Depression   . GERD (gastroesophageal reflux disease)   . Migraine   . Ulcer     hx of stomach ulcer  . 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  [name] History of meningitis 2000    Past Surgical History  Procedure Laterality Date  . Brain surgery  2000    Removed growth?  . Orif tibia plateau  12/13/2011    Procedure: OPEN REDUCTION INTERNAL FIXATION (ORIF) TIBIAL PLATEAU;  Surgeon: Shelda Pal;  Location: [facility];  Service: Orthopedics;  Laterality: Right;    Family History  Problem Relation Age of Onset  . Arthritis Mother   . COPD Mother   . Emphysema Mother   . Heart disease Mother   . Depression Father   . Diabetes Maternal Aunt   . Diabetes Maternal Uncle     History  Substance Use Topics  . Smoking status: Former Smoker -- 1.50 packs/day    Types: Cigarettes    Quit date: 05/31/2011  . Smokeless tobacco: Never Used  . Alcohol Use: No      Review of Systems  Constitutional: Negative.   Musculoskeletal: Negative for gait problem.  Skin: Positive for wound.    Allergies  Bee venom; Paroxetine hcl;  Phenobarbital; Phenytoin; Tegretol; Topamax; and Prednisone  Home Medications   Current Outpatient Rx  Name  Route  Sig  Dispense  Refill  . amphetamine-dextroamphetamine (ADDERALL) 30 MG tablet   Oral   Take 30 mg by mouth every morning.          . B Complex-C (SUPER B COMPLEX PO)   Oral   Take 1 tablet by mouth every morning.          . clonazePAM (KLONOPIN) 1 MG tablet   Oral   Take 1 mg by mouth 4 (four) times daily.         [name] ibuprofen (ADVIL,MOTRIN) 800 MG tablet   Oral   Take 1 tablet (800 mg total) by mouth 3 (three) times daily.   30 tablet   0   . Multiple Vitamin (MULITIVITAMIN WITH MINERALS) TABS   Oral   Take 1 tablet by mouth every morning.          . ranitidine (ZANTAC) 150 MG tablet   Oral   Take 1 tablet (150 mg total) by mouth 2 (two) times daily.   60 tablet   0   . ranitidine (ZANTAC) 300 MG tablet   Oral   Take 300 mg by mouth 3 (three) times daily.         [name]  traMADol (ULTRAM) 50 MG tablet   Oral   Take 1 tablet (50 mg total) by mouth every 6 (six) hours as needed for pain.   15 tablet   0     BP 119/86  Pulse 106  Temp(Src) 98.5 F (36.9 C) (Oral)  Resp 18  SpO2 95%  Physical Exam  Nursing note and vitals reviewed. Constitutional: He is oriented to person, place, and time. He appears well-developed and well-nourished.  Musculoskeletal: Normal range of motion. He exhibits tenderness.  Neurological: He is alert and oriented to person, place, and time.  Skin: Skin is warm and dry.    ED Course  Procedures (including critical care time)  Labs Reviewed - No data to display No results found.   1. Contusion, knee and lower leg, right, initial encounter       MDM          Linna Hoff, MD 03/14/13 1651

## 2013-03-14 NOTE — ED Notes (Signed)
Pt reports having hernia that is causing right groin pain and also has been out of all his meds since Monday. No acute distress noted at triage.

## 2013-03-15 ENCOUNTER — Encounter (HOSPITAL_COMMUNITY): Payer: Self-pay | Admitting: *Deleted

## 2013-03-15 ENCOUNTER — Other Ambulatory Visit: Payer: Self-pay

## 2013-03-15 ENCOUNTER — Emergency Department (HOSPITAL_COMMUNITY)
Admission: EM | Admit: 2013-03-15 | Discharge: 2013-03-15 | Disposition: A | Discharge status: Home / Self Care | Payer: Medicare Other | Attending: Emergency Medicine | Admitting: Emergency Medicine

## 2013-03-15 DIAGNOSIS — R569 Unspecified convulsions: Secondary | ICD-10-CM

## 2013-03-15 DIAGNOSIS — G40909 Epilepsy, unspecified, not intractable, without status epilepticus: Secondary | ICD-10-CM | POA: Insufficient documentation

## 2013-03-15 DIAGNOSIS — F329 Major depressive disorder, single episode, unspecified: Secondary | ICD-10-CM | POA: Insufficient documentation

## 2013-03-15 DIAGNOSIS — Z8679 Personal history of other diseases of the circulatory system: Secondary | ICD-10-CM | POA: Insufficient documentation

## 2013-03-15 DIAGNOSIS — Z79899 Other long term (current) drug therapy: Secondary | ICD-10-CM | POA: Insufficient documentation

## 2013-03-15 DIAGNOSIS — Z87891 Personal history of nicotine dependence: Secondary | ICD-10-CM | POA: Insufficient documentation

## 2013-03-15 DIAGNOSIS — K219 Gastro-esophageal reflux disease without esophagitis: Secondary | ICD-10-CM | POA: Insufficient documentation

## 2013-03-15 DIAGNOSIS — G8929 Other chronic pain: Secondary | ICD-10-CM | POA: Insufficient documentation

## 2013-03-15 DIAGNOSIS — F3289 Other specified depressive episodes: Secondary | ICD-10-CM | POA: Insufficient documentation

## 2013-03-15 DIAGNOSIS — Z8661 Personal history of infections of the central nervous system: Secondary | ICD-10-CM | POA: Insufficient documentation

## 2013-03-15 DIAGNOSIS — Z8719 Personal history of other diseases of the digestive system: Secondary | ICD-10-CM | POA: Insufficient documentation

## 2013-03-15 LAB — CBC
HCT: 41.5 % (ref 39.0–52.0)
Hemoglobin: 14.4 g/dL (ref 13.0–17.0)
MCHC: 34.7 g/dL (ref 30.0–36.0)
WBC: 8.4 10*3/uL (ref 4.0–10.5)

## 2013-03-15 LAB — GLUCOSE, CAPILLARY: Glucose-Capillary: 91 mg/dL (ref 70–99)

## 2013-03-15 LAB — BASIC METABOLIC PANEL
BUN: 21 mg/dL (ref 6–23)
Chloride: 100 mEq/L (ref 96–112)
GFR calc Af Amer: >90 mL/min (ref >90)
GFR calc non Af Amer: >90 mL/min (ref >90)
Potassium: 3.8 mEq/L (ref 3.5–5.1)
Sodium: 135 mEq/L (ref 135–145)

## 2013-03-15 MED ORDER — IBUPROFEN 800 MG PO TABS
800.0000 mg | ORAL_TABLET | Freq: Once | ORAL | Status: AC
  Administered 2013-03-15: 800 mg via ORAL
  Filled 2013-03-15: qty 1

## 2013-03-15 NOTE — ED Provider Notes (Signed)
History     CSN: [id number]  Arrival date & time 03/15/13  1320   First MD Initiated Contact with Patient 03/15/13 1518      Chief Complaint  Patient presents with  . Seizures    hx of pseudo-seizures    (Consider location/radiation/quality/duration/timing/severity/associated sxs/prior treatment) Patient is a 42 y.o. male presenting with seizures.  Seizures  Pt with numerous ED visits for a variety of complaints mostly chronic pain and reported seizures. I saw him yesterday for complaints of chronic pain, asking for refills of all of his medications. Given Rx for Tramadol and Zantac but no controlled substances. He went to [facility] later that afternoon requesting more pain meds, given Motrin. He reports [place] the courthouse awaiting [place] court time he had a seizure in a bathroom. He reports there was someone else in the bathroom but that person ran off. He is back to baseline now.   Past Medical History  Diagnosis Date  . Depression   . GERD (gastroesophageal reflux disease)   . Migraine   . Ulcer     hx of stomach ulcer  . History of meningitis 2000  . 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    Past Surgical History  Procedure Laterality Date  . Brain surgery  2000    Removed growth?  . Orif tibia plateau  12/13/2011    Procedure: OPEN REDUCTION INTERNAL FIXATION (ORIF) TIBIAL PLATEAU;  Surgeon: Shelda Pal;  Location: [facility];  Service: Orthopedics;  Laterality: Right;  . Mastoidectomy Right 1/1/ 2000    Family History  Problem Relation Age of Onset  . Arthritis Mother   . COPD Mother   . Emphysema Mother   . Heart disease Mother   . Depression Father   . Cirrhosis Father   . Diabetes Maternal Aunt   . Diabetes Maternal Uncle     History  Substance Use Topics  . Smoking status: Former Smoker -- 1.50 packs/day    Types: Cigarettes    Quit date: 05/31/2011  . Smokeless tobacco: Never Used  . Alcohol Use: No       Review of Systems  Neurological: Positive for seizures.   All other systems reviewed and are negative except as noted in HPI.   Allergies  Bee venom; Paroxetine hcl; Phenobarbital; Phenytoin; Tegretol; Topamax; and Prednisone  Home Medications   Current Outpatient Rx  Name  Route  Sig  Dispense  Refill  . amphetamine-dextroamphetamine (ADDERALL) 30 MG tablet   Oral   Take 30 mg by mouth every morning.          . B Complex-C (SUPER B COMPLEX PO)   Oral   Take 1 tablet by mouth every morning.          . clonazePAM (KLONOPIN) 1 MG tablet   Oral   Take 1 mg by mouth 4 (four) times daily.         [name] ibuprofen (ADVIL,MOTRIN) 800 MG tablet   Oral   Take 1 tablet (800 mg total) by mouth 3 (three) times daily.   30 tablet   0   . Multiple Vitamin (MULITIVITAMIN WITH MINERALS) TABS   Oral   Take 1 tablet by mouth every morning.          . ranitidine (ZANTAC) 150 MG tablet   Oral   Take 1 tablet (150 mg total) by mouth 2 (two) times daily.   60 tablet   0   .  ranitidine (ZANTAC) 300 MG tablet   Oral   Take 300 mg by mouth 3 (three) times daily.         . traMADol (ULTRAM) 50 MG tablet   Oral   Take 1 tablet (50 mg total) by mouth every 6 (six) hours as needed for pain.   15 tablet   0     BP 103/75  Pulse 89  Temp(Src) 97.3 F (36.3 C) (Oral)  Resp 20  SpO2 96%  Physical Exam  Nursing note and vitals reviewed. Constitutional: He is oriented to person, place, and time. He appears well-developed and well-nourished.  HENT:  Head: Normocephalic and atraumatic.  Eyes: EOM are normal. Pupils are equal, round, and reactive to light.  Neck: Normal range of motion. Neck supple.  Cardiovascular: Normal rate, normal heart sounds and intact distal pulses.   Pulmonary/Chest: Effort normal and breath sounds normal.  Abdominal: Bowel sounds are normal. He exhibits no distension. There is no tenderness.  Musculoskeletal: Normal range of motion. He exhibits  no edema and no tenderness.  Neurological: He is alert and oriented to person, place, and time. He has normal strength. No cranial nerve deficit or sensory deficit.  Skin: Skin is warm and dry. No rash noted.  Psychiatric: He has a normal mood and affect.    ED Course  Procedures (including critical care time)  Labs Reviewed  CBC - Abnormal; Notable for the following:    Platelets 406 (*)    All other components within normal limits  BASIC METABOLIC PANEL   No results found.   1. Seizure       MDM  Labs ordered in triage unremarkable. No indication for further ED workup. He is requesting soda and Motrin.   Date: 03/15/2013  Rate: 89  Rhythm: normal sinus rhythm  QRS Axis: normal  Intervals: normal  ST/T Wave abnormalities: normal  Conduction Disutrbances: none  Narrative Interpretation: unremarkable            Charles B. Bernette Mayers, MD 03/15/13 1530

## 2013-03-15 NOTE — ED Notes (Signed)
Pt discharged.Vital signs stable and GCS 15 

## 2013-03-15 NOTE — ED Notes (Addendum)
Pt here via EMS for seizure this am.  [place] - [place] a court case this even.  Pt responding to everything at this time. Pt feels weak and disoriented, like he is going to have a seizure (this is his normal aura).  CBG 109.  Pt states there was s/o who saw him fall and hit concrete but he left, so unable to know how long seizure lasted.

## 2013-03-15 NOTE — ED Notes (Signed)
Pt placed in neck brace d/t c/o neck pain after hitting cement during seizure.

## 2013-04-22 ENCOUNTER — Emergency Department (HOSPITAL_COMMUNITY): Payer: Medicare Other

## 2013-04-22 ENCOUNTER — Emergency Department (HOSPITAL_COMMUNITY)
Admission: EM | Admit: 2013-04-22 | Discharge: 2013-04-22 | Disposition: A | Discharge status: Home / Self Care | Payer: Medicare Other | Attending: Emergency Medicine | Admitting: Emergency Medicine

## 2013-04-22 ENCOUNTER — Encounter (HOSPITAL_COMMUNITY): Payer: Self-pay

## 2013-04-22 DIAGNOSIS — Z87891 Personal history of nicotine dependence: Secondary | ICD-10-CM | POA: Insufficient documentation

## 2013-04-22 DIAGNOSIS — K219 Gastro-esophageal reflux disease without esophagitis: Secondary | ICD-10-CM | POA: Insufficient documentation

## 2013-04-22 DIAGNOSIS — Y9301 Activity, walking, marching and hiking: Secondary | ICD-10-CM | POA: Insufficient documentation

## 2013-04-22 DIAGNOSIS — G40909 Epilepsy, unspecified, not intractable, without status epilepticus: Secondary | ICD-10-CM | POA: Insufficient documentation

## 2013-04-22 DIAGNOSIS — Y9289 Other specified places as the place of occurrence of the external cause: Secondary | ICD-10-CM | POA: Insufficient documentation

## 2013-04-22 DIAGNOSIS — R569 Unspecified convulsions: Secondary | ICD-10-CM

## 2013-04-22 DIAGNOSIS — G43909 Migraine, unspecified, not intractable, without status migrainosus: Secondary | ICD-10-CM | POA: Insufficient documentation

## 2013-04-22 DIAGNOSIS — F329 Major depressive disorder, single episode, unspecified: Secondary | ICD-10-CM | POA: Insufficient documentation

## 2013-04-22 DIAGNOSIS — S0990XA Unspecified injury of head, initial encounter: Secondary | ICD-10-CM | POA: Insufficient documentation

## 2013-04-22 DIAGNOSIS — W1809XA Striking against other object with subsequent fall, initial encounter: Secondary | ICD-10-CM | POA: Insufficient documentation

## 2013-04-22 DIAGNOSIS — F3289 Other specified depressive episodes: Secondary | ICD-10-CM | POA: Insufficient documentation

## 2013-04-22 DIAGNOSIS — G8929 Other chronic pain: Secondary | ICD-10-CM | POA: Insufficient documentation

## 2013-04-22 DIAGNOSIS — S199XXA Unspecified injury of neck, initial encounter: Secondary | ICD-10-CM | POA: Insufficient documentation

## 2013-04-22 DIAGNOSIS — S0993XA Unspecified injury of face, initial encounter: Secondary | ICD-10-CM | POA: Insufficient documentation

## 2013-04-22 DIAGNOSIS — Z8711 Personal history of peptic ulcer disease: Secondary | ICD-10-CM | POA: Insufficient documentation

## 2013-04-22 DIAGNOSIS — Z79899 Other long term (current) drug therapy: Secondary | ICD-10-CM | POA: Insufficient documentation

## 2013-04-22 DIAGNOSIS — Z8661 Personal history of infections of the central nervous system: Secondary | ICD-10-CM | POA: Insufficient documentation

## 2013-04-22 LAB — BASIC METABOLIC PANEL
CO2: 26 mEq/L (ref 19–32)
Calcium: 9.8 mg/dL (ref 8.4–10.5)
GFR calc non Af Amer: >90 mL/min (ref >90)
Glucose, Bld: 127 mg/dL — ABNORMAL HIGH (ref 70–99)
Potassium: 3.7 mEq/L (ref 3.5–5.1)
Sodium: 139 mEq/L (ref 135–145)

## 2013-04-22 MED ORDER — IBUPROFEN 800 MG PO TABS
800.0000 mg | ORAL_TABLET | Freq: Once | ORAL | Status: AC
  Administered 2013-04-22: 800 mg via ORAL
  Filled 2013-04-22: qty 1

## 2013-04-22 MED ORDER — CLONAZEPAM 0.5 MG PO TABS: 1.0000 mg | ORAL_TABLET | Freq: Three times a day (TID) | ORAL | Status: DC

## 2013-04-22 MED ORDER — MECLIZINE HCL 25 MG PO TABS: 12.5000 mg | ORAL_TABLET | Freq: Once | ORAL | Status: DC

## 2013-04-22 MED ORDER — CLONAZEPAM 0.5 MG PO TABS
1.0000 mg | ORAL_TABLET | Freq: Once | ORAL | Status: AC
  Administered 2013-04-22: 1 mg via ORAL
  Filled 2013-04-22: qty 2

## 2013-04-22 NOTE — ED Notes (Signed)
Patient transported to X-ray 

## 2013-04-22 NOTE — ED Notes (Signed)
Pt requesting pain medication, Heather PA offered pt Tylenol or Ibuprofen for his headache, pt refused either of them

## 2013-04-22 NOTE — ED Notes (Signed)
Per Radio broadcast assistant, this pt called the ED earlier today with c/o pain and dry pocket d/t having a tooth extracted while he was [facility] jail

## 2013-04-22 NOTE — ED Notes (Signed)
Pt to [facility] ED via [facility].

## 2013-04-22 NOTE — ED Notes (Signed)
Pt c/o back, head, and posterior neck pain d/t falling while walking down the road today with his friend. Pt states "I was just walking and I just fell out, my friend said I had a seizure." Pt states he vaguely remembers the the events following the seizure but not the seizure itself. Pt denies loss of bowel or bladder, or biting his tongue.  Pt states he was just released from [facility] jail and has not taken his medications for seizure or fibromyalgia. They did give him his GERD medication. Pt reports he is out of all of his medications.

## 2013-04-22 NOTE — ED Provider Notes (Signed)
History     CSN: [id number]  Arrival date & time 04/22/13  1617   First MD Initiated Contact with Patient 04/22/13 1617      Chief Complaint  Patient presents with  . Seizures    (Consider location/radiation/quality/duration/timing/severity/associated sxs/prior treatment) HPI Comments: Patient with a history of Seizure Disorder presents today with a chief complaint of seizure.  EMS report that a bystander stated that the seizure lasted approximately 2 minutes.  Seizure described as a generalized tonic-clonic seizure.  Patient reports that he did fall back from a standing position and hit his head on cement when he seized.  He is currently complaining of a headache.  He denies any bowel or bladder incontinence associated with this seizure.  He denies any visual changes.  No fever or chills.  He reports that he is on Klonopin for Seizure Disorder, but has not taken the medication for four days.  He states that he was recently incarcerated and was unable to get the prescription.  He is also complaining of chronic pain of his arms and legs and is requesting narcotic pain mediation.    Patient is a 42 y.o. male presenting with seizures. The history is provided by the patient.  Seizures Seizure activity on arrival: no   Initial focality:  Diffuse Episode characteristics: no incontinence and no tongue biting   Return to baseline: yes   History of seizures: yes     Past Medical History  Diagnosis Date  . Depression   . GERD (gastroesophageal reflux disease)   . Migraine   . Ulcer     hx of stomach ulcer  . History of meningitis 2000  . 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    Past Surgical History  Procedure Laterality Date  . Brain surgery  2000    Removed growth?  . Orif tibia plateau  12/13/2011    Procedure: OPEN REDUCTION INTERNAL FIXATION (ORIF) TIBIAL PLATEAU;  Surgeon: Shelda Pal;  Location: [facility];  Service: Orthopedics;  Laterality:  Right;  . Mastoidectomy Right 1/1/ 2000    Family History  Problem Relation Age of Onset  . Arthritis Mother   . COPD Mother   . Emphysema Mother   . Heart disease Mother   . Depression Father   . Cirrhosis Father   . Diabetes Maternal Aunt   . Diabetes Maternal Uncle     History  Substance Use Topics  . Smoking status: Former Smoker -- 1.50 packs/day    Types: Cigarettes    Quit date: 05/31/2011  . Smokeless tobacco: Never Used  . Alcohol Use: No      Review of Systems  HENT: Positive for neck pain.   Eyes: Negative for visual disturbance.  Genitourinary:       No bowel or bladder incontinence  Neurological: Positive for seizures and headaches.  All other systems reviewed and are negative.    Allergies  Bee venom; Paroxetine hcl; Phenobarbital; Phenytoin; Tegretol; Topamax; and Prednisone  Home Medications   Current Outpatient Rx  Name  Route  Sig  Dispense  Refill  . clonazePAM (KLONOPIN) 2 MG tablet   Oral   Take 2 mg by mouth 3 (three) times daily as needed for anxiety. For anxiety         . Multiple Vitamin (MULITIVITAMIN WITH MINERALS) TABS   Oral   Take 1 tablet by mouth every morning.          [name]  ranitidine (ZANTAC) 300 MG tablet   Oral   Take 150-300 mg by mouth 2 (two) times daily. May take 1 additional dose if needed for heartburn           BP 108/60  Pulse 100  Temp(Src) 98.7 F (37.1 C) (Oral)  Resp 16  SpO2 99%  Physical Exam  Nursing note and vitals reviewed. Constitutional: He appears well-developed and well-nourished.  HENT:  Head: Normocephalic.  Mouth/Throat: Oropharynx is clear and moist.  No obvious trauma to the tongue  Eyes: EOM are normal. Pupils are equal, round, and reactive to light.  Cardiovascular: Normal rate, regular rhythm and normal heart sounds.   Pulmonary/Chest: Effort normal and breath sounds normal.  Musculoskeletal: Normal range of motion.       Cervical back: He exhibits tenderness and bony  tenderness. He exhibits normal range of motion, no swelling, no edema and no deformity.  Neurological: He is alert. He has normal strength. No cranial nerve deficit or sensory deficit.  Skin: Skin is warm and dry.  No bruising or obvious signs of trauma  Psychiatric: He has a normal mood and affect.    ED Course  Procedures (including critical care time)  Labs Reviewed  BASIC METABOLIC PANEL  CBC WITH DIFFERENTIAL   Dg Cervical Spine Complete  04/22/2013  *RADIOLOGY REPORT*  Clinical Data: Seizures, fall.  Neck and back pain.  CERVICAL SPINE - COMPLETE 4+ VIEW  Comparison: None.  Findings: Normal alignment.  Disc spaces are maintained. Prevertebral soft tissues are normal.  No fracture or subluxation. Changes of prior craniotomy noted.  IMPRESSION: No acute bony abnormality.   Original Report Authenticated By: Charlett Nose, M.D.    Ct Head Wo Contrast  04/22/2013  *RADIOLOGY REPORT*  Clinical Data: Headache.  Dizziness.  Seizures.  CT HEAD WITHOUT CONTRAST  Technique:  Contiguous axial images were obtained from the base of the skull through the vertex without contrast.  Comparison:  CT head without contrast 10/12/2012  Findings:  Encephalomalacia of the right temporal lobe is stable. The patient is status post craniotomy.  No acute cortical infarct, hemorrhage, or mass lesion is present.  The ventricles are of normal size.  No significant extra-axial fluid collection is present.  Fluid levels present in the maxillary sinuses bilaterally.  The frontal sinuses are poorly pneumatized.  Postoperative changes are noted in the right mastoid.  IMPRESSION:  1.  No acute intracranial abnormality or significant interval change. 2.  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 3.  Postoperative changes of right temporal craniotomy and mastoid surgery.   Original Report Authenticated By: Marin Roberts, M.D.      No diagnosis found.    MDM  Patient with a history of Seizures presents today  after a seizure.  He reports that he has been out of his Klonopin, which he reports he takes for seizures for the past 4 days.  He is back to baseline upon arrival in the ED aside from headache.  Patient offered Tylenol and Ibuprofen initially for headache, but only wanted narcotics.  Normal neurological exam.  Patient stable for discharge.          Pascal Lux [place], PA-C 04/23/13 1225

## 2013-04-22 NOTE — ED Notes (Signed)
Patient transported to CT 

## 2013-04-22 NOTE — ED Notes (Signed)
Pt dc'd home w/all belongings, alert ans ambulatory upon dc, pt verbalizes understanding of dc instructions and new mediations prescribed.

## 2013-04-22 NOTE — ED Notes (Signed)
Pt requesting pain medication, Heather PA informed, pt informed again he may have Tylenol or Ibuprofen, pt continues to refuse both medications

## 2013-04-22 NOTE — ED Notes (Signed)
Heather PA at bedside.  

## 2013-04-24 NOTE — ED Provider Notes (Signed)
Medical screening examination/treatment/procedure(s) were performed by non-physician practitioner and as supervising physician I was immediately available for consultation/collaboration.   Christinamarie Tall, MD 04/24/13 1543 

## 2013-04-30 ENCOUNTER — Ambulatory Visit (INDEPENDENT_AMBULATORY_CARE_PROVIDER_SITE_OTHER): Payer: Medicare Other | Admitting: Surgery

## 2013-04-30 ENCOUNTER — Encounter (HOSPITAL_COMMUNITY): Payer: Self-pay | Admitting: Emergency Medicine

## 2013-04-30 ENCOUNTER — Emergency Department (INDEPENDENT_AMBULATORY_CARE_PROVIDER_SITE_OTHER)
Admission: EM | Admit: 2013-04-30 | Discharge: 2013-04-30 | Disposition: A | Discharge status: Home / Self Care | Payer: Medicare Other | Source: Home / Self Care

## 2013-04-30 DIAGNOSIS — G8929 Other chronic pain: Secondary | ICD-10-CM

## 2013-04-30 DIAGNOSIS — M25569 Pain in unspecified knee: Secondary | ICD-10-CM

## 2013-04-30 DIAGNOSIS — F1910 Other psychoactive substance abuse, uncomplicated: Secondary | ICD-10-CM

## 2013-04-30 DIAGNOSIS — Z765 Malingerer [conscious simulation]: Secondary | ICD-10-CM

## 2013-04-30 MED ORDER — KETOROLAC TROMETHAMINE 30 MG/ML IJ SOLN
30.0000 mg | Freq: Once | INTRAMUSCULAR | Status: AC
  Administered 2013-04-30: 30 mg via INTRAVENOUS

## 2013-04-30 MED ORDER — KETOROLAC TROMETHAMINE 30 MG/ML IJ SOLN
30.0000 mg | Freq: Once | INTRAMUSCULAR | Status: AC
  Administered 2013-04-30: 30 mg via INTRAMUSCULAR

## 2013-04-30 MED ORDER — KETOROLAC TROMETHAMINE 30 MG/ML IJ SOLN
INTRAMUSCULAR | Status: AC
  Filled 2013-04-30: qty 1

## 2013-04-30 MED ORDER — TRAMADOL HCL 50 MG PO TABS: 50.0000 mg | ORAL_TABLET | Freq: Four times a day (QID) | ORAL | Status: DC | PRN

## 2013-04-30 NOTE — ED Notes (Signed)
Pt c/o right knee inj onset 3 days; repots falling down steps onto concrete Hx of knee surg; pain is 10/10 that's intermittent; pain radiates down to foot and right side  Was seeing [facility] but was dismissed due to multiple no shows; would like another referral  He is alert and oriented w/no signs of acute distress.

## 2013-04-30 NOTE — ED Provider Notes (Signed)
History     CSN: [id number]  Arrival date & time 04/30/13  1720   None     Chief Complaint  Patient presents with  . Knee Injury    (Consider location/radiation/quality/duration/timing/severity/associated sxs/prior treatment) HPI Comments: 42 year old male is complaining of right knee pain. This is chronic. He is well known to the emergency department in the urgent care for several frequent visits of chronic knee pain. He reports that he falls frequently and has seizures frequently. His story is quite similar when each visit in that he has fallen within the past 3 days and has knee pain. He is complaining of chronic pain in the knee radiating inferiorly into the hip. He has multiple excuses as to why he continues to seek emergency department urgent care other radiation dysuria and he is complaining about problems with Medicare and Medicaid, obtaining rides to the doctor and various other barriers that he is responsible for. He has been referred to different orthopedist as well as primary care providers but for one reason or the other he has an excuses for why he is unable to followup. He is seeking medications such as hydrocodone or Percocet.   Past Medical History  Diagnosis Date  . Depression   . GERD (gastroesophageal reflux disease)   . Migraine   . Ulcer     hx of stomach ulcer  . History of meningitis 2000  . 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    Past Surgical History  Procedure Laterality Date  . Brain surgery  2000    Removed growth?  . Orif tibia plateau  12/13/2011    Procedure: OPEN REDUCTION INTERNAL FIXATION (ORIF) TIBIAL PLATEAU;  Surgeon: Shelda Pal;  Location: [facility];  Service: Orthopedics;  Laterality: Right;  . Mastoidectomy Right 1/1/ 2000    Family History  Problem Relation Age of Onset  . Arthritis Mother   . COPD Mother   . Emphysema Mother   . Heart disease Mother   . Depression Father   . Cirrhosis Father    . Diabetes Maternal Aunt   . Diabetes Maternal Uncle     History  Substance Use Topics  . Smoking status: Former Smoker -- 1.50 packs/day    Types: Cigarettes    Quit date: 05/31/2011  . Smokeless tobacco: Never Used  . Alcohol Use: No      Review of Systems  Constitutional: Negative.   Respiratory: Negative.   Gastrointestinal: Negative.   Musculoskeletal: Positive for arthralgias.  Skin: Negative.   Neurological: Negative.     Allergies  Bee venom; Paroxetine hcl; Phenobarbital; Phenytoin; Tegretol; Topamax; and Prednisone  Home Medications   Current Outpatient Rx  Name  Route  Sig  Dispense  Refill  . clonazePAM (KLONOPIN) 0.5 MG tablet   Oral   Take 2 tablets (1 mg total) by mouth 3 (three) times daily.   60 tablet   0   . clonazePAM (KLONOPIN) 2 MG tablet   Oral   Take 2 mg by mouth 3 (three) times daily as needed for anxiety. For anxiety         . Multiple Vitamin (MULITIVITAMIN WITH MINERALS) TABS   Oral   Take 1 tablet by mouth every morning.          . ranitidine (ZANTAC) 300 MG tablet   Oral   Take 150-300 mg by mouth 2 (two) times daily. May take 1 additional dose if needed for heartburn         .  traMADol (ULTRAM) 50 MG tablet   Oral   Take 1 tablet (50 mg total) by mouth every 6 (six) hours as needed for pain.   12 tablet   0     BP 104/69  Pulse 91  Temp(Src) 98.4 F (36.9 C) (Oral)  Resp 16  SpO2 98%  Physical Exam  Nursing note and vitals reviewed. Constitutional: He is oriented to person, place, and time. He appears well-developed and well-nourished. No distress.  Eyes: Conjunctivae and EOM are normal.  Neck: Normal range of motion. Neck supple.  Pulmonary/Chest: Effort normal. No respiratory distress.  Musculoskeletal: He exhibits tenderness.  He exhibits an exaggerated response to withdrawal and grunting with light palpation to the knee. There is no discoloration or deformity. Flexion to just beyond 90 and extension  to 170. Any attempt to test for laxity such as drawer or with rotation he withdrawals and moans due to pain.  Neurological: He is alert and oriented to person, place, and time. He exhibits normal muscle tone.  Skin: Skin is warm and dry.  Psychiatric: He has a normal mood and affect.    ED Course  Procedures (including critical care time)  Labs Reviewed - No data to display No results found.   1. Chronic knee pain, right   2. Drug-seeking behavior       MDM  [name] Mcdonald is pleeding and begging for narcotic analgesics. I see no difference in the exam of his right lower extremity today and that of the documentation of several previous visits to the emergency apartment. He is threatening me in that he will call the medical board and report me for not prescribing narcotics.  Ultracet q 6 h prn pain #12 Toradol 30mg  IM  The patient apparently had listed restart was to be restricted requiring access bypass port only. Was having problems with my password and after several attempts was unable to enter his chart. I asked the front office person if she could remove the password protected classification after I had spoken with the patient. The patient states that he did not request to be on this restricted access and it was agreeable with a meal to have this classification changed. Therefore after my request to the front office she agreed to change his classification after the patient's told me that he did not request or asked to be on the restricted lifting.        [name] Rasmussen, NP 04/30/13 1846  [name] Rasmussen, NP 04/30/13 1850  [name] Rasmussen, NP 04/30/13 [phone]

## 2013-04-30 NOTE — ED Provider Notes (Signed)
Medical screening examination/treatment/procedure(s) were performed by resident physician or non-physician practitioner and as supervising physician I was immediately available for consultation/collaboration.   Neidy Guerrieri DOUGLAS MD.   Agamjot Kilgallon D Melissia Lahman, MD 04/30/13 1935 

## 2013-05-02 ENCOUNTER — Encounter (HOSPITAL_COMMUNITY): Payer: Self-pay | Admitting: Family Medicine

## 2013-05-02 ENCOUNTER — Emergency Department (HOSPITAL_COMMUNITY)
Admission: EM | Admit: 2013-05-02 | Discharge: 2013-05-02 | Discharge status: Against Medical Advice | Payer: Medicare Other | Attending: Emergency Medicine | Admitting: Emergency Medicine

## 2013-05-02 ENCOUNTER — Encounter (HOSPITAL_COMMUNITY): Payer: Self-pay | Admitting: Emergency Medicine

## 2013-05-02 ENCOUNTER — Emergency Department (HOSPITAL_COMMUNITY)
Admission: EM | Admit: 2013-05-02 | Discharge: 2013-05-02 | Disposition: A | Discharge status: Home / Self Care | Payer: Medicare Other | Attending: Emergency Medicine | Admitting: Emergency Medicine

## 2013-05-02 DIAGNOSIS — F3289 Other specified depressive episodes: Secondary | ICD-10-CM | POA: Insufficient documentation

## 2013-05-02 DIAGNOSIS — Z8719 Personal history of other diseases of the digestive system: Secondary | ICD-10-CM | POA: Insufficient documentation

## 2013-05-02 DIAGNOSIS — Z8661 Personal history of infections of the central nervous system: Secondary | ICD-10-CM | POA: Insufficient documentation

## 2013-05-02 DIAGNOSIS — Z79899 Other long term (current) drug therapy: Secondary | ICD-10-CM | POA: Insufficient documentation

## 2013-05-02 DIAGNOSIS — G8929 Other chronic pain: Secondary | ICD-10-CM | POA: Insufficient documentation

## 2013-05-02 DIAGNOSIS — Z862 Personal history of diseases of the blood and blood-forming organs and certain disorders involving the immune mechanism: Secondary | ICD-10-CM | POA: Insufficient documentation

## 2013-05-02 DIAGNOSIS — R569 Unspecified convulsions: Secondary | ICD-10-CM | POA: Insufficient documentation

## 2013-05-02 DIAGNOSIS — Z87891 Personal history of nicotine dependence: Secondary | ICD-10-CM | POA: Insufficient documentation

## 2013-05-02 DIAGNOSIS — N509 Disorder of male genital organs, unspecified: Secondary | ICD-10-CM | POA: Insufficient documentation

## 2013-05-02 DIAGNOSIS — F329 Major depressive disorder, single episode, unspecified: Secondary | ICD-10-CM | POA: Insufficient documentation

## 2013-05-02 DIAGNOSIS — K219 Gastro-esophageal reflux disease without esophagitis: Secondary | ICD-10-CM | POA: Insufficient documentation

## 2013-05-02 DIAGNOSIS — M25569 Pain in unspecified knee: Secondary | ICD-10-CM | POA: Insufficient documentation

## 2013-05-02 DIAGNOSIS — Z8669 Personal history of other diseases of the nervous system and sense organs: Secondary | ICD-10-CM | POA: Insufficient documentation

## 2013-05-02 DIAGNOSIS — Z8679 Personal history of other diseases of the circulatory system: Secondary | ICD-10-CM | POA: Insufficient documentation

## 2013-05-02 MED ORDER — IBUPROFEN 400 MG PO TABS
800.0000 mg | ORAL_TABLET | Freq: Once | ORAL | Status: AC
  Administered 2013-05-02: 800 mg via ORAL
  Filled 2013-05-02: qty 2

## 2013-05-02 MED ORDER — ACETAMINOPHEN 325 MG PO TABS
650.0000 mg | ORAL_TABLET | Freq: Once | ORAL | Status: AC
  Administered 2013-05-02: 650 mg via ORAL
  Filled 2013-05-02: qty 2

## 2013-05-02 NOTE — ED Provider Notes (Signed)
Medical screening examination/treatment/procedure(s) were performed by non-physician practitioner and as supervising physician I was immediately available for consultation/collaboration.  Zacory Fiola R. Sheri Prows, MD 05/02/13 1556 

## 2013-05-02 NOTE — ED Notes (Signed)
Pt called x2 no answer 

## 2013-05-02 NOTE — ED Provider Notes (Signed)
History     CSN: [id number]  Arrival date & time 05/02/13  1029   First MD Initiated Contact with Patient 05/02/13 1046      Chief Complaint  Patient presents with  . Knee Pain    (Consider location/radiation/quality/duration/timing/severity/associated sxs/prior treatment) Patient is a 42 y.o. male presenting with knee pain. The history is provided by the patient. No language interpreter was used.  Knee Pain Location:  Knee Time since incident: Chronic. Associated symptoms: no fever   Associated symptoms comment:  He returns to the ED with complaint of chronic knee pain. He is well known to the emergency department and has been seen 04/30/13 as well as had orthopedic follow up for same. He has been referred to Pain Management.    Past Medical History  Diagnosis Date  . Depression   . GERD (gastroesophageal reflux disease)   . Migraine   . Ulcer     hx of stomach ulcer  . History of meningitis 2000  . 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    Past Surgical History  Procedure Laterality Date  . Brain surgery  2000    Removed growth?  . Orif tibia plateau  12/13/2011    Procedure: OPEN REDUCTION INTERNAL FIXATION (ORIF) TIBIAL PLATEAU;  Surgeon: Shelda Pal;  Location: [facility];  Service: Orthopedics;  Laterality: Right;  . Mastoidectomy Right 1/1/ 2000    Family History  Problem Relation Age of Onset  . Arthritis Mother   . COPD Mother   . Emphysema Mother   . Heart disease Mother   . Depression Father   . Cirrhosis Father   . Diabetes Maternal Aunt   . Diabetes Maternal Uncle     History  Substance Use Topics  . Smoking status: Former Smoker -- 1.50 packs/day    Types: Cigarettes    Quit date: 05/31/2011  . Smokeless tobacco: Never Used  . Alcohol Use: No      Review of Systems  Constitutional: Negative for fever and chills.  HENT: Negative.   Musculoskeletal:       See HPI  Skin: Negative.  Negative for wound.   Neurological: Negative.  Negative for numbness.    Allergies  Bee venom; Paroxetine hcl; Phenobarbital; Phenytoin; Tegretol; Topamax; and Prednisone  Home Medications   Current Outpatient Rx  Name  Route  Sig  Dispense  Refill  . clonazePAM (KLONOPIN) 0.5 MG tablet   Oral   Take 2 tablets (1 mg total) by mouth 3 (three) times daily.   60 tablet   0   . clonazePAM (KLONOPIN) 2 MG tablet   Oral   Take 2 mg by mouth 3 (three) times daily as needed for anxiety. For anxiety         . Multiple Vitamin (MULITIVITAMIN WITH MINERALS) TABS   Oral   Take 1 tablet by mouth every morning.          . ranitidine (ZANTAC) 300 MG tablet   Oral   Take 150-300 mg by mouth 2 (two) times daily. May take 1 additional dose if needed for heartburn         . traMADol (ULTRAM) 50 MG tablet   Oral   Take 1 tablet (50 mg total) by mouth every 6 (six) hours as needed for pain.   12 tablet   0     BP 112/70  Pulse 75  Temp(Src) 98.3 F (36.8 C)  Resp 18  SpO2 96%  Physical Exam  Constitutional: He is oriented to person, place, and time. He appears well-developed and well-nourished.  Neck: Normal range of motion.  Cardiovascular: Intact distal pulses.   Pulmonary/Chest: Effort normal.  Musculoskeletal: Normal range of motion.  No gross swelling of right knee. Multiple well healed surgical scars. No deformities, no redness to suggest infection. Proximal and distal musculature soft and non-tender.   Neurological: He is alert and oriented to person, place, and time.  Skin: Skin is warm and dry.  Psychiatric: He has a normal mood and affect.    ED Course  Procedures (including critical care time)  Labs Reviewed - No data to display No results found.   No diagnosis found.  1. Chronic knee pain  MDM  Discussed cryotherapy and ibuprofen/tylenol use. Encouraged follow up with Pain Management.        Arnoldo Hooker, PA-C 05/02/13 1057

## 2013-05-02 NOTE — ED Notes (Signed)
Called to reassess and no answer 

## 2013-05-02 NOTE — ED Notes (Signed)
Per pt having knee pain and scared that he is going to fall and have a seizure due to the pain. sts normally takes percocet but doesn't;t have any. sts referred to pain clinic.

## 2013-05-02 NOTE — ED Notes (Signed)
Pt c/o right sided groin pain and testicle pain after bending over today; pt sts hx of hernia; pt seen here earlier today

## 2013-05-02 NOTE — ED Notes (Signed)
Pt given bus pass ?

## 2013-05-06 ENCOUNTER — Emergency Department (HOSPITAL_COMMUNITY)
Admission: EM | Admit: 2013-05-06 | Discharge: 2013-05-06 | Disposition: A | Discharge status: Home / Self Care | Payer: Medicare Other | Attending: Emergency Medicine | Admitting: Emergency Medicine

## 2013-05-06 ENCOUNTER — Emergency Department (HOSPITAL_COMMUNITY): Payer: Medicare Other

## 2013-05-06 DIAGNOSIS — K219 Gastro-esophageal reflux disease without esophagitis: Secondary | ICD-10-CM | POA: Insufficient documentation

## 2013-05-06 DIAGNOSIS — F3289 Other specified depressive episodes: Secondary | ICD-10-CM | POA: Insufficient documentation

## 2013-05-06 DIAGNOSIS — Y929 Unspecified place or not applicable: Secondary | ICD-10-CM | POA: Insufficient documentation

## 2013-05-06 DIAGNOSIS — S8990XA Unspecified injury of unspecified lower leg, initial encounter: Secondary | ICD-10-CM | POA: Insufficient documentation

## 2013-05-06 DIAGNOSIS — Z87891 Personal history of nicotine dependence: Secondary | ICD-10-CM | POA: Insufficient documentation

## 2013-05-06 DIAGNOSIS — F329 Major depressive disorder, single episode, unspecified: Secondary | ICD-10-CM | POA: Insufficient documentation

## 2013-05-06 DIAGNOSIS — Z79899 Other long term (current) drug therapy: Secondary | ICD-10-CM | POA: Insufficient documentation

## 2013-05-06 DIAGNOSIS — Y939 Activity, unspecified: Secondary | ICD-10-CM | POA: Insufficient documentation

## 2013-05-06 DIAGNOSIS — W19XXXA Unspecified fall, initial encounter: Secondary | ICD-10-CM | POA: Insufficient documentation

## 2013-05-06 DIAGNOSIS — Z8669 Personal history of other diseases of the nervous system and sense organs: Secondary | ICD-10-CM | POA: Insufficient documentation

## 2013-05-06 DIAGNOSIS — Z8719 Personal history of other diseases of the digestive system: Secondary | ICD-10-CM | POA: Insufficient documentation

## 2013-05-06 DIAGNOSIS — G40909 Epilepsy, unspecified, not intractable, without status epilepticus: Secondary | ICD-10-CM | POA: Insufficient documentation

## 2013-05-06 DIAGNOSIS — G43909 Migraine, unspecified, not intractable, without status migrainosus: Secondary | ICD-10-CM | POA: Insufficient documentation

## 2013-05-06 LAB — CBC WITH DIFFERENTIAL/PLATELET
HCT: 44.9 % (ref 39.0–52.0)
Hemoglobin: 15.6 g/dL (ref 13.0–17.0)
Lymphocytes Relative: 38 % (ref 12–46)
Lymphs Abs: 2.7 10*3/uL (ref 0.7–4.0)
Monocytes Absolute: 0.5 10*3/uL (ref 0.1–1.0)
Monocytes Relative: 7 % (ref 3–12)
Neutro Abs: 3.4 10*3/uL (ref 1.7–7.7)
Neutrophils Relative %: 48 % (ref 43–77)
RBC: 4.87 MIL/uL (ref 4.22–5.81)

## 2013-05-06 LAB — URINALYSIS, ROUTINE W REFLEX MICROSCOPIC
Bilirubin Urine: NEGATIVE
Glucose, UA: NEGATIVE mg/dL
Hgb urine dipstick: NEGATIVE
Specific Gravity, Urine: 1.012 (ref 1.005–1.030)
Urobilinogen, UA: 0.2 mg/dL (ref 0.0–1.0)
pH: 7 (ref 5.0–8.0)

## 2013-05-06 LAB — BASIC METABOLIC PANEL
Chloride: 107 mEq/L (ref 96–112)
GFR calc Af Amer: >90 mL/min (ref >90)
GFR calc non Af Amer: >90 mL/min (ref >90)
Glucose, Bld: 118 mg/dL — ABNORMAL HIGH (ref 70–99)
Potassium: 4.1 mEq/L (ref 3.5–5.1)
Sodium: 140 mEq/L (ref 135–145)

## 2013-05-06 LAB — RAPID URINE DRUG SCREEN, HOSP PERFORMED
Amphetamines: NOT DETECTED
Benzodiazepines: NOT DETECTED
Cocaine: POSITIVE — AB
Opiates: NOT DETECTED
Tetrahydrocannabinol: NOT DETECTED

## 2013-05-06 LAB — ETHANOL: Alcohol, Ethyl (B): <11 mg/dL (ref 0–11)

## 2013-05-06 MED ORDER — CLONAZEPAM 2 MG PO TABS: 2.0000 mg | ORAL_TABLET | Freq: Three times a day (TID) | ORAL | Status: DC | PRN

## 2013-05-06 MED ORDER — TRAMADOL HCL 50 MG PO TABS
50.0000 mg | ORAL_TABLET | Freq: Once | ORAL | Status: AC
  Administered 2013-05-06: 50 mg via ORAL
  Filled 2013-05-06: qty 1

## 2013-05-06 MED ORDER — CLONAZEPAM 0.5 MG PO TABS
2.0000 mg | ORAL_TABLET | Freq: Every day | ORAL | Status: DC
  Administered 2013-05-06: 2 mg via ORAL
  Filled 2013-05-06: qty 4

## 2013-05-06 NOTE — ED Provider Notes (Signed)
History     CSN: [id number]  Arrival date & time 05/06/13  1509   First MD Initiated Contact with Patient 05/06/13 1532      Chief Complaint  Patient presents with  . Seizures    (Consider location/radiation/quality/duration/timing/severity/associated sxs/prior treatment) HPI.... patient allegedly had a seizure x2 today.  He ran out of his Klonopin 2 days ago.  He is not postictal in emergency department .  Nothing makes symptoms better or worse. Severity is moderate .  Patient does not have a primary care relationship at this time.   No stiff neck, fever, chills, neuro deficits.  Past Medical History  Diagnosis Date  . Depression   . GERD (gastroesophageal reflux disease)   . Migraine   . Ulcer     hx of stomach ulcer  . History of meningitis 2000  . 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    Past Surgical History  Procedure Laterality Date  . Brain surgery  2000    Removed growth?  . Orif tibia plateau  12/13/2011    Procedure: OPEN REDUCTION INTERNAL FIXATION (ORIF) TIBIAL PLATEAU;  Surgeon: Shelda Pal;  Location: [facility];  Service: Orthopedics;  Laterality: Right;  . Mastoidectomy Right 1/1/ 2000    Family History  Problem Relation Age of Onset  . Arthritis Mother   . COPD Mother   . Emphysema Mother   . Heart disease Mother   . Depression Father   . Cirrhosis Father   . Diabetes Maternal Aunt   . Diabetes Maternal Uncle     History  Substance Use Topics  . Smoking status: Former Smoker -- 1.50 packs/day    Types: Cigarettes    Quit date: 05/31/2011  . Smokeless tobacco: Never Used  . Alcohol Use: No      Review of Systems  All other systems reviewed and are negative.    Allergies  Bee venom; Paroxetine hcl; Phenobarbital; Phenytoin; Tegretol; Topamax; and Prednisone  Home Medications   Current Outpatient Rx  Name  Route  Sig  Dispense  Refill  . clonazePAM (KLONOPIN) 2 MG tablet   Oral   Take 2 mg by  mouth 3 (three) times daily as needed for anxiety.         . Multiple Vitamin (MULITIVITAMIN WITH MINERALS) TABS   Oral   Take 1 tablet by mouth every morning.          [name] oxyCODONE (OXYCONTIN) 20 MG 12 hr tablet   Oral   Take 40 mg by mouth 2 (two) times daily.         . ranitidine (ZANTAC) 300 MG tablet   Oral   Take 300 mg by mouth 3 (three) times daily as needed for heartburn.         . clonazePAM (KLONOPIN) 2 MG tablet   Oral   Take 1 tablet (2 mg total) by mouth 3 (three) times daily as needed for anxiety.   21 tablet   0     BP 123/93  Pulse 79  Temp(Src) 98.2 F (36.8 C) (Oral)  Resp 26  SpO2 98%  Physical Exam  Nursing note and vitals reviewed. Constitutional: He is oriented to person, place, and time. He appears well-developed and well-nourished.  HENT:  Head: Normocephalic and atraumatic.  Eyes: Conjunctivae and EOM are normal. Pupils are equal, round, and reactive to light.  Neck: Normal range of motion. Neck supple.  Cardiovascular: Normal rate, regular rhythm  and normal heart sounds.   Pulmonary/Chest: Effort normal and breath sounds normal.  Abdominal: Soft. Bowel sounds are normal.  Musculoskeletal: Normal range of motion.  Neurological: He is alert and oriented to person, place, and time.  Skin: Skin is warm and dry.  Psychiatric: He has a normal mood and affect.    ED Course  Procedures (including critical care time)  Labs Reviewed  BASIC METABOLIC PANEL - Abnormal; Notable for the following:    Glucose, Bld 118 (*)    All other components within normal limits  CBC WITH DIFFERENTIAL - Abnormal; Notable for the following:    Eosinophils Relative 8 (*)    All other components within normal limits  URINE RAPID DRUG SCREEN (HOSP PERFORMED) - Abnormal; Notable for the following:    Cocaine POSITIVE (*)    All other components within normal limits  URINALYSIS, ROUTINE W REFLEX MICROSCOPIC  ETHANOL   Dg Tibia/fibula Right  05/06/2013    *RADIOLOGY REPORT*  Clinical Data: Right lower leg pain following a fall today.  RIGHT TIBIA AND FIBULA - 2 VIEW  Comparison: Right knee radiographs dated 09/10/2012.  Findings: Stable proximal tibial fixation hardware.  No acute fracture or dislocation.  IMPRESSION: No acute fracture or dislocation.   Original Report Authenticated By: Beckie Salts, M.D.     1. Seizure disorder       MDM  Patient's seizure disorder seems to be well-controlled on Klonopin. I will refill his Rx for one week. He understands to get primary care followup.  No neuro deficits in emergency department        Donnetta Hutching, MD 05/06/13 1816

## 2013-05-06 NOTE — ED Notes (Signed)
Per EMS: Pt has hx of seizures, ran out of klonopin x 2 days. Today had two un witnessed seizures in the bathroom. Pt reporting pain to right leg, but no obvious injury noted, ambulatory on scene. PT AO x 4. Pt reporting he is out of all his pain rx due to leaving PCP. BG 84 140/72, 90 SR, 16 RR, 100% RA.

## 2013-05-06 NOTE — ED Notes (Signed)
MD at bedside. 

## 2013-05-06 NOTE — ED Notes (Signed)
Pt given a diaper per request

## 2013-05-30 ENCOUNTER — Ambulatory Visit (INDEPENDENT_AMBULATORY_CARE_PROVIDER_SITE_OTHER): Payer: Medicare Other | Admitting: Surgery

## 2013-05-30 ENCOUNTER — Encounter (HOSPITAL_COMMUNITY): Payer: Self-pay | Admitting: Emergency Medicine

## 2013-05-30 ENCOUNTER — Emergency Department (HOSPITAL_COMMUNITY)
Admission: EM | Admit: 2013-05-30 | Discharge: 2013-05-30 | Disposition: A | Discharge status: Home / Self Care | Payer: Medicare Other | Attending: Emergency Medicine | Admitting: Emergency Medicine

## 2013-05-30 DIAGNOSIS — Z79899 Other long term (current) drug therapy: Secondary | ICD-10-CM | POA: Insufficient documentation

## 2013-05-30 DIAGNOSIS — F411 Generalized anxiety disorder: Secondary | ICD-10-CM | POA: Insufficient documentation

## 2013-05-30 DIAGNOSIS — F3289 Other specified depressive episodes: Secondary | ICD-10-CM | POA: Insufficient documentation

## 2013-05-30 DIAGNOSIS — F329 Major depressive disorder, single episode, unspecified: Secondary | ICD-10-CM | POA: Insufficient documentation

## 2013-05-30 DIAGNOSIS — R569 Unspecified convulsions: Secondary | ICD-10-CM

## 2013-05-30 DIAGNOSIS — Z87891 Personal history of nicotine dependence: Secondary | ICD-10-CM | POA: Insufficient documentation

## 2013-05-30 DIAGNOSIS — K259 Gastric ulcer, unspecified as acute or chronic, without hemorrhage or perforation: Secondary | ICD-10-CM | POA: Insufficient documentation

## 2013-05-30 DIAGNOSIS — R51 Headache: Secondary | ICD-10-CM | POA: Insufficient documentation

## 2013-05-30 DIAGNOSIS — F1410 Cocaine abuse, uncomplicated: Secondary | ICD-10-CM | POA: Insufficient documentation

## 2013-05-30 DIAGNOSIS — Z8661 Personal history of infections of the central nervous system: Secondary | ICD-10-CM | POA: Insufficient documentation

## 2013-05-30 DIAGNOSIS — K219 Gastro-esophageal reflux disease without esophagitis: Secondary | ICD-10-CM | POA: Insufficient documentation

## 2013-05-30 DIAGNOSIS — Z8679 Personal history of other diseases of the circulatory system: Secondary | ICD-10-CM | POA: Insufficient documentation

## 2013-05-30 DIAGNOSIS — G40909 Epilepsy, unspecified, not intractable, without status epilepticus: Secondary | ICD-10-CM | POA: Insufficient documentation

## 2013-05-30 LAB — BASIC METABOLIC PANEL
CO2: 24 mEq/L (ref 19–32)
Glucose, Bld: 91 mg/dL (ref 70–99)
Potassium: 4.5 mEq/L (ref 3.5–5.1)
Sodium: 141 mEq/L (ref 135–145)

## 2013-05-30 LAB — RAPID URINE DRUG SCREEN, HOSP PERFORMED: Barbiturates: NOT DETECTED

## 2013-05-30 LAB — CBC
MCH: 32.4 pg (ref 26.0–34.0)
MCHC: 35.3 g/dL (ref 30.0–36.0)
MCV: 91.8 fL (ref 78.0–100.0)
Platelets: 249 10*3/uL (ref 150–400)
RDW: 13.9 % (ref 11.5–15.5)

## 2013-05-30 LAB — URINALYSIS, ROUTINE W REFLEX MICROSCOPIC
Bilirubin Urine: NEGATIVE
Ketones, ur: NEGATIVE mg/dL
Nitrite: NEGATIVE
Protein, ur: NEGATIVE mg/dL
Specific Gravity, Urine: 1.011 (ref 1.005–1.030)
Urobilinogen, UA: 0.2 mg/dL (ref 0.0–1.0)

## 2013-05-30 MED ORDER — TRAMADOL HCL 50 MG PO TABS
50.0000 mg | ORAL_TABLET | Freq: Once | ORAL | Status: AC
  Administered 2013-05-30: 50 mg via ORAL
  Filled 2013-05-30: qty 1

## 2013-05-30 MED ORDER — CLONAZEPAM 0.5 MG PO TABS
2.0000 mg | ORAL_TABLET | Freq: Once | ORAL | Status: AC
  Administered 2013-05-30: 2 mg via ORAL
  Filled 2013-05-30: qty 4

## 2013-05-30 NOTE — ED Provider Notes (Signed)
Medical screening examination/treatment/procedure(s) were performed by non-physician practitioner and as supervising physician I was immediately available for consultation/collaboration.  Lyanne Co, MD 05/30/13 2101

## 2013-05-30 NOTE — ED Provider Notes (Signed)
History     CSN: [id number]  Arrival date & time 05/30/13  1909   First MD Initiated Contact with Patient 05/30/13 1918      Chief Complaint  Patient presents with  . Seizures    (Consider location/radiation/quality/duration/timing/severity/associated sxs/prior treatment) Patient is a 42 y.o. male presenting with seizures. The history is provided by the patient and medical records. No language interpreter was used.  Seizures Seizure activity on arrival: no   Preceding symptoms: no sensation of an aura present, no dizziness, no euphoria, no headache, no hyperventilation, no nausea, no numbness, no panic and no vision change   Initial focality:  None Postictal symptoms: no confusion and no memory loss   Return to baseline: yes   Timing:  Clustered Number of seizures this episode:  3 Progression:  Unchanged Context: not alcohol withdrawal, not cerebral palsy, not change in medication, not sleeping less, not developmental delay, not drug use, not emotional upset, not family hx of seizures, not fever, not flashing visual stimuli, not hydrocephalus, not intracranial lesion, not intracranial shunt, medical compliance, not possible hypoglycemia, not possible medication ingestion, not pregnant, not previous head injury and not stress   Recent head injury:  No recent head injuries PTA treatment:  None History of seizures: yes     Jurgen Groeneveld is a 42 y.o. male  with a hx of seizures presents to the Emergency Department complaining reported grand mal seizure x3 today as witnessed by mother. Pt states he does not remember this but endorses headache like the ones he has had before after his seizure.  Pt did not have a loss of bowel or bladder control.  Nothing seems to make it better or worse.  Pt denies fever, chills, neck pain, chest pain, SOB, abdominal pain, nausea, vomiting, diarrhea, weakness, dizziness, dysuria, hematuria.  Pt admits to cocaine use night before last.  Pt states he only did  one line.      Past Medical History  Diagnosis Date  . Depression   . GERD (gastroesophageal reflux disease)   . Migraine   . Ulcer     hx of stomach ulcer  . History of meningitis 2000  . 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    Past Surgical History  Procedure Laterality Date  . Brain surgery  2000    Removed growth?  . Orif tibia plateau  12/13/2011    Procedure: OPEN REDUCTION INTERNAL FIXATION (ORIF) TIBIAL PLATEAU;  Surgeon: Shelda Pal;  Location: [facility];  Service: Orthopedics;  Laterality: Right;  . Mastoidectomy Right 1/1/ 2000    Family History  Problem Relation Age of Onset  . Arthritis Mother   . COPD Mother   . Emphysema Mother   . Heart disease Mother   . Depression Father   . Cirrhosis Father   . Diabetes Maternal Aunt   . Diabetes Maternal Uncle     History  Substance Use Topics  . Smoking status: Former Smoker -- 1.50 packs/day    Types: Cigarettes    Quit date: 05/31/2011  . Smokeless tobacco: Never Used  . Alcohol Use: No      Review of Systems  Constitutional: Negative for fever, diaphoresis, appetite change, fatigue and unexpected weight change.  HENT: Negative for mouth sores and neck stiffness.   Eyes: Negative for visual disturbance.  Respiratory: Negative for cough, chest tightness, shortness of breath and wheezing.   Cardiovascular: Negative for chest pain.  Gastrointestinal: Negative for  nausea, vomiting, abdominal pain, diarrhea and constipation.  Endocrine: Negative for polydipsia, polyphagia and polyuria.  Genitourinary: Negative for dysuria, urgency, frequency and hematuria.  Musculoskeletal: Negative for back pain.  Skin: Negative for rash.  Allergic/Immunologic: Negative for immunocompromised state.  Neurological: Positive for seizures. Negative for syncope, light-headedness and headaches.  Hematological: Does not bruise/bleed easily.  Psychiatric/Behavioral: Negative for sleep  disturbance. The patient is not nervous/anxious.     Allergies  Bee venom; Paroxetine hcl; Phenobarbital; Phenytoin; Tegretol; Topamax; and Prednisone  Home Medications   Current Outpatient Rx  Name  Route  Sig  Dispense  Refill  . clonazePAM (KLONOPIN) 2 MG tablet   Oral   Take 2 mg by mouth 3 (three) times daily as needed for anxiety.         . Multiple Vitamin (MULITIVITAMIN WITH MINERALS) TABS   Oral   Take 1 tablet by mouth every morning.          [name] oxyCODONE (OXYCONTIN) 20 MG 12 hr tablet   Oral   Take 40 mg by mouth 2 (two) times daily.         . ranitidine (ZANTAC) 300 MG tablet   Oral   Take 300 mg by mouth 3 (three) times daily as needed for heartburn.           BP 114/76  Pulse 85  Temp(Src) 98.8 F (37.1 C) (Oral)  Resp 20  SpO2 98%  Physical Exam  Nursing note and vitals reviewed. Constitutional: He is oriented to person, place, and time. He appears well-developed and well-nourished. No distress.  HENT:  Head: Normocephalic and atraumatic.  Right Ear: Tympanic membrane, external ear and ear canal normal.  Left Ear: Tympanic membrane, external ear and ear canal normal.  Nose: Nose normal. No mucosal edema or rhinorrhea.  Mouth/Throat: Uvula is midline, oropharynx is clear and moist and mucous membranes are normal. Mucous membranes are not dry. No edematous. No oropharyngeal exudate, posterior oropharyngeal edema, posterior oropharyngeal erythema or tonsillar abscesses.  No oral trauma No contusion the patient's head  Eyes: Conjunctivae and EOM are normal. Pupils are equal, round, and reactive to light. No scleral icterus.  Neck: Normal range of motion. Neck supple.  Cardiovascular: Normal rate, regular rhythm, normal heart sounds and intact distal pulses.   No murmur heard. Pulmonary/Chest: Effort normal and breath sounds normal. No respiratory distress. He has no wheezes. He has no rales. He exhibits no tenderness.  Abdominal: Soft. Bowel sounds  are normal. He exhibits no mass. There is no tenderness. There is no rebound and no guarding.  Musculoskeletal: Normal range of motion. He exhibits no edema.  Lymphadenopathy:    He has no cervical adenopathy.  Neurological: He is alert and oriented to person, place, and time. He exhibits normal muscle tone. Coordination normal.  Speech is clear and goal oriented, follows commands Major Cranial nerves without deficit, no facial droop Normal strength in upper and lower extremities bilaterally including dorsiflexion and plantar flexion, strong and equal grip strength Sensation normal to light and sharp touch Moves extremities without ataxia, coordination intact Normal finger to nose and rapid alternating movements Neg romberg, no pronator drift Normal gait and balance  Skin: Skin is warm and dry. No rash noted. He is not diaphoretic. No erythema.  Psychiatric: He has a normal mood and affect. His behavior is normal.    ED Course  Procedures (including critical care time)  Labs Reviewed  URINE RAPID DRUG SCREEN (HOSP PERFORMED) - Abnormal; Notable for  the following:    Cocaine POSITIVE (*)    All other components within normal limits  URINALYSIS, ROUTINE W REFLEX MICROSCOPIC  ETHANOL  CBC  BASIC METABOLIC PANEL   No results found.   1. SEIZURE DISORDER   2. Cocaine abuse   3. ANXIETY       MDM  Aubry Tucholski presents with complaints of seizure activity however patient is no physical evidence of seizure activity. He states he was discharged from his PCP several weeks ago and has not been able to obtain his Klonopin.  He also reports headache today and request tramadol.  Patient without neurologic deficit.  Pt HA treated and improved while in ED.  Presentation is non concerning for [facility], ICH, Meningitis, or temporal arteritis. Pt is afebrile with no focal neuro deficits, nuchal rigidity, or change in vision. Patient's lab results unremarkable. Noted cocaine positive on urine drug  screen.  Pt is to follow up with PCP for further medication management. Pt verbalizes understanding and is agreeable with plan to dc. I have given him a I have also discussed reasons to return immediately to the ER.  Patient expresses understanding and agrees with plan.           Dahlia Client Atha Mcbain, PA-C 05/30/13 2057

## 2013-05-30 NOTE — ED Notes (Signed)
Mom called EMS stated he had 3 grand mal type seizures and was not his normal self during the intervals. EMS arrived he was A&O X4. No incontinence noted. No injury to his tongue stated he just had a headache. He's been out been out of meds X 2 weeks.

## 2013-05-31 ENCOUNTER — Other Ambulatory Visit: Payer: Self-pay

## 2013-05-31 ENCOUNTER — Emergency Department (HOSPITAL_COMMUNITY)
Admission: EM | Admit: 2013-05-31 | Discharge: 2013-05-31 | Discharge status: Against Medical Advice | Payer: Medicare Other | Attending: Emergency Medicine | Admitting: Emergency Medicine

## 2013-05-31 ENCOUNTER — Emergency Department (HOSPITAL_COMMUNITY): Payer: Medicare Other

## 2013-05-31 ENCOUNTER — Encounter (HOSPITAL_COMMUNITY): Payer: Self-pay | Admitting: *Deleted

## 2013-05-31 DIAGNOSIS — G8929 Other chronic pain: Secondary | ICD-10-CM

## 2013-05-31 DIAGNOSIS — Z8711 Personal history of peptic ulcer disease: Secondary | ICD-10-CM | POA: Insufficient documentation

## 2013-05-31 DIAGNOSIS — R52 Pain, unspecified: Secondary | ICD-10-CM | POA: Insufficient documentation

## 2013-05-31 DIAGNOSIS — Z8679 Personal history of other diseases of the circulatory system: Secondary | ICD-10-CM | POA: Insufficient documentation

## 2013-05-31 DIAGNOSIS — Z79899 Other long term (current) drug therapy: Secondary | ICD-10-CM | POA: Insufficient documentation

## 2013-05-31 DIAGNOSIS — M25579 Pain in unspecified ankle and joints of unspecified foot: Secondary | ICD-10-CM | POA: Insufficient documentation

## 2013-05-31 DIAGNOSIS — F3289 Other specified depressive episodes: Secondary | ICD-10-CM | POA: Insufficient documentation

## 2013-05-31 DIAGNOSIS — F329 Major depressive disorder, single episode, unspecified: Secondary | ICD-10-CM | POA: Insufficient documentation

## 2013-05-31 DIAGNOSIS — K219 Gastro-esophageal reflux disease without esophagitis: Secondary | ICD-10-CM | POA: Insufficient documentation

## 2013-05-31 DIAGNOSIS — Z8781 Personal history of (healed) traumatic fracture: Secondary | ICD-10-CM | POA: Insufficient documentation

## 2013-05-31 DIAGNOSIS — Z87891 Personal history of nicotine dependence: Secondary | ICD-10-CM | POA: Insufficient documentation

## 2013-05-31 DIAGNOSIS — Z9889 Other specified postprocedural states: Secondary | ICD-10-CM | POA: Insufficient documentation

## 2013-05-31 DIAGNOSIS — Z8661 Personal history of infections of the central nervous system: Secondary | ICD-10-CM | POA: Insufficient documentation

## 2013-05-31 DIAGNOSIS — Z8669 Personal history of other diseases of the nervous system and sense organs: Secondary | ICD-10-CM | POA: Insufficient documentation

## 2013-05-31 LAB — POCT I-STAT, CHEM 8
Chloride: 109 mEq/L (ref 96–112)
Creatinine, Ser: 0.8 mg/dL (ref 0.50–1.35)
Glucose, Bld: 91 mg/dL (ref 70–99)
HCT: 49 % (ref 39.0–52.0)
Potassium: 4.2 mEq/L (ref 3.5–5.1)
Sodium: 142 mEq/L (ref 135–145)

## 2013-05-31 MED ORDER — TRAMADOL HCL 50 MG PO TABS
50.0000 mg | ORAL_TABLET | Freq: Once | ORAL | Status: AC
  Administered 2013-05-31: 50 mg via ORAL
  Filled 2013-05-31: qty 1

## 2013-05-31 NOTE — ED Notes (Signed)
Pt returned from X-ray.  

## 2013-05-31 NOTE — ED Notes (Signed)
Pt's PA advised pt that she needed to review his test results.  Pt refused to wait.  Pt agitated and using foul language.  Pt advised of the risks of leaving AMA.  Pt signed out AMA.  [place] advised.

## 2013-05-31 NOTE — ED Notes (Signed)
Pt states he wants more pain medicine and we need to hurry up because when his aunt gets here to give him a ride she will not want to wait.

## 2013-05-31 NOTE — ED Notes (Signed)
Pt reports he ran out of Tramadol prescription for his chronic right knee pain and has been unable to obtain refill. Pt reports he "blacked out" for a couple of seconds this evening and states he fell to the ground. Pt states he does not actually remember falling but "came-to" quickly and states he has no other complaint besides his right knee pain. Pt notes numbness/tingling in RLE below the knee.

## 2013-05-31 NOTE — ED Notes (Addendum)
Pt states that he is having right knee pain since he says he fell down stairs today. pt states he does not remember falling but remembers being at the top of the stairs and then he was at the bottom. Pt has previous history of surgery on right knee. Pt seen 2 weeks ago and had x-rays performed for same knee after pain in that right knee after seizure.pt wants tramadol for pain. Pt ambulatory.

## 2013-05-31 NOTE — ED Notes (Signed)
Pt refuses to wait for PA for d/c instructions.  Pt signed out AMA.

## 2013-05-31 NOTE — ED Provider Notes (Signed)
History     CSN: [id number]  Arrival date & time 05/31/13  2047   First MD Initiated Contact with Patient 05/31/13 2203      Chief Complaint  Patient presents with  . Knee Pain    right    (Consider location/radiation/quality/duration/timing/severity/associated sxs/prior treatment) HPI History provided by pt.   Pt c/o severe right knee pain.  Has chronic knee pain s/p tibeal plateau fx 1.57yrs ago, but pain acutely worsened after blacking out while petting his dog today.  When he bears weight on RLE, he feels pins and needles in his right foot and knee pain is aggravated.    Has an appointment with new pain management physician on 7/13 and is in the process of finding a new PCP.   Past Medical History  Diagnosis Date  . Depression   . GERD (gastroesophageal reflux disease)   . Migraine   . Ulcer     hx of stomach ulcer  . History of meningitis 2000  . 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    Past Surgical History  Procedure Laterality Date  . Brain surgery  2000    Removed growth?  . Orif tibia plateau  12/13/2011    Procedure: OPEN REDUCTION INTERNAL FIXATION (ORIF) TIBIAL PLATEAU;  Surgeon: Shelda Pal;  Location: [facility];  Service: Orthopedics;  Laterality: Right;  . Mastoidectomy Right 1/1/ 2000    Family History  Problem Relation Age of Onset  . Arthritis Mother   . COPD Mother   . Emphysema Mother   . Heart disease Mother   . Depression Father   . Cirrhosis Father   . Diabetes Maternal Aunt   . Diabetes Maternal Uncle     History  Substance Use Topics  . Smoking status: Former Smoker -- 1.50 packs/day    Types: Cigarettes    Quit date: 05/31/2011  . Smokeless tobacco: Never Used  . Alcohol Use: No      Review of Systems  All other systems reviewed and are negative.    Allergies  Bee venom; Paroxetine hcl; Phenobarbital; Phenytoin; Tegretol; Topamax; and Prednisone  Home Medications   Current Outpatient  Rx  Name  Route  Sig  Dispense  Refill  . albuterol (PROVENTIL HFA;VENTOLIN HFA) 108 (90 BASE) MCG/ACT inhaler   Inhalation   Inhale 2 puffs into the lungs every 6 (six) hours as needed for wheezing.         . Aspirin-Salicylamide-Caffeine (BC HEADACHE POWDER PO)   Oral   Take 1 packet by mouth every 4 (four) hours as needed (for pain).         . clonazePAM (KLONOPIN) 2 MG tablet   Oral   Take 2 mg by mouth 3 (three) times daily as needed for anxiety.         . Multiple Vitamin (MULITIVITAMIN WITH MINERALS) TABS   Oral   Take 1 tablet by mouth every morning.          [name] oxyCODONE (OXYCONTIN) 20 MG 12 hr tablet   Oral   Take 40 mg by mouth 2 (two) times daily.         . ranitidine (ZANTAC) 300 MG tablet   Oral   Take 300 mg by mouth 3 (three) times daily as needed for heartburn.         . traMADol (ULTRAM) 50 MG tablet   Oral   Take 50-100 mg by mouth every 6 (six)  hours as needed for pain.         . vitamin B-12 (CYANOCOBALAMIN) 1000 MCG tablet   Oral   Take 2,000 mcg by mouth daily.           BP 115/71  Pulse 88  Temp(Src) 98.2 F (36.8 C) (Oral)  Resp 16  Ht 5\' 4"  (1.626 m)  Wt 137 lb (62.143 kg)  BMI 23.5 kg/m2  SpO2 98%  Physical Exam  Nursing note and vitals reviewed. Constitutional: He is oriented to person, place, and time. He appears well-developed and well-nourished. No distress.  HENT:  Head: Normocephalic and atraumatic.  Eyes:  Normal appearance  Neck: Normal range of motion.  Cardiovascular: Normal rate and regular rhythm.   Pulmonary/Chest: Effort normal and breath sounds normal. No respiratory distress.  Musculoskeletal: Normal range of motion.  Surgical scars right anterior knee.  No edema, ecchymosis, erythema or warmth.  Diffuse tenderness.  Full ROM but reports that it is painful.  No pain w/ ROM of hip/ankle.  2+ DP pulse and distal sensation intact.    Neurological: He is alert and oriented to person, place, and time.   Skin: Skin is warm and dry. No rash noted.  Psychiatric: He has a normal mood and affect. His behavior is normal.    ED Course  Procedures (including critical care time)  Labs Reviewed - No data to display No results found.   1. Chronic pain       MDM  41yo M who is seen in ED frequently, multiple times for acute on chronic right knee pain, presents w/ R knee pain attributed to injury during syncopal episode while petting his dog this evening.  Able to bear weight and low suspicion for fx based on exam.  Xray pending.  Pt to receive one tramadol for pain.  EKG and Chem 8 pending to evaluate for reported syncope.   Pt angry with his wait time and per nursing staff, began to yell and curse and then stormed out of ED.  His EKG had not yet been performed.  Xray stable and Chem 8 unremarkable.  Suspect malingering.         Otilio Miu, PA-C 06/01/13 0101

## 2013-06-02 NOTE — ED Provider Notes (Signed)
Medical screening examination/treatment/procedure(s) were performed by non-physician practitioner and as supervising physician I was immediately available for consultation/collaboration.  Roxanna Mcever L Minela [place], MD 06/02/13 0736 

## 2013-06-05 ENCOUNTER — Emergency Department (HOSPITAL_COMMUNITY)
Admission: EM | Admit: 2013-06-05 | Discharge: 2013-06-05 | Disposition: A | Discharge status: Home / Self Care | Payer: Medicare Other | Attending: Emergency Medicine | Admitting: Emergency Medicine

## 2013-06-05 ENCOUNTER — Encounter (HOSPITAL_COMMUNITY): Payer: Self-pay | Admitting: Emergency Medicine

## 2013-06-05 ENCOUNTER — Emergency Department (HOSPITAL_COMMUNITY): Payer: Medicare Other

## 2013-06-05 DIAGNOSIS — Z87891 Personal history of nicotine dependence: Secondary | ICD-10-CM | POA: Insufficient documentation

## 2013-06-05 DIAGNOSIS — Z8711 Personal history of peptic ulcer disease: Secondary | ICD-10-CM | POA: Insufficient documentation

## 2013-06-05 DIAGNOSIS — R5381 Other malaise: Secondary | ICD-10-CM | POA: Insufficient documentation

## 2013-06-05 DIAGNOSIS — F329 Major depressive disorder, single episode, unspecified: Secondary | ICD-10-CM | POA: Insufficient documentation

## 2013-06-05 DIAGNOSIS — Z79899 Other long term (current) drug therapy: Secondary | ICD-10-CM | POA: Insufficient documentation

## 2013-06-05 DIAGNOSIS — R55 Syncope and collapse: Secondary | ICD-10-CM | POA: Insufficient documentation

## 2013-06-05 DIAGNOSIS — R569 Unspecified convulsions: Secondary | ICD-10-CM

## 2013-06-05 DIAGNOSIS — Z8679 Personal history of other diseases of the circulatory system: Secondary | ICD-10-CM | POA: Insufficient documentation

## 2013-06-05 DIAGNOSIS — R110 Nausea: Secondary | ICD-10-CM | POA: Insufficient documentation

## 2013-06-05 DIAGNOSIS — K219 Gastro-esophageal reflux disease without esophagitis: Secondary | ICD-10-CM | POA: Insufficient documentation

## 2013-06-05 DIAGNOSIS — Z8661 Personal history of infections of the central nervous system: Secondary | ICD-10-CM | POA: Insufficient documentation

## 2013-06-05 DIAGNOSIS — R51 Headache: Secondary | ICD-10-CM | POA: Insufficient documentation

## 2013-06-05 DIAGNOSIS — F3289 Other specified depressive episodes: Secondary | ICD-10-CM | POA: Insufficient documentation

## 2013-06-05 LAB — BASIC METABOLIC PANEL WITH GFR
BUN: 8 mg/dL (ref 6–23)
CO2: 24 meq/L (ref 19–32)
Calcium: 9 mg/dL (ref 8.4–10.5)
Chloride: 105 meq/L (ref 96–112)
Creatinine, Ser: 0.76 mg/dL (ref 0.50–1.35)
GFR calc Af Amer: >90 mL/min
GFR calc non Af Amer: >90 mL/min
Glucose, Bld: 94 mg/dL (ref 70–99)
Potassium: 3.8 meq/L (ref 3.5–5.1)
Sodium: 137 meq/L (ref 135–145)

## 2013-06-05 LAB — CBC
HCT: 43.5 % (ref 39.0–52.0)
Hemoglobin: 15 g/dL (ref 13.0–17.0)
MCH: 31.5 pg (ref 26.0–34.0)
MCHC: 34.5 g/dL (ref 30.0–36.0)
MCV: 91.4 fL (ref 78.0–100.0)
Platelets: 258 K/uL (ref 150–400)
RBC: 4.76 MIL/uL (ref 4.22–5.81)
RDW: 14.1 % (ref 11.5–15.5)
WBC: 10.6 K/uL — ABNORMAL HIGH (ref 4.0–10.5)

## 2013-06-05 LAB — URINALYSIS, ROUTINE W REFLEX MICROSCOPIC
Bilirubin Urine: NEGATIVE
Glucose, UA: NEGATIVE mg/dL
Hgb urine dipstick: NEGATIVE
Ketones, ur: NEGATIVE mg/dL
Leukocytes, UA: NEGATIVE
Nitrite: NEGATIVE
Protein, ur: NEGATIVE mg/dL
Specific Gravity, Urine: 1.013 (ref 1.005–1.030)
Urobilinogen, UA: 1 mg/dL (ref 0.0–1.0)
pH: 7 (ref 5.0–8.0)

## 2013-06-05 LAB — RAPID URINE DRUG SCREEN, HOSP PERFORMED
Amphetamines: POSITIVE — AB
Barbiturates: NOT DETECTED
Benzodiazepines: NOT DETECTED
Cocaine: POSITIVE — AB
Opiates: NOT DETECTED
Tetrahydrocannabinol: POSITIVE — AB

## 2013-06-05 LAB — URINE MICROSCOPIC-ADD ON

## 2013-06-05 MED ORDER — TRAMADOL HCL 50 MG PO TABS
50.0000 mg | ORAL_TABLET | Freq: Once | ORAL | Status: AC
  Administered 2013-06-05: 50 mg via ORAL
  Filled 2013-06-05: qty 1

## 2013-06-05 MED ORDER — ONDANSETRON 4 MG PO TBDP
4.0000 mg | ORAL_TABLET | Freq: Once | ORAL | Status: AC
  Administered 2013-06-05: 4 mg via ORAL
  Filled 2013-06-05: qty 1

## 2013-06-05 MED ORDER — CLONAZEPAM 0.5 MG PO TABS: 2.0000 mg | ORAL_TABLET | Freq: Three times a day (TID) | ORAL | Status: DC | PRN

## 2013-06-05 NOTE — ED Notes (Signed)
States had a sz this am and fell brought in by [facility]

## 2013-06-05 NOTE — ED Provider Notes (Signed)
History    CSN: [id number] Arrival date & time 06/05/13  1306  First MD Initiated Contact with Patient 06/05/13 1703     Chief Complaint  Patient presents with  . Seizures   (Consider location/radiation/quality/duration/timing/severity/associated sxs/prior Treatment) HPI Pt is a 41yo male with hx of seizures BIB EMS after having seizure in court earlier this afternoon. Pt states he does not recall the seizure.  States he was standing in court and the next thing he noticed when he woke up was that he had a generalized headache, 8/10, mild nausea, and was lying on a stretcher.  States he does not know if he hit his head on anything.  Pt states he does get headaches after his seizures but this headache is more severe.  States he use to see Dr. Ronne Binning, PCP for his seizure medication, Klonopin, however 3 weeks ago they had a "falling out" so he has been off of his klonipin for 2 days now.  Reports slight cough but otherwise no other recent illness, fever, n/v/d.    Past Medical History  Diagnosis Date  . Depression   . GERD (gastroesophageal reflux disease)   . Migraine   . Ulcer     hx of stomach ulcer  . History of meningitis 2000  . 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   Past Surgical History  Procedure Laterality Date  . Brain surgery  2000    Removed growth?  . Orif tibia plateau  12/13/2011    Procedure: OPEN REDUCTION INTERNAL FIXATION (ORIF) TIBIAL PLATEAU;  Surgeon: Shelda Pal;  Location: [facility];  Service: Orthopedics;  Laterality: Right;  . Mastoidectomy Right 1/1/ 2000   Family History  Problem Relation Age of Onset  . Arthritis Mother   . COPD Mother   . Emphysema Mother   . Heart disease Mother   . Depression Father   . Cirrhosis Father   . Diabetes Maternal Aunt   . Diabetes Maternal Uncle    History  Substance Use Topics  . Smoking status: Former Smoker -- 1.50 packs/day    Types: Cigarettes    Quit date:  05/31/2011  . Smokeless tobacco: Never Used  . Alcohol Use: No    Review of Systems  Constitutional: Positive for fatigue. Negative for fever, chills and appetite change.  Cardiovascular: Negative for chest pain.  Skin: Negative for wound.  Neurological: Positive for tremors, seizures, syncope and headaches. Negative for dizziness, speech difficulty and light-headedness.  All other systems reviewed and are negative.    Allergies  Bee venom; Paroxetine hcl; Phenobarbital; Phenytoin; Tegretol; Topamax; and Prednisone  Home Medications   Current Outpatient Rx  Name  Route  Sig  Dispense  Refill  . albuterol (PROVENTIL HFA;VENTOLIN HFA) 108 (90 BASE) MCG/ACT inhaler   Inhalation   Inhale 2 puffs into the lungs every 6 (six) hours as needed for wheezing.         . Aspirin-Salicylamide-Caffeine (BC HEADACHE POWDER PO)   Oral   Take 1 packet by mouth every 4 (four) hours as needed (for pain).         . clonazePAM (KLONOPIN) 2 MG tablet   Oral   Take 2 mg by mouth 3 (three) times daily as needed for anxiety.         [name] oxyCODONE (OXYCONTIN) 20 MG 12 hr tablet   Oral   Take 40 mg by mouth 2 (two) times daily.         [name]  ranitidine (ZANTAC) 300 MG tablet   Oral   Take 300 mg by mouth 3 (three) times daily as needed for heartburn.         . traMADol (ULTRAM) 50 MG tablet   Oral   Take 50-100 mg by mouth every 6 (six) hours as needed for pain.         . vitamin B-12 (CYANOCOBALAMIN) 1000 MCG tablet   Oral   Take 2,000 mcg by mouth daily.         . clonazePAM (KLONOPIN) 0.5 MG tablet   Oral   Take 4 tablets (2 mg total) by mouth 3 (three) times daily as needed for anxiety.   15 tablet   0    BP 119/86  Pulse 67  Temp(Src) 98.1 F (36.7 C) (Oral)  Resp 18  SpO2 98% Physical Exam  Nursing note and vitals reviewed. Constitutional: He is oriented to person, place, and time. He appears well-developed and well-nourished. No distress.  HENT:  Head:  Normocephalic and atraumatic.  Eyes: Conjunctivae and EOM are normal. Pupils are equal, round, and reactive to light. Right eye exhibits no discharge. Left eye exhibits no discharge. No scleral icterus.  Neck: Normal range of motion. Neck supple.  No midline bony tenderness, crepitus, or step-offs  Cardiovascular: Normal rate, regular rhythm and normal heart sounds.   Pulmonary/Chest: Effort normal and breath sounds normal. No respiratory distress. He has no wheezes. He has no rales. He exhibits no tenderness.  Abdominal: Soft. Bowel sounds are normal. He exhibits no distension and no mass. There is no tenderness. There is no rebound and no guarding.  Musculoskeletal: Normal range of motion.  Neurological: He is alert and oriented to person, place, and time. He has normal strength. No cranial nerve deficit or sensory deficit. He displays a negative Romberg sign. Coordination and gait normal. GCS eye subscore is 4. GCS verbal subscore is 5. GCS motor subscore is 6.  CN II-XII in tact, no focal deficit, nl finger to nose coordination. Nl sensation, 5/5 strength in all major muscle groups. Neg romberg and nl gait.   Skin: Skin is dry. He is not diaphoretic.    ED Course  Procedures (including critical care time) Labs Reviewed  CBC - Abnormal; Notable for the following:    WBC 10.6 (*)    All other components within normal limits  URINALYSIS, ROUTINE W REFLEX MICROSCOPIC - Abnormal; Notable for the following:    APPearance TURBID (*)    All other components within normal limits  BASIC METABOLIC PANEL  URINE MICROSCOPIC-ADD ON  URINE RAPID DRUG SCREEN (HOSP PERFORMED)   Ct Head Wo Contrast  06/05/2013   *RADIOLOGY REPORT*  Clinical Data: Severe headache.  Seizure this morning.  The patient fell.  CT HEAD WITHOUT CONTRAST  Technique:  Contiguous axial images were obtained from the base of the skull through the vertex without contrast.  Comparison: 04/22/2013  Findings: There is no acute  intracranial hemorrhage, infarction, or mass lesion.  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  Brain parenchyma is otherwise normal. Previous right mastoid surgery.  Osseous structures  are otherwise normal.  IMPRESSION: No acute abnormality.   Original Report Authenticated By: Francene Boyers, M.D.   1. Seizure     MDM  Pt with hx of seizures has been off klonipin for 2 days because he stopped seeing his PCP due to a "falling out," Pt experienced seizure today.  A&Ox3 upon arrival, nl  neuro exam.  C/o moderate to severe headache, unsure if he hit his head.  Will do head CT.  Labs-CBC, BMP, UA: unremarkable. Head CT: no acute abnormality. Pt states he ran out of his klonipin and cannot get into neurologist Dr. Sandria Manly, until July. Will discharge pt home with 5 day refill of Klonipin.  Pt informed he needs to reestablish primary care for ongoing medication refills and f/u.  F/u with PCP, [place] [facility] info provided. Return precautions given. Pt verbalized understanding and agreement with tx plan. Vitals: unremarkable. Discharged in stable condition.     Junius Finner, PA-C 06/05/13 2042

## 2013-06-06 NOTE — ED Provider Notes (Signed)
Medical screening examination/treatment/procedure(s) were performed by non-physician practitioner and as supervising physician I was immediately available for consultation/collaboration.   Carleene Cooper III, MD 06/06/13 [phone]

## 2013-07-02 ENCOUNTER — Ambulatory Visit (INDEPENDENT_AMBULATORY_CARE_PROVIDER_SITE_OTHER): Payer: Medicare Other | Admitting: Surgery

## 2013-07-09 ENCOUNTER — Encounter (INDEPENDENT_AMBULATORY_CARE_PROVIDER_SITE_OTHER): Payer: Self-pay | Admitting: Surgery

## 2013-07-17 ENCOUNTER — Emergency Department (HOSPITAL_COMMUNITY)
Admission: EM | Admit: 2013-07-17 | Discharge: 2013-07-17 | Discharge status: Against Medical Advice | Payer: Medicare Other | Attending: Emergency Medicine | Admitting: Emergency Medicine

## 2013-07-17 ENCOUNTER — Encounter (HOSPITAL_COMMUNITY): Payer: Self-pay | Admitting: Emergency Medicine

## 2013-07-17 DIAGNOSIS — R259 Unspecified abnormal involuntary movements: Secondary | ICD-10-CM | POA: Insufficient documentation

## 2013-07-17 NOTE — ED Notes (Signed)
Pt presents to ED via EMS after friend stated he had a seizure that lasted around 3 minutes. When EMS arrived, patient alert and oriented, no incontinence noted no deficits noted, no c/o pain from patient. Patient taken to triage via EMS. CBG 78 with EMS.

## 2013-07-17 NOTE — ED Notes (Signed)
Patient observed rolling himself around ED waiting room in wheelchair, and outside smoking. Patient told he had to wait inside and to not smoke.

## 2013-07-19 ENCOUNTER — Other Ambulatory Visit (HOSPITAL_COMMUNITY): Payer: Self-pay | Admitting: Orthopedic Surgery

## 2013-07-27 ENCOUNTER — Encounter (HOSPITAL_COMMUNITY): Payer: Self-pay

## 2013-07-27 ENCOUNTER — Emergency Department (HOSPITAL_COMMUNITY)
Admission: EM | Admit: 2013-07-27 | Discharge: 2013-07-27 | Discharge status: Against Medical Advice | Payer: Medicare Other | Source: Home / Self Care

## 2013-07-27 ENCOUNTER — Encounter (HOSPITAL_COMMUNITY)
Admission: RE | Admit: 2013-07-27 | Discharge: 2013-07-27 | Disposition: A | Discharge status: Home / Self Care | Payer: Medicare Other | Source: Ambulatory Visit | Attending: Orthopedic Surgery | Admitting: Orthopedic Surgery

## 2013-07-27 ENCOUNTER — Encounter (HOSPITAL_COMMUNITY): Payer: Self-pay | Admitting: Pharmacy Technician

## 2013-07-27 DIAGNOSIS — Z01812 Encounter for preprocedural laboratory examination: Secondary | ICD-10-CM | POA: Insufficient documentation

## 2013-07-27 DIAGNOSIS — Z01818 Encounter for other preprocedural examination: Secondary | ICD-10-CM | POA: Insufficient documentation

## 2013-07-27 HISTORY — DX: Restless legs syndrome: G25.81

## 2013-07-27 HISTORY — DX: Panic disorder (episodic paroxysmal anxiety): F41.0

## 2013-07-27 HISTORY — DX: Shortness of breath: R06.02

## 2013-07-27 HISTORY — DX: Fibromyalgia: M79.7

## 2013-07-27 HISTORY — DX: Unspecified convulsions: R56.9

## 2013-07-27 LAB — COMPREHENSIVE METABOLIC PANEL
ALT: 8 U/L (ref 0–53)
AST: 17 U/L (ref 0–37)
Alkaline Phosphatase: 76 U/L (ref 39–117)
CO2: 25 mEq/L (ref 19–32)
Chloride: 105 mEq/L (ref 96–112)
GFR calc Af Amer: >90 mL/min (ref >90)
GFR calc non Af Amer: >90 mL/min (ref >90)
Glucose, Bld: 83 mg/dL (ref 70–99)
Sodium: 140 mEq/L (ref 135–145)
Total Bilirubin: 0.2 mg/dL — ABNORMAL LOW (ref 0.3–1.2)

## 2013-07-27 LAB — CBC
Hemoglobin: 14.4 g/dL (ref 13.0–17.0)
MCH: 32.5 pg (ref 26.0–34.0)
Platelets: 269 10*3/uL (ref 150–400)
RBC: 4.43 MIL/uL (ref 4.22–5.81)
WBC: 10.7 10*3/uL — ABNORMAL HIGH (ref 4.0–10.5)

## 2013-07-27 NOTE — Pre-Procedure Instructions (Signed)
[name] Mcdonald  07/27/2013   Your procedure is scheduled on:  08-01-2013  Wednesday   Report to [facility] at 8:30 AM.   Call this number if you have problems the morning of surgery: [phone]   Remember:   Do not eat food or drink liquids after midnight.    Take these medicines the morning of surgery with A SIP OF WATER: inhaler if needed,clonazepam if needed,pain medication if needed,   Do not wear jewelry  Do not wear lotions, powders, or perfumes.   Do not shave 48 hours prior to surgery. Men may shave face and neck.  Do not bring valuables to the hospital.  [facility] is not responsible for any belongings or valuables.  Contacts, dentures or bridgework may not be worn into surgery.  Leave suitcase in the car. After surgery it may be brought to your room.   For patients admitted to the hospital, checkout time is 11:00 AM the day of discharge.   Patients discharged the day of surgery will not be allowed to drive home.   Name and phone number of your driver:    Special Instructions: Shower using CHG 2 nights before surgery and the night before surgery.  If you shower the day of surgery use CHG.  Use special wash - you have one bottle of CHG for all showers.  You should use approximately 1/3 of the bottle for each shower.   Please read over the following fact sheets that you were given: Pain Booklet, Coughing and Deep Breathing and Surgical Site Infection Prevention

## 2013-07-27 NOTE — ED Notes (Signed)
C/o pain in right knee; stated history of surgery for fx of tibial plateau, and scheduled surgery for removal of plates. Has reportedly fallen several x times past few weeks, an re injured knee; c/o unable to sleep at night due to pain. [name] NAD at present . Was observed to walk in free and fluid manner from car to clinic entrance, then collapse "due to pain"

## 2013-07-30 NOTE — Progress Notes (Signed)
Anesthesia chart review:  Patient is a 42 year old male scheduled for removal of deep retained hardware, right knee on 08/01/13 by Dr. Lajoyce Corners.  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    EKG on 05/31/13 showed NSR, incomplete right BBB.    Preoperative labs noted.  He will be evaluated by his assigned anesthesiologist on the day of surgery, but anticipate that he can proceed as planned if no acute changes.  Velna Ochs [facility]/Anesthesiology Phone [phone] 07/30/2013 1:39 PM

## 2013-07-31 MED ORDER — CEFAZOLIN SODIUM-DEXTROSE 2-3 GM-% IV SOLR
2.0000 g | INTRAVENOUS | Status: AC
  Administered 2013-08-01: 2 g via INTRAVENOUS
  Filled 2013-07-31: qty 50

## 2013-07-31 NOTE — Progress Notes (Signed)
Spoke with pt and informed him of surgery time change and to arrive at 0800 with stated understanding

## 2013-07-31 NOTE — Progress Notes (Signed)
I was unable to get in touch with pt for time change.Called and spoke to [place] at Dr.Duda's office and states that she did call him and he is aware to arrive at [facility]

## 2013-08-01 ENCOUNTER — Encounter (HOSPITAL_COMMUNITY)
Admission: RE | Disposition: A | Discharge status: Home / Self Care | Payer: Self-pay | Source: Ambulatory Visit | Attending: Orthopedic Surgery

## 2013-08-01 ENCOUNTER — Encounter (HOSPITAL_COMMUNITY): Payer: Self-pay | Admitting: Vascular Surgery

## 2013-08-01 ENCOUNTER — Ambulatory Visit (HOSPITAL_COMMUNITY): Payer: Medicare Other | Admitting: Anesthesiology

## 2013-08-01 ENCOUNTER — Ambulatory Visit (HOSPITAL_COMMUNITY)
Admission: RE | Admit: 2013-08-01 | Discharge: 2013-08-01 | Disposition: A | Discharge status: Home / Self Care | Payer: Medicare Other | Source: Ambulatory Visit | Attending: Orthopedic Surgery | Admitting: Orthopedic Surgery

## 2013-08-01 ENCOUNTER — Encounter (HOSPITAL_COMMUNITY): Payer: Self-pay | Admitting: Anesthesiology

## 2013-08-01 DIAGNOSIS — Z8661 Personal history of infections of the central nervous system: Secondary | ICD-10-CM | POA: Insufficient documentation

## 2013-08-01 DIAGNOSIS — F410 Panic disorder [episodic paroxysmal anxiety] without agoraphobia: Secondary | ICD-10-CM | POA: Insufficient documentation

## 2013-08-01 DIAGNOSIS — M25569 Pain in unspecified knee: Secondary | ICD-10-CM | POA: Insufficient documentation

## 2013-08-01 DIAGNOSIS — G40802 Other epilepsy, not intractable, without status epilepticus: Secondary | ICD-10-CM | POA: Insufficient documentation

## 2013-08-01 DIAGNOSIS — Z8711 Personal history of peptic ulcer disease: Secondary | ICD-10-CM | POA: Insufficient documentation

## 2013-08-01 DIAGNOSIS — Z91038 Other insect allergy status: Secondary | ICD-10-CM | POA: Insufficient documentation

## 2013-08-01 DIAGNOSIS — T8489XA Other specified complication of internal orthopedic prosthetic devices, implants and grafts, initial encounter: Secondary | ICD-10-CM | POA: Insufficient documentation

## 2013-08-01 DIAGNOSIS — Z888 Allergy status to other drugs, medicaments and biological substances status: Secondary | ICD-10-CM | POA: Insufficient documentation

## 2013-08-01 DIAGNOSIS — T8484XA Pain due to internal orthopedic prosthetic devices, implants and grafts, initial encounter: Secondary | ICD-10-CM

## 2013-08-01 DIAGNOSIS — G709 Myoneural disorder, unspecified: Secondary | ICD-10-CM | POA: Insufficient documentation

## 2013-08-01 DIAGNOSIS — Y929 Unspecified place or not applicable: Secondary | ICD-10-CM | POA: Insufficient documentation

## 2013-08-01 DIAGNOSIS — F3289 Other specified depressive episodes: Secondary | ICD-10-CM | POA: Insufficient documentation

## 2013-08-01 DIAGNOSIS — K219 Gastro-esophageal reflux disease without esophagitis: Secondary | ICD-10-CM | POA: Insufficient documentation

## 2013-08-01 DIAGNOSIS — G2581 Restless legs syndrome: Secondary | ICD-10-CM | POA: Insufficient documentation

## 2013-08-01 DIAGNOSIS — F17200 Nicotine dependence, unspecified, uncomplicated: Secondary | ICD-10-CM | POA: Insufficient documentation

## 2013-08-01 DIAGNOSIS — Y831 Surgical operation with implant of artificial internal device as the cause of abnormal reaction of the patient, or of later complication, without mention of misadventure at the time of the procedure: Secondary | ICD-10-CM | POA: Insufficient documentation

## 2013-08-01 DIAGNOSIS — J449 Chronic obstructive pulmonary disease, unspecified: Secondary | ICD-10-CM | POA: Insufficient documentation

## 2013-08-01 DIAGNOSIS — F329 Major depressive disorder, single episode, unspecified: Secondary | ICD-10-CM | POA: Insufficient documentation

## 2013-08-01 DIAGNOSIS — J4489 Other specified chronic obstructive pulmonary disease: Secondary | ICD-10-CM | POA: Insufficient documentation

## 2013-08-01 DIAGNOSIS — IMO0001 Reserved for inherently not codable concepts without codable children: Secondary | ICD-10-CM | POA: Insufficient documentation

## 2013-08-01 DIAGNOSIS — G43909 Migraine, unspecified, not intractable, without status migrainosus: Secondary | ICD-10-CM | POA: Insufficient documentation

## 2013-08-01 HISTORY — PX: HARDWARE REMOVAL: SHX979

## 2013-08-01 SURGERY — REMOVAL, HARDWARE
Anesthesia: General | Site: Knee | Laterality: Right | Wound class: Clean

## 2013-08-01 MED ORDER — ARTIFICIAL TEARS OP OINT
TOPICAL_OINTMENT | OPHTHALMIC | Status: DC | PRN
  Administered 2013-08-01: 1 via OPHTHALMIC

## 2013-08-01 MED ORDER — FENTANYL CITRATE 0.05 MG/ML IJ SOLN
INTRAMUSCULAR | Status: DC | PRN
  Administered 2013-08-01 (×4): 50 ug via INTRAVENOUS

## 2013-08-01 MED ORDER — HYDROMORPHONE HCL PF 1 MG/ML IJ SOLN
INTRAMUSCULAR | Status: AC
  Filled 2013-08-01: qty 1

## 2013-08-01 MED ORDER — PROPOFOL 10 MG/ML IV BOLUS
INTRAVENOUS | Status: DC | PRN
  Administered 2013-08-01: 180 mg via INTRAVENOUS

## 2013-08-01 MED ORDER — LACTATED RINGERS IV SOLN
INTRAVENOUS | Status: DC | PRN
  Administered 2013-08-01 (×2): via INTRAVENOUS

## 2013-08-01 MED ORDER — DEXTROSE 5 % IV SOLN
INTRAVENOUS | Status: DC | PRN
  Administered 2013-08-01: 08:00:00 via INTRAVENOUS

## 2013-08-01 MED ORDER — ONDANSETRON HCL 4 MG/2ML IJ SOLN
INTRAMUSCULAR | Status: DC | PRN
  Administered 2013-08-01: 4 mg via INTRAVENOUS

## 2013-08-01 MED ORDER — HYDROMORPHONE HCL PF 1 MG/ML IJ SOLN
0.2500 mg | INTRAMUSCULAR | Status: DC | PRN
  Administered 2013-08-01 (×2): 0.5 mg via INTRAVENOUS

## 2013-08-01 MED ORDER — 0.9 % SODIUM CHLORIDE (POUR BTL) OPTIME
TOPICAL | Status: DC | PRN
  Administered 2013-08-01: 1000 mL

## 2013-08-01 MED ORDER — OXYCODONE-ACETAMINOPHEN 5-325 MG PO TABS
ORAL_TABLET | ORAL | Status: AC
  Filled 2013-08-01: qty 1

## 2013-08-01 MED ORDER — OXYCODONE-ACETAMINOPHEN 5-325 MG PO TABS: 1.0000 | ORAL_TABLET | ORAL | Status: DC | PRN

## 2013-08-01 MED ORDER — ROCURONIUM BROMIDE 100 MG/10ML IV SOLN
INTRAVENOUS | Status: DC | PRN
  Administered 2013-08-01: 50 mg via INTRAVENOUS

## 2013-08-01 MED ORDER — BUPIVACAINE HCL 0.5 % IJ SOLN
INTRAMUSCULAR | Status: DC | PRN
  Administered 2013-08-01: 30 mL

## 2013-08-01 MED ORDER — OXYCODONE HCL 5 MG PO TABS: 5.0000 mg | ORAL_TABLET | Freq: Once | ORAL | Status: DC | PRN

## 2013-08-01 MED ORDER — MIDAZOLAM HCL 2 MG/2ML IJ SOLN: 1.0000 mg | INTRAMUSCULAR | Status: DC | PRN

## 2013-08-01 MED ORDER — LIDOCAINE HCL (CARDIAC) 20 MG/ML IV SOLN
INTRAVENOUS | Status: DC | PRN
  Administered 2013-08-01: 80 mg via INTRAVENOUS

## 2013-08-01 MED ORDER — BUPIVACAINE HCL (PF) 0.5 % IJ SOLN
INTRAMUSCULAR | Status: AC
  Filled 2013-08-01: qty 30

## 2013-08-01 MED ORDER — PROMETHAZINE HCL 25 MG/ML IJ SOLN: 6.2500 mg | INTRAMUSCULAR | Status: DC | PRN

## 2013-08-01 MED ORDER — GLYCOPYRROLATE 0.2 MG/ML IJ SOLN
INTRAMUSCULAR | Status: DC | PRN
  Administered 2013-08-01: 0.6 mg via INTRAVENOUS

## 2013-08-01 MED ORDER — ALBUTEROL SULFATE HFA 108 (90 BASE) MCG/ACT IN AERS
INHALATION_SPRAY | RESPIRATORY_TRACT | Status: DC | PRN
  Administered 2013-08-01: 4 via RESPIRATORY_TRACT

## 2013-08-01 MED ORDER — MIDAZOLAM HCL 5 MG/5ML IJ SOLN
INTRAMUSCULAR | Status: DC | PRN
  Administered 2013-08-01 (×2): 1 mg via INTRAVENOUS

## 2013-08-01 MED ORDER — OXYCODONE HCL 5 MG/5ML PO SOLN: 5.0000 mg | Freq: Once | ORAL | Status: DC | PRN

## 2013-08-01 MED ORDER — NEOSTIGMINE METHYLSULFATE 1 MG/ML IJ SOLN
INTRAMUSCULAR | Status: DC | PRN
  Administered 2013-08-01: 4 mg via INTRAVENOUS

## 2013-08-01 MED ORDER — FENTANYL CITRATE 0.05 MG/ML IJ SOLN: 50.0000 ug | Freq: Once | INTRAMUSCULAR | Status: DC

## 2013-08-01 SURGICAL SUPPLY — 62 items
BANDAGE ELASTIC 4 VELCRO ST LF (GAUZE/BANDAGES/DRESSINGS) IMPLANT
BANDAGE ELASTIC 6 VELCRO ST LF (GAUZE/BANDAGES/DRESSINGS) IMPLANT
BANDAGE ESMARK 6X9 LF (GAUZE/BANDAGES/DRESSINGS) IMPLANT
BANDAGE GAUZE ELAST BULKY 4 IN (GAUZE/BANDAGES/DRESSINGS) ×2 IMPLANT
BLADE SURG 10 STRL SS (BLADE) ×2 IMPLANT
BNDG CMPR 9X6 STRL LF SNTH (GAUZE/BANDAGES/DRESSINGS)
BNDG COHESIVE 4X5 TAN STRL (GAUZE/BANDAGES/DRESSINGS) IMPLANT
BNDG COHESIVE 6X5 TAN STRL LF (GAUZE/BANDAGES/DRESSINGS) ×2 IMPLANT
BNDG ESMARK 6X9 LF (GAUZE/BANDAGES/DRESSINGS)
CANISTER SUCTION 2500CC (MISCELLANEOUS) ×1 IMPLANT
CLOTH BEACON ORANGE TIMEOUT ST (SAFETY) ×2 IMPLANT
COVER SURGICAL LIGHT HANDLE (MISCELLANEOUS) ×2 IMPLANT
CUFF TOURNIQUET SINGLE 34IN LL (TOURNIQUET CUFF) IMPLANT
CUFF TOURNIQUET SINGLE 44IN (TOURNIQUET CUFF) IMPLANT
DRAPE C-ARM 42X72 X-RAY (DRAPES) IMPLANT
DRAPE C-ARM MINI 42X72 WSTRAPS (DRAPES) IMPLANT
DRAPE INCISE IOBAN 66X45 STRL (DRAPES) ×1 IMPLANT
DRAPE ORTHO SPLIT 77X108 STRL (DRAPES)
DRAPE SURG ORHT 6 SPLT 77X108 (DRAPES) IMPLANT
DRSG ADAPTIC 3X8 NADH LF (GAUZE/BANDAGES/DRESSINGS) ×1 IMPLANT
DRSG EMULSION OIL 3X3 NADH (GAUZE/BANDAGES/DRESSINGS) ×1 IMPLANT
DRSG PAD ABDOMINAL 8X10 ST (GAUZE/BANDAGES/DRESSINGS) ×2 IMPLANT
ELECT REM PT RETURN 9FT ADLT (ELECTROSURGICAL) ×2
ELECTRODE REM PT RTRN 9FT ADLT (ELECTROSURGICAL) ×1 IMPLANT
GLOVE BIOGEL PI IND STRL 7.0 (GLOVE) IMPLANT
GLOVE BIOGEL PI IND STRL 7.5 (GLOVE) IMPLANT
GLOVE BIOGEL PI IND STRL 9 (GLOVE) ×1 IMPLANT
GLOVE BIOGEL PI INDICATOR 7.0 (GLOVE) ×1
GLOVE BIOGEL PI INDICATOR 7.5 (GLOVE) ×1
GLOVE BIOGEL PI INDICATOR 9 (GLOVE) ×1
GLOVE ECLIPSE 6.5 STRL STRAW (GLOVE) ×1 IMPLANT
GLOVE SURG ORTHO 9.0 STRL STRW (GLOVE) ×2 IMPLANT
GLOVE SURG SS PI 6.5 STRL IVOR (GLOVE) ×1 IMPLANT
GOWN BRE IMP SLV AUR XL STRL (GOWN DISPOSABLE) ×1 IMPLANT
GOWN PREVENTION PLUS XLARGE (GOWN DISPOSABLE) ×2 IMPLANT
GOWN SRG XL XLNG 56XLVL 4 (GOWN DISPOSABLE) ×2 IMPLANT
GOWN STRL NON-REIN XL XLG LVL4 (GOWN DISPOSABLE) ×6
KIT BASIN OR (CUSTOM PROCEDURE TRAY) ×2 IMPLANT
KIT ROOM TURNOVER OR (KITS) ×2 IMPLANT
MANIFOLD NEPTUNE II (INSTRUMENTS) ×1 IMPLANT
NEEDLE 22X1 1/2 (OR ONLY) (NEEDLE) ×1 IMPLANT
NS IRRIG 1000ML POUR BTL (IV SOLUTION) ×2 IMPLANT
PACK ORTHO EXTREMITY (CUSTOM PROCEDURE TRAY) ×2 IMPLANT
PAD ARMBOARD 7.5X6 YLW CONV (MISCELLANEOUS) ×3 IMPLANT
PAD CAST 4YDX4 CTTN HI CHSV (CAST SUPPLIES) ×1 IMPLANT
PADDING CAST COTTON 4X4 STRL (CAST SUPPLIES)
SPONGE GAUZE 4X4 12PLY (GAUZE/BANDAGES/DRESSINGS) ×2 IMPLANT
SPONGE LAP 18X18 X RAY DECT (DISPOSABLE) ×2 IMPLANT
STAPLER VISISTAT 35W (STAPLE) ×1 IMPLANT
STOCKINETTE IMPERVIOUS 9X36 MD (GAUZE/BANDAGES/DRESSINGS) IMPLANT
STOCKINETTE IMPERVIOUS LG (DRAPES) ×1 IMPLANT
SUCTION FRAZIER TIP 10 FR DISP (SUCTIONS) ×1 IMPLANT
SUT ETHILON 2 0 PSLX (SUTURE) ×1 IMPLANT
SUT VIC AB 0 CT1 27 (SUTURE) ×2
SUT VIC AB 0 CT1 27XBRD ANBCTR (SUTURE) IMPLANT
SUT VIC AB 2-0 CT1 27 (SUTURE) ×2
SUT VIC AB 2-0 CT1 TAPERPNT 27 (SUTURE) IMPLANT
SYR CONTROL 10ML LL (SYRINGE) ×1 IMPLANT
TOWEL OR 17X24 6PK STRL BLUE (TOWEL DISPOSABLE) ×2 IMPLANT
TOWEL OR 17X26 10 PK STRL BLUE (TOWEL DISPOSABLE) ×2 IMPLANT
TUBE CONNECTING 12X1/4 (SUCTIONS) ×1 IMPLANT
WATER STERILE IRR 1000ML POUR (IV SOLUTION) ×1 IMPLANT

## 2013-08-01 NOTE — Op Note (Signed)
OPERATIVE REPORT  DATE OF SURGERY: 08/01/2013  PATIENT:  [name] Mcdonald,  42 y.o. male  PRE-OPERATIVE DIAGNOSIS:  Deep Retained Hardware Right Knee  POST-OPERATIVE DIAGNOSIS:  Deep Retained Hardware Right Knee  PROCEDURE:  Procedure(s): HARDWARE REMOVAL, right tibial plateau Medial and lateral with 2 separate incisions   SURGEON:  Surgeon(s): Nadara Mustard, MD  ANESTHESIA:   general  EBL:  min ML  SPECIMEN:  No Specimen  TOURNIQUET:  * No tourniquets in log *  PROCEDURE DETAILS: Patient is a 42 year old gentleman status post open reduction internal fixation for a bicondylar tibial plateau fracture. Patient complains of chronic pain from the retained hardware. He has failed conservative care presents at this time for surgical intervention. Risk and benefits were discussed including infection neurovascular injury nonhealing of the wounds risk for persistent pain need for total knee replacement. Patient states he understands was pursued this time. Prescription of procedure patient brought to the operating room underwent general anesthetic. After adequate levels and anesthesia were obtained patient's right lower extremity was prepped using DuraPrep and draped into a sterile field Ioban was used to cover all exposed skin. A medial incision was made over the medial plate this was carried sharply down to the plate the screws and plate were removed without complications. Attention was then focused on the lateral tibial plateau plate. A incision was made over the lateral plate this was carried sharply down to the plate the screws and plate were removed without complications. The wounds were irrigated with normal saline. The wounds were closed using 2-0 Vicryl and staples. The wound was covered with Adaptic orthopedic sponges AB dressing Kerlix and Coban. Patient was extubated taken to the PACU in stable condition.  PLAN OF CARE: Discharge to home after PACU  PATIENT DISPOSITION:  PACU -  hemodynamically stable.   Nadara Mustard, MD 08/01/2013 9:24 AM

## 2013-08-01 NOTE — Transfer of Care (Signed)
Immediate Anesthesia Transfer of Care Note  Patient: [name] Mcdonald  Procedure(s) Performed: Procedure(s) with comments: HARDWARE REMOVAL, right tibial plateau (Right) - Removal Deep Retained Hardware Right Knee  Patient Location: PACU  Anesthesia Type:General  Level of Consciousness: awake, alert , oriented and patient cooperative  Airway & Oxygen Therapy: Patient Spontanous Breathing and Patient connected to nasal cannula oxygen  Post-op Assessment: Report given to PACU RN, Post -op Vital signs reviewed and stable and Patient moving all extremities  Post vital signs: Reviewed and stable  Complications: No apparent anesthesia complications

## 2013-08-01 NOTE — Progress Notes (Signed)
Orthopedic Tech Progress Note Patient Details:  [name] Mcdonald Jan 24, 1971 [id number]  Ortho Devices Type of Ortho Device: Crutches Ortho Device/Splint Interventions: Application   Shawnie Pons 08/01/2013, 10:51 AM

## 2013-08-01 NOTE — Progress Notes (Signed)
Attempted to call several family members, multiple times. No one is able to come pick up pt. Pt has a family member here at the hospital, but she is unable to drive, and does not have money for a cab. Case manager notified. Cab voucher requested.

## 2013-08-01 NOTE — Anesthesia Preprocedure Evaluation (Addendum)
Anesthesia Evaluation  Patient identified by MRN, date of birth, ID band Patient awake    Reviewed: Allergy & Precautions, H&P , NPO status , Patient's Chart, lab work & pertinent test results, reviewed documented beta blocker date and time   Airway Mallampati: I TM Distance: >3 FB Neck ROM: Full    Dental  (+) Missing and Dental Advisory Given   Pulmonary shortness of breath, COPD COPD inhaler, Current Smoker,  1/2 ppd cigs + rhonchi         Cardiovascular Rhythm:Regular Rate:Normal     Neuro/Psych  Headaches, Seizures -,  Anxiety Depression  Neuromuscular disease    GI/Hepatic GERD-  Controlled and Medicated,  Endo/Other  Hyperthyroidism   Renal/GU      Musculoskeletal  (+) Fibromyalgia -  Abdominal   Peds  Hematology   Anesthesia Other Findings No meds today  Reproductive/Obstetrics                          Anesthesia Physical Anesthesia Plan  ASA: III  Anesthesia Plan: General   Post-op Pain Management:    Induction: Intravenous  Airway Management Planned: Oral ETT  Additional Equipment:   Intra-op Plan:   Post-operative Plan: Extubation in OR  Informed Consent:   Plan Discussed with: CRNA and Surgeon  Anesthesia Plan Comments:         Anesthesia Quick Evaluation

## 2013-08-01 NOTE — Anesthesia Procedure Notes (Signed)
Procedure Name: Intubation Date/Time: 08/01/2013 8:31 AM Performed by: Marni Griffon Pre-anesthesia Checklist: Patient identified, Emergency Drugs available, Suction available and Patient being monitored Patient Re-evaluated:Patient Re-evaluated prior to inductionOxygen Delivery Method: Circle system utilized Preoxygenation: Pre-oxygenation with 100% oxygen Intubation Type: IV induction Ventilation: Mask ventilation without difficulty Laryngoscope Size: Mac and 3 Grade View: Grade I Tube type: Oral Tube size: 7.5 mm Number of attempts: 1 Airway Equipment and Method: Stylet Placement Confirmation: ETT inserted through vocal cords under direct vision,  breath sounds checked- equal and bilateral and positive ETCO2 Secured at: 21 (cm at teeth) cm Tube secured with: Tape Dental Injury: Teeth and Oropharynx as per pre-operative assessment

## 2013-08-01 NOTE — Preoperative (Signed)
Beta Blockers   Reason not to administer Beta Blockers:Not Applicable 

## 2013-08-01 NOTE — Anesthesia Postprocedure Evaluation (Signed)
  Anesthesia Post-op Note  Patient: [name] Mcdonald  Procedure(s) Performed: Procedure(s) with comments: HARDWARE REMOVAL, right tibial plateau (Right) - Removal Deep Retained Hardware Right Knee  Patient Location: PACU  Anesthesia Type:General  Level of Consciousness: awake and alert   Airway and Oxygen Therapy: Patient Spontanous Breathing  Post-op Pain: mild  Post-op Assessment: Post-op Vital signs reviewed, Patient's Cardiovascular Status Stable, Respiratory Function Stable, Patent Airway, No signs of Nausea or vomiting, Adequate PO intake and Pain level controlled  Post-op Vital Signs: stable  Complications: No apparent anesthesia complications

## 2013-08-01 NOTE — H&P (Signed)
[name] Mcdonald is an 42 y.o. male.   Chief Complaint: Painful deep retained hardware right knee HPI: Patient is a 43 year old gentleman who complains of pain from the deep retained hardware right knee. Hardware secondary to open reduction internal fixation for tibial plateau fracture.  Past Medical History  Diagnosis Date  . Depression   . GERD (gastroesophageal reflux disease)   . Migraine   . Ulcer     hx of stomach ulcer  . History of meningitis 2000  . 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  . Seizure     has had recent seizures ,off medication  . Shortness of breath     uses inhalers ,with exertion  . Panic attack   . Restless leg syndrome   . Fibromyalgia     Past Surgical History  Procedure Laterality Date  . Brain surgery  2000    Removed growth?  . Orif tibia plateau  12/13/2011    Procedure: OPEN REDUCTION INTERNAL FIXATION (ORIF) TIBIAL PLATEAU;  Surgeon: Shelda Pal;  Location: [facility];  Service: Orthopedics;  Laterality: Right;  . Mastoidectomy Right 1/1/ 2000  . Arthroscopic knee      Family History  Problem Relation Age of Onset  . Arthritis Mother   . COPD Mother   . Emphysema Mother   . Heart disease Mother   . Depression Father   . Cirrhosis Father   . Diabetes Maternal Aunt   . Diabetes Maternal Uncle    Social History:  reports that he has been smoking Cigarettes.  He has a 7.5 pack-year smoking history. He has never used smokeless tobacco. He reports that he does not drink alcohol or use illicit drugs.  Allergies:  Allergies  Allergen Reactions  . Bee Venom Anaphylaxis  . Paroxetine Hcl Other (See Comments)    Reaction: jittery, shaky  . Phenobarbital Other (See Comments)    Reaction: overdose, unable to take anymore  . Phenytoin Hives, Itching and Swelling  . Tegretol [Carbamazepine] Other (See Comments)    Reation: extreme dizziness  . Topamax Other (See Comments)    Reaction:Vertigo, lack of focus.  .  Prednisone Hives    Reaction: also causes Sores on feet    No prescriptions prior to admission    No results found for this or any previous visit (from the past 48 hour(s)). No results found.  Review of Systems  All other systems reviewed and are negative.    There were no vitals taken for this visit. Physical Exam  On examination patient has global tenderness to palpation around the right knee. Radiograph shows congruent joint space. Assessment/Plan Assessment: Painful deep retained hardware right knee.  Plan: We'll plan for removal of deep retained hardware risks and benefits were discussed including infection neurovascular injury failure to remove some the retained hardware due to broken hardware risk of potential persistent pain. Patient states he understands was to proceed at this time.  [name] Mcdonald V 08/01/2013, 6:34 AM

## 2013-08-01 NOTE — Progress Notes (Signed)
Pt has cab voucher. [employer] Has been notified. Pt transported to [facility] entrance via wheel chair.

## 2013-08-02 ENCOUNTER — Encounter (HOSPITAL_COMMUNITY): Payer: Self-pay | Admitting: Orthopedic Surgery

## 2013-08-13 ENCOUNTER — Emergency Department (HOSPITAL_COMMUNITY): Payer: Medicare Other

## 2013-08-13 ENCOUNTER — Emergency Department (HOSPITAL_COMMUNITY)
Admission: EM | Admit: 2013-08-13 | Discharge: 2013-08-13 | Disposition: A | Discharge status: Home / Self Care | Payer: Medicare Other | Attending: Emergency Medicine | Admitting: Emergency Medicine

## 2013-08-13 ENCOUNTER — Encounter (HOSPITAL_COMMUNITY): Payer: Self-pay

## 2013-08-13 DIAGNOSIS — Z79899 Other long term (current) drug therapy: Secondary | ICD-10-CM | POA: Insufficient documentation

## 2013-08-13 DIAGNOSIS — Z888 Allergy status to other drugs, medicaments and biological substances status: Secondary | ICD-10-CM | POA: Insufficient documentation

## 2013-08-13 DIAGNOSIS — R569 Unspecified convulsions: Secondary | ICD-10-CM | POA: Insufficient documentation

## 2013-08-13 DIAGNOSIS — IMO0001 Reserved for inherently not codable concepts without codable children: Secondary | ICD-10-CM | POA: Insufficient documentation

## 2013-08-13 DIAGNOSIS — Z9889 Other specified postprocedural states: Secondary | ICD-10-CM | POA: Insufficient documentation

## 2013-08-13 DIAGNOSIS — F329 Major depressive disorder, single episode, unspecified: Secondary | ICD-10-CM | POA: Insufficient documentation

## 2013-08-13 DIAGNOSIS — G8918 Other acute postprocedural pain: Secondary | ICD-10-CM | POA: Insufficient documentation

## 2013-08-13 DIAGNOSIS — Z8661 Personal history of infections of the central nervous system: Secondary | ICD-10-CM | POA: Insufficient documentation

## 2013-08-13 DIAGNOSIS — Z8669 Personal history of other diseases of the nervous system and sense organs: Secondary | ICD-10-CM | POA: Insufficient documentation

## 2013-08-13 DIAGNOSIS — F3289 Other specified depressive episodes: Secondary | ICD-10-CM | POA: Insufficient documentation

## 2013-08-13 DIAGNOSIS — F410 Panic disorder [episodic paroxysmal anxiety] without agoraphobia: Secondary | ICD-10-CM | POA: Insufficient documentation

## 2013-08-13 DIAGNOSIS — T8131XA Disruption of external operation (surgical) wound, not elsewhere classified, initial encounter: Secondary | ICD-10-CM | POA: Insufficient documentation

## 2013-08-13 DIAGNOSIS — M25569 Pain in unspecified knee: Secondary | ICD-10-CM | POA: Insufficient documentation

## 2013-08-13 DIAGNOSIS — Z8719 Personal history of other diseases of the digestive system: Secondary | ICD-10-CM | POA: Insufficient documentation

## 2013-08-13 DIAGNOSIS — G2581 Restless legs syndrome: Secondary | ICD-10-CM | POA: Insufficient documentation

## 2013-08-13 DIAGNOSIS — M25561 Pain in right knee: Secondary | ICD-10-CM

## 2013-08-13 DIAGNOSIS — K219 Gastro-esophageal reflux disease without esophagitis: Secondary | ICD-10-CM | POA: Insufficient documentation

## 2013-08-13 DIAGNOSIS — Y839 Surgical procedure, unspecified as the cause of abnormal reaction of the patient, or of later complication, without mention of misadventure at the time of the procedure: Secondary | ICD-10-CM | POA: Insufficient documentation

## 2013-08-13 DIAGNOSIS — F17200 Nicotine dependence, unspecified, uncomplicated: Secondary | ICD-10-CM | POA: Insufficient documentation

## 2013-08-13 LAB — BASIC METABOLIC PANEL
BUN: 10 mg/dL (ref 6–23)
CO2: 25 mEq/L (ref 19–32)
Chloride: 102 mEq/L (ref 96–112)
GFR calc non Af Amer: >90 mL/min (ref >90)

## 2013-08-13 LAB — CBC WITH DIFFERENTIAL/PLATELET
Basophils Relative: 1 % (ref 0–1)
Eosinophils Relative: 4 % (ref 0–5)
HCT: 43.5 % (ref 39.0–52.0)
Hemoglobin: 15 g/dL (ref 13.0–17.0)
MCHC: 34.5 g/dL (ref 30.0–36.0)
MCV: 93.3 fL (ref 78.0–100.0)
Monocytes Absolute: 0.6 10*3/uL (ref 0.1–1.0)
Monocytes Relative: 6 % (ref 3–12)
Neutro Abs: 5.5 10*3/uL (ref 1.7–7.7)

## 2013-08-13 LAB — C-REACTIVE PROTEIN: CRP: 2.7 mg/dL — ABNORMAL HIGH (ref <0.60)

## 2013-08-13 LAB — SEDIMENTATION RATE: Sed Rate: 4 mm/hr (ref 0–16)

## 2013-08-13 MED ORDER — OXYCODONE-ACETAMINOPHEN 5-325 MG PO TABS: 2.0000 | ORAL_TABLET | ORAL | Status: DC | PRN

## 2013-08-13 MED ORDER — OXYCODONE-ACETAMINOPHEN 5-325 MG PO TABS
2.0000 | ORAL_TABLET | Freq: Once | ORAL | Status: AC
  Administered 2013-08-13: 2 via ORAL
  Filled 2013-08-13: qty 2

## 2013-08-13 NOTE — ED Provider Notes (Signed)
CSN: [id number]     Arrival date & time 08/13/13  1348 History   First MD Initiated Contact with Patient 08/13/13 1547     Chief Complaint  Patient presents with  . Knee Pain   (Consider location/radiation/quality/duration/timing/severity/associated sxs/prior Treatment) HPI Comments: Patient complains of pain in his right knee for the past several days. He had surgery or Dr. due to for hardware removal August 20. He states he had constant pain since that time and is now out but no purulence. He denies any focal weakness, numbness or tingling. Denies any fevers. He is now out of his pain medications. The wound been draining some serosanguineous material. He denies any fever or vomiting.   The history is provided by the patient.    Past Medical History  Diagnosis Date  . Depression   . GERD (gastroesophageal reflux disease)   . Migraine   . Ulcer     hx of stomach ulcer  . History of meningitis 2000  . 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  . Seizure     has had recent seizures ,off medication  . Shortness of breath     uses inhalers ,with exertion  . Panic attack   . Restless leg syndrome   . Fibromyalgia    Past Surgical History  Procedure Laterality Date  . Brain surgery  2000    Removed growth?  . Orif tibia plateau  12/13/2011    Procedure: OPEN REDUCTION INTERNAL FIXATION (ORIF) TIBIAL PLATEAU;  Surgeon: Shelda Pal;  Location: [facility];  Service: Orthopedics;  Laterality: Right;  . Mastoidectomy Right 1/1/ 2000  . Arthroscopic knee    . Hardware removal Right 08/01/2013    Procedure: HARDWARE REMOVAL, right tibial plateau;  Surgeon: Nadara Mustard, MD;  Location: [facility] OR;  Service: Orthopedics;  Laterality: Right;  Removal Deep Retained Hardware Right Knee   Family History  Problem Relation Age of Onset  . Arthritis Mother   . COPD Mother   . Emphysema Mother   . Heart disease Mother   . Depression Father   . Cirrhosis Father   .  Diabetes Maternal Aunt   . Diabetes Maternal Uncle    History  Substance Use Topics  . Smoking status: Current Every Day Smoker -- 0.50 packs/day for 15 years    Types: Cigarettes  . Smokeless tobacco: Never Used  . Alcohol Use: No    Review of Systems  Constitutional: Negative for activity change and appetite change.  HENT: Negative for congestion and rhinorrhea.   Respiratory: Negative for cough, chest tightness and shortness of breath.   Cardiovascular: Negative for chest pain.  Gastrointestinal: Negative for nausea, vomiting and abdominal pain.  Genitourinary: Negative for dysuria, urgency and hematuria.  Musculoskeletal: Positive for myalgias and arthralgias.  Skin: Positive for wound.  Neurological: Negative for dizziness, weakness and headaches.  A complete 10 system review of systems was obtained and all systems are negative except as noted in the HPI and PMH.    Allergies  Bee venom; Paroxetine hcl; Phenobarbital; Phenytoin; Tegretol; Topamax; and Prednisone  Home Medications   Current Outpatient Rx  Name  Route  Sig  Dispense  Refill  . albuterol (PROVENTIL HFA;VENTOLIN HFA) 108 (90 BASE) MCG/ACT inhaler   Inhalation   Inhale 2 puffs into the lungs every 6 (six) hours as needed for wheezing.         [name] amphetamine-dextroamphetamine (ADDERALL) 20 MG tablet   Oral  Take 20 mg by mouth daily with breakfast.         . Aspirin-Salicylamide-Caffeine (BC HEADACHE POWDER PO)   Oral   Take 1 packet by mouth every 6 (six) hours as needed (for pain).          . cholecalciferol (VITAMIN D) 1000 UNITS tablet   Oral   Take 1,000 Units by mouth daily.         . clonazePAM (KLONOPIN) 2 MG tablet   Oral   Take 2 mg by mouth 3 (three) times daily.         . DULoxetine (CYMBALTA) 60 MG capsule   Oral   Take 60 mg by mouth daily.         . fish oil-omega-3 fatty acids 1000 MG capsule   Oral   Take 1 g by mouth daily.          [name] oxycodone (ROXICODONE) 30  MG immediate release tablet   Oral   Take 30 mg by mouth every 8 (eight) hours as needed for pain.         [name] oxyCODONE-acetaminophen (ROXICET) 5-325 MG per tablet   Oral   Take 1 tablet by mouth every 4 (four) hours as needed for pain.   60 tablet   0   . pregabalin (LYRICA) 300 MG capsule   Oral   Take 300 mg by mouth 2 (two) times daily.         . ranitidine (ZANTAC) 300 MG tablet   Oral   Take 300 mg by mouth 3 (three) times daily as needed for heartburn.         [name] tiZANidine (ZANAFLEX) 4 MG tablet   Oral   Take 4 mg by mouth 3 (three) times daily.         . traMADol (ULTRAM) 50 MG tablet   Oral   Take 50-100 mg by mouth every 6 (six) hours as needed for pain.         . vitamin B-12 (CYANOCOBALAMIN) 1000 MCG tablet   Oral   Take 2,000 mcg by mouth daily.         [name] oxyCODONE-acetaminophen (PERCOCET/ROXICET) 5-325 MG per tablet   Oral   Take 2 tablets by mouth every 4 (four) hours as needed for pain.   15 tablet   0   . oxyCODONE-acetaminophen (PERCOCET/ROXICET) 5-325 MG per tablet   Oral   Take 2 tablets by mouth every 4 (four) hours as needed for pain.   15 tablet   0    BP 122/84  Pulse 87  Temp(Src) 99.1 F (37.3 C) (Oral)  Resp 20  SpO2 97% Physical Exam  Constitutional: He is oriented to person, place, and time. He appears well-developed and well-nourished. No distress.  HENT:  Head: Normocephalic and atraumatic.  Mouth/Throat: Oropharynx is clear and moist. No oropharyngeal exudate.  Eyes: Conjunctivae and EOM are normal. Pupils are equal, round, and reactive to light.  Neck: Normal range of motion. Neck supple.  Cardiovascular: Normal rate, regular rhythm and normal heart sounds.   No murmur heard. Pulmonary/Chest: Breath sounds normal. No respiratory distress.  Abdominal: Soft. There is no tenderness. There is no rebound and no guarding.  Musculoskeletal: He exhibits tenderness. He exhibits no edema.  Patient's right knee shows no  erythema or effusion. Surgical staples intact on the lateral and medial joint lines. There is slight erythema around each staple prong. There is a small area of dehiscence on the medial  aspect of the knee with serosanguineous drainage. there is no fluctuance  Range of motion is reduced secondary to pain  Neurological: He is alert and oriented to person, place, and time. No cranial nerve deficit. He exhibits normal muscle tone. Coordination normal.  Skin: Skin is warm.    ED Course  Procedures (including critical care time) Labs Review Labs Reviewed  CBC WITH DIFFERENTIAL - Abnormal; Notable for the following:    WBC 11.1 (*)    Lymphs Abs 4.5 (*)    All other components within normal limits  BASIC METABOLIC PANEL  SEDIMENTATION RATE  C-REACTIVE PROTEIN   Imaging Review Dg Knee Complete 4 Views Right  08/13/2013   *RADIOLOGY REPORT*  Clinical Data: Knee pain, removal of hardware  RIGHT KNEE - COMPLETE 4+ VIEW  Comparison: 05/31/2013  Findings: Previous proximal tibial plateau hardware has been removed bilaterally.  Staples noted on both sides.  Mild soft tissue swelling.  No definite displaced fracture, malalignment, or effusion.  IMPRESSION: Interval hardware removal.  No acute osseous finding.   Original Report Authenticated By: Judie Petit. Miles Costain, M.D.    MDM   1. Postoperative pain, acute, knee, right    Knee pain after surgery on August 20, some serosanguinous drainage without erythema or fluctuance.   X-ray negative. There is no effusion or erythema on exam. Joint does not appear to be septic. He is able to range joint without significant pain. White count 11. ESR negative.  Case discussed with Dr. Roda Shutters of orthopedics. Patient has followup with Dr. Lajoyce Corners tomorrow. Dr. Roda Shutters agrees that this appears to be a pain management issue. There is no evidence of acute wound infection or septic joint. We'll refill patient's pain medication and he is stable for followup with his orthopedist  tomorrow.  Glynn Octave, MD 08/13/13 [phone]

## 2013-08-13 NOTE — ED Notes (Signed)
Pt c/o pain at surgical site on right knee.  St's he is out of his pain meds and area has been draining.

## 2013-08-13 NOTE — ED Notes (Signed)
Pt returned from xray

## 2013-08-13 NOTE — ED Notes (Signed)
Pt had recent knee surgery, complains of redness and possible infection to same knee. Out of meds and sts he cant tolerate pain anymore.

## 2013-08-17 ENCOUNTER — Encounter (HOSPITAL_COMMUNITY): Payer: Self-pay

## 2013-08-17 ENCOUNTER — Emergency Department (HOSPITAL_COMMUNITY)
Admission: EM | Admit: 2013-08-17 | Discharge: 2013-08-17 | Disposition: A | Discharge status: Home / Self Care | Payer: Medicare Other | Attending: Emergency Medicine | Admitting: Emergency Medicine

## 2013-08-17 DIAGNOSIS — Z9889 Other specified postprocedural states: Secondary | ICD-10-CM | POA: Insufficient documentation

## 2013-08-17 DIAGNOSIS — Z8719 Personal history of other diseases of the digestive system: Secondary | ICD-10-CM | POA: Insufficient documentation

## 2013-08-17 DIAGNOSIS — F17200 Nicotine dependence, unspecified, uncomplicated: Secondary | ICD-10-CM | POA: Insufficient documentation

## 2013-08-17 DIAGNOSIS — M25561 Pain in right knee: Secondary | ICD-10-CM

## 2013-08-17 DIAGNOSIS — Y839 Surgical procedure, unspecified as the cause of abnormal reaction of the patient, or of later complication, without mention of misadventure at the time of the procedure: Secondary | ICD-10-CM | POA: Insufficient documentation

## 2013-08-17 DIAGNOSIS — M25569 Pain in unspecified knee: Secondary | ICD-10-CM | POA: Insufficient documentation

## 2013-08-17 DIAGNOSIS — F329 Major depressive disorder, single episode, unspecified: Secondary | ICD-10-CM | POA: Insufficient documentation

## 2013-08-17 DIAGNOSIS — K219 Gastro-esophageal reflux disease without esophagitis: Secondary | ICD-10-CM | POA: Insufficient documentation

## 2013-08-17 DIAGNOSIS — R569 Unspecified convulsions: Secondary | ICD-10-CM | POA: Insufficient documentation

## 2013-08-17 DIAGNOSIS — R112 Nausea with vomiting, unspecified: Secondary | ICD-10-CM | POA: Insufficient documentation

## 2013-08-17 DIAGNOSIS — Z8669 Personal history of other diseases of the nervous system and sense organs: Secondary | ICD-10-CM | POA: Insufficient documentation

## 2013-08-17 DIAGNOSIS — R209 Unspecified disturbances of skin sensation: Secondary | ICD-10-CM | POA: Insufficient documentation

## 2013-08-17 DIAGNOSIS — R509 Fever, unspecified: Secondary | ICD-10-CM | POA: Insufficient documentation

## 2013-08-17 DIAGNOSIS — Z79899 Other long term (current) drug therapy: Secondary | ICD-10-CM | POA: Insufficient documentation

## 2013-08-17 DIAGNOSIS — F410 Panic disorder [episodic paroxysmal anxiety] without agoraphobia: Secondary | ICD-10-CM | POA: Insufficient documentation

## 2013-08-17 DIAGNOSIS — IMO0001 Reserved for inherently not codable concepts without codable children: Secondary | ICD-10-CM | POA: Insufficient documentation

## 2013-08-17 DIAGNOSIS — G2581 Restless legs syndrome: Secondary | ICD-10-CM | POA: Insufficient documentation

## 2013-08-17 DIAGNOSIS — Z888 Allergy status to other drugs, medicaments and biological substances status: Secondary | ICD-10-CM | POA: Insufficient documentation

## 2013-08-17 DIAGNOSIS — F3289 Other specified depressive episodes: Secondary | ICD-10-CM | POA: Insufficient documentation

## 2013-08-17 DIAGNOSIS — Z8661 Personal history of infections of the central nervous system: Secondary | ICD-10-CM | POA: Insufficient documentation

## 2013-08-17 DIAGNOSIS — T8131XA Disruption of external operation (surgical) wound, not elsewhere classified, initial encounter: Secondary | ICD-10-CM | POA: Insufficient documentation

## 2013-08-17 DIAGNOSIS — R51 Headache: Secondary | ICD-10-CM | POA: Insufficient documentation

## 2013-08-17 DIAGNOSIS — T8131XD Disruption of external operation (surgical) wound, not elsewhere classified, subsequent encounter: Secondary | ICD-10-CM

## 2013-08-17 MED ORDER — OXYCODONE-ACETAMINOPHEN 5-325 MG PO TABS
1.0000 | ORAL_TABLET | Freq: Once | ORAL | Status: AC
  Administered 2013-08-17: 1 via ORAL
  Filled 2013-08-17: qty 1

## 2013-08-17 MED ORDER — OXYCODONE-ACETAMINOPHEN 5-325 MG PO TABS: 1.0000 | ORAL_TABLET | Freq: Three times a day (TID) | ORAL | Status: DC | PRN

## 2013-08-17 NOTE — ED Provider Notes (Signed)
Patient with wound dehiscence at surgical wound at right knee. Wound dehiscence approximately 1 cm in length. There is a small amount of serous-appearing coming from the wound. Wound is not red or warm  Doug Sou, MD 08/17/13 [phone]

## 2013-08-17 NOTE — ED Notes (Addendum)
Pt reports having a surgical procedure to his Right knee 3 weeks ago, orthopedic removed sutures last week, pt reports increase pain today and a "whole" developing in his knee where the sutures were removed yesterday. Pt was unable to obtain transportation to [facility] today as instructed by them.  Pt also reports he has been out of his seizure medication x2 days, states he is no longer seeing the same pcp for his seizure disorder.  pt reports he is unable to continue taking the Tramadol pain medication prescribed by orthopedic surgeon, states "it makes me sick to my stomach even after eating."

## 2013-08-17 NOTE — ED Provider Notes (Signed)
CSN: [id number]     Arrival date & time 08/17/13  1540 History   First MD Initiated Contact with Patient 08/17/13 1557     Chief Complaint  Patient presents with  . Knee Pain   (Consider location/radiation/quality/duration/timing/severity/associated sxs/prior Treatment) The history is provided by the patient. No language interpreter was used.  [name] Mcdonald is a 42 year old male with past medical history depression, GERD, migraine, seizure, shortness of breath presenting to emergency department with right knee pain that has been ongoing for the past week. Patient recently had surgery regarding hardware removal on 08/01/2013 with Dr. Jonathon Bellows of the right knee from previous ORIF tibial plateau surgery in 2012 of the right knee. Patient was recently seen in urgent care Center today, 08/17/2013 was recently seen in the emergency department on the first 2014 regarding right knee pain. Patient presenting today with right knee discomfort that has been ongoing for the past couple of weeks, described as a constant dull pain with radiation from the right hip all the way down to the right ankle. Patient reported that he has intermittent numbness to the ankle and toes of the right side. Patient reports that he is keeping the wound site clean, changing the gloves every 30 minutes. Patient is concerned because he has an opening of his wound, reported that he has been having drainage of blood in clear fluids. Patient reported that he's concerned of infection. Patient reported the pain worsens when he walks, nothing makes the pain better. Patient reports he has been taking tramadol and noted that the tramadol is been making him feel rather nauseous resulting in vomiting at least 9 times a day denied bile and blood. Patient reports that he has been feeling mildly feverish with chills. Patient currently on doxycycline. Patient was recently seen by Dr. Lajoyce Corners on 08/14/2013 for removal of staples. Denied chest pain, short of  breath, abdominal pain, diarrhea, urinary and bowel incontinence, back pain, fall, injury. PCP none  Past Medical History  Diagnosis Date  . Depression   . GERD (gastroesophageal reflux disease)   . Migraine   . Ulcer     hx of stomach ulcer  . History of meningitis 2000  . 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  . Seizure     has had recent seizures ,off medication  . Shortness of breath     uses inhalers ,with exertion  . Panic attack   . Restless leg syndrome   . Fibromyalgia    Past Surgical History  Procedure Laterality Date  . Brain surgery  2000    Removed growth?  . Orif tibia plateau  12/13/2011    Procedure: OPEN REDUCTION INTERNAL FIXATION (ORIF) TIBIAL PLATEAU;  Surgeon: Shelda Pal;  Location: [facility];  Service: Orthopedics;  Laterality: Right;  . Mastoidectomy Right 1/1/ 2000  . Arthroscopic knee    . Hardware removal Right 08/01/2013    Procedure: HARDWARE REMOVAL, right tibial plateau;  Surgeon: Nadara Mustard, MD;  Location: [facility];  Service: Orthopedics;  Laterality: Right;  Removal Deep Retained Hardware Right Knee   Family History  Problem Relation Age of Onset  . Arthritis Mother   . COPD Mother   . Emphysema Mother   . Heart disease Mother   . Depression Father   . Cirrhosis Father   . Diabetes Maternal Aunt   . Diabetes Maternal Uncle    History  Substance Use Topics  . Smoking status: Current Every  Day Smoker -- 0.50 packs/day for 15 years    Types: Cigarettes  . Smokeless tobacco: Never Used  . Alcohol Use: No    Review of Systems  Constitutional: Positive for fever. Negative for chills.  HENT: Negative for trouble swallowing.   Respiratory: Negative for shortness of breath.   Cardiovascular: Negative for chest pain.  Gastrointestinal: Positive for nausea and vomiting. Negative for abdominal pain and diarrhea.  Musculoskeletal: Positive for arthralgias (Right knee). Negative for joint swelling.  Skin:  Positive for wound.  Neurological: Positive for headaches. Negative for weakness.  All other systems reviewed and are negative.    Allergies  Bee venom; Paroxetine hcl; Phenobarbital; Phenytoin; Tegretol; Topamax; and Prednisone  Home Medications   Current Outpatient Rx  Name  Route  Sig  Dispense  Refill  . albuterol (PROVENTIL HFA;VENTOLIN HFA) 108 (90 BASE) MCG/ACT inhaler   Inhalation   Inhale 2 puffs into the lungs every 6 (six) hours as needed for wheezing.         [name] amphetamine-dextroamphetamine (ADDERALL) 30 MG tablet   Oral   Take 30 mg by mouth daily.         . Aspirin-Acetaminophen-Caffeine (GOODYS EXTRA STRENGTH PO)   Oral   Take 1-2 packets by mouth every 6 (six) hours as needed (pain).         . B Complex-C (SUPER B COMPLEX PO)   Oral   Take 1 tablet by mouth daily.         . cholecalciferol (VITAMIN D) 1000 UNITS tablet   Oral   Take 1,000 Units by mouth daily.         . clonazePAM (KLONOPIN) 2 MG tablet   Oral   Take 2 mg by mouth 3 (three) times daily.         . DULoxetine (CYMBALTA) 60 MG capsule   Oral   Take 60 mg by mouth daily.         . fish oil-omega-3 fatty acids 1000 MG capsule   Oral   Take 1 g by mouth daily.          [name] oxyCODONE (OXYCONTIN) 20 MG 12 hr tablet   Oral   Take 20 mg by mouth every 12 (twelve) hours as needed for pain (for fibromyalgia).         . pregabalin (LYRICA) 300 MG capsule   Oral   Take 300 mg by mouth daily.         . ranitidine (ZANTAC) 300 MG tablet   Oral   Take 300 mg by mouth 2 (two) times daily as needed for heartburn.         [name] tiZANidine (ZANAFLEX) 4 MG tablet   Oral   Take 4 mg by mouth every 8 (eight) hours as needed (muscle relaxation and fibromyalgia).          . vitamin B-12 (CYANOCOBALAMIN) 1000 MCG tablet   Oral   Take 1,000 mcg by mouth daily.          [name] oxyCODONE-acetaminophen (PERCOCET/ROXICET) 5-325 MG per tablet   Oral   Take 1 tablet by mouth every 8  (eight) hours as needed for pain.   11 tablet   0    BP 91/56  Pulse 79  Temp(Src) 98.1 F (36.7 C) (Oral)  Resp 16  SpO2 99% Physical Exam  Nursing note and vitals reviewed. Constitutional: He is oriented to person, place, and time. He appears well-developed and well-nourished. No distress.  Patient  appears comfortable in ED setting, sitting upright while in bed  HENT:  Head: Normocephalic and atraumatic.  Eyes: Conjunctivae and EOM are normal. Pupils are equal, round, and reactive to light. Right eye exhibits no discharge. Left eye exhibits no discharge.  Neck: Normal range of motion. Neck supple.  Negative neck stiffness Negative nuchal rigidity Negative pain upon palpation to cervical spine Negative lymphadenopathy identified  Cardiovascular: Normal rate, regular rhythm and normal heart sounds.  Exam reveals no friction rub.   No murmur heard. Pulses:      Radial pulses are 2+ on the right side, and 2+ on the left side.       Dorsalis pedis pulses are 2+ on the right side, and 2+ on the left side.  Pulmonary/Chest: Effort normal and breath sounds normal.  Musculoskeletal: He exhibits tenderness.  Discomfort noted upon palpation to the right knee, circumferential and right leg, circumferential. Decreased range of motion to right knee secondary to pain.  Lymphadenopathy:    He has no cervical adenopathy.  Neurological: He is alert and oriented to person, place, and time. He exhibits normal muscle tone. Coordination normal.  Strength 5+/5+ to lower extremities bilaterally with resistance, equal distribution noted Sensation intact to upper and lower extremities bilaterally with differentiation to sharp and dull touch Patient able to walk to the bathroom without difficulty noted - proper balance   Skin: Skin is warm and dry. He is not diaphoretic.  Approximately 1 cm long by 2 mm wide wound opening of medial aspects of the right knee, with serous fluid drainage. Negative signs  of inflammation, pus drainage, erythema, surrounding cellulitic infection. Mild discomfort upon palpation noted. Wound healing well. Negative warmth upon palpation.  Psychiatric: He has a normal mood and affect. His behavior is normal. Thought content normal.    ED Course  Procedures (including critical care time)  This provider reviewed patient's chart. Patient recently seen in the emergency department on September 1 for similar complaint of right knee discomfort. CBC with negative findings. ESR negative elevation. X-ray of the right knee was performed with negative findings of acute abnormalities, negative signs of effusion identified. According to note that was recorded on 08/13/2013 there is no sign of septic joint, no sign of infection, no sinus cellulitic infection. Dr. Roda Shutters, orthopedic on call, was consulted about the case. Patient was cleared for discharge and sent home with pain medications.  6:21 PM spoke with Dr. Lajoyce Corners, patient's orthopedic surgeon, discussed case and findings with Dr. Lajoyce Corners. As per physician reported that these findings regarding the opening of the wound has been there since the patient's had this procedure. Reported that it was 1 cm long 2 mm wide and has been that way since the staples were placed. Position reported they were never red, there was never any sign of infection. As per Dr. Lajoyce Corners, cleared patient for discharge and recommended patient to keep wound clean at all times, apply antibiotic ointment, and see physician as outpatient for followup.  Labs Review Labs Reviewed - No data to display Imaging Review No results found.  MDM   1. Wound dehiscence, subsequent encounter   2. Right knee pain     Patient presenting to emergency department with ongoing right knee pain status post hardware removal performed on 08/01/2013. Patient reports that there is opening to the wounds where the incision was made from surgery, reports that there is drainage from the sites.  Patient is concerned about issue. Alert and oriented. Approximately 1 cm dehiscence of  incision noted to the medial aspect of the right knee. Negative erythema, inflammation, swelling, warmth upon palpation, cellulitic findings, pus drainage, bleeding. Serous fluid identified with drainage. Circumferential discomfort upon palpation to the right knee and leg. Sensation intact. Strength intact. Proper gait. Pulses palpable. Patient was used making emergency department on 08/13/2013 where labs were drawn, CBC negative findings, ESR negative elevation. X-ray of right knee was performed with negative findings infusion, negative signs of cellulitic identification the Patient seen and assessed by Dr. Dub Mikes. Spoke with Dr. Lajoyce Corners regarding case, physician is well aware-physician reported that this is normal for the patient, reported that this is a similar presentation that he had when patient was seen on 08/14/2013 with staple removal. Dr. Lajoyce Corners cleared patient for discharge, recommended continuous proper wound care techniques to clean, continuation of doxycycline, applied bacitracin ointment to the site, and followup with physician in the outpatient setting. Wound cleaned and dressings applied in ED setting. Pain controlled in ED setting. Patient stable, afebrile. Discharged patient. Discussed with patient to followup with Dr. Lajoyce Corners. Discussed with patient to continue to keep site clean, educated patient proper hygiene. Discharged patient with small dose of pain medications-discussed course, precautions, disposal. Discussed with patient to continue monitor symptoms and if symptoms are to worsen or change report back to emergency apartment immediately - return instructions given. Patient agreed to plan of care, understood, all questions answered.   Raymon Mutton, PA-C 08/19/13 [phone]

## 2013-08-17 NOTE — ED Notes (Addendum)
Pt had knee surgery x3 weeks ago, pt has been taking abx as prescribed and keeping wound clean and dry. Pt here today with increased pain in knee and incision opening. Incision site clean and dry; no redness, or swelling with minimal yellow/red drainage incision edges approximated center of incision dehisced. Pt sts just changed dressing prior to arrival and was "blood red" with no foul odor. Pt skin warm to touch, pedal pulses 2+.

## 2013-08-20 ENCOUNTER — Encounter (HOSPITAL_COMMUNITY): Payer: Self-pay | Admitting: Emergency Medicine

## 2013-08-20 ENCOUNTER — Emergency Department (HOSPITAL_COMMUNITY)
Admission: EM | Admit: 2013-08-20 | Discharge: 2013-08-20 | Discharge status: Against Medical Advice | Payer: Medicare Other | Attending: Emergency Medicine | Admitting: Emergency Medicine

## 2013-08-20 DIAGNOSIS — M25569 Pain in unspecified knee: Secondary | ICD-10-CM | POA: Diagnosis present

## 2013-08-20 DIAGNOSIS — F329 Major depressive disorder, single episode, unspecified: Secondary | ICD-10-CM | POA: Diagnosis not present

## 2013-08-20 DIAGNOSIS — Z9889 Other specified postprocedural states: Secondary | ICD-10-CM | POA: Insufficient documentation

## 2013-08-20 DIAGNOSIS — Z872 Personal history of diseases of the skin and subcutaneous tissue: Secondary | ICD-10-CM | POA: Diagnosis not present

## 2013-08-20 DIAGNOSIS — F3289 Other specified depressive episodes: Secondary | ICD-10-CM | POA: Insufficient documentation

## 2013-08-20 DIAGNOSIS — Z79899 Other long term (current) drug therapy: Secondary | ICD-10-CM | POA: Insufficient documentation

## 2013-08-20 DIAGNOSIS — G2581 Restless legs syndrome: Secondary | ICD-10-CM | POA: Diagnosis not present

## 2013-08-20 DIAGNOSIS — M25561 Pain in right knee: Secondary | ICD-10-CM

## 2013-08-20 DIAGNOSIS — F410 Panic disorder [episodic paroxysmal anxiety] without agoraphobia: Secondary | ICD-10-CM | POA: Diagnosis not present

## 2013-08-20 DIAGNOSIS — K219 Gastro-esophageal reflux disease without esophagitis: Secondary | ICD-10-CM | POA: Diagnosis not present

## 2013-08-20 DIAGNOSIS — Z8661 Personal history of infections of the central nervous system: Secondary | ICD-10-CM | POA: Diagnosis not present

## 2013-08-20 DIAGNOSIS — G40909 Epilepsy, unspecified, not intractable, without status epilepticus: Secondary | ICD-10-CM | POA: Diagnosis not present

## 2013-08-20 DIAGNOSIS — Z8711 Personal history of peptic ulcer disease: Secondary | ICD-10-CM | POA: Insufficient documentation

## 2013-08-20 DIAGNOSIS — G43909 Migraine, unspecified, not intractable, without status migrainosus: Secondary | ICD-10-CM | POA: Insufficient documentation

## 2013-08-20 DIAGNOSIS — R569 Unspecified convulsions: Secondary | ICD-10-CM

## 2013-08-20 DIAGNOSIS — Z8739 Personal history of other diseases of the musculoskeletal system and connective tissue: Secondary | ICD-10-CM | POA: Diagnosis not present

## 2013-08-20 LAB — CBC WITH DIFFERENTIAL/PLATELET
Basophils Absolute: 0.1 10*3/uL (ref 0.0–0.1)
Basophils Relative: 1 % (ref 0–1)
Eosinophils Relative: 8 % — ABNORMAL HIGH (ref 0–5)
HCT: 43.2 % (ref 39.0–52.0)
MCHC: 34.7 g/dL (ref 30.0–36.0)
Monocytes Absolute: 0.6 10*3/uL (ref 0.1–1.0)
Neutro Abs: 3.2 10*3/uL (ref 1.7–7.7)
Platelets: 284 10*3/uL (ref 150–400)
RDW: 13.3 % (ref 11.5–15.5)

## 2013-08-20 LAB — POCT I-STAT TROPONIN I: Troponin i, poc: 0 ng/mL (ref 0.00–0.08)

## 2013-08-20 LAB — COMPREHENSIVE METABOLIC PANEL
ALT: 13 U/L (ref 0–53)
AST: 15 U/L (ref 0–37)
Albumin: 3.6 g/dL (ref 3.5–5.2)
Calcium: 9.4 mg/dL (ref 8.4–10.5)
Chloride: 104 mEq/L (ref 96–112)
Creatinine, Ser: 0.76 mg/dL (ref 0.50–1.35)
Sodium: 137 mEq/L (ref 135–145)

## 2013-08-20 MED ORDER — KETOROLAC TROMETHAMINE 30 MG/ML IJ SOLN
30.0000 mg | Freq: Once | INTRAMUSCULAR | Status: AC
  Administered 2013-08-20: 30 mg via INTRAMUSCULAR

## 2013-08-20 MED ORDER — KETOROLAC TROMETHAMINE 30 MG/ML IJ SOLN
30.0000 mg | Freq: Once | INTRAMUSCULAR | Status: DC
  Filled 2013-08-20: qty 1

## 2013-08-20 NOTE — ED Notes (Signed)
Pt states that he had knee surgery (rt knee).  C/o continued pain since the surgery 3 wks ago.  States it is draining blood and pus. Also states hx of seizures and stated that he had a seizure earlier today.

## 2013-08-20 NOTE — ED Provider Notes (Signed)
CSN: [id number]     Arrival date & time 08/20/13  1409 History   First MD Initiated Contact with Patient 08/20/13 1516     Chief Complaint  Patient presents with  . Knee Pain  . Seizures   (Consider location/radiation/quality/duration/timing/severity/associated sxs/prior Treatment) Patient is a 42 y.o. male presenting with knee pain and seizures. The history is provided by the patient and medical records.  Knee Pain Seizures  Pt presents to the ED for right knee pain and seizure.  Pt had surgery on his right knee on 08/01/13 by Dr. Lajoyce Corners for hardware removal from prior ORIF tibial plateau repair.  Pt seen for the same on 08/17/13 for the same-- Dr. Lajoyce Corners was consulted, cleared pt for d/c and encouraged office FU.  Pt states he has had continued pain and feels that opening is getting larger.  States it has become increasingly painful for him to walk (pt is supposed to be using crutches).  Notes some intermittent paresthesias of RLE.  Denies any recent fevers, sweats, or chills.  Pt has been told numerous times to FU in office but has not done so.  States "i do not like Dr. Lajoyce Corners because of the way he made my leg look."  Pt also complains of seizure earlier this afternoon.  Pt states he was in his home earlier today trying to get to the kitchen while using his crutches when he suddenly blacked out.  Pt states his mom and step-sister who witnessed incident states he had a seizure, however they are not present in the ED to confirm.  Pt has hx of seizures, currently on Klonopin which he states he has run out of.  Denies any chest pain or SOB.  Pt AAOx3 on arrival, VS stable.   Past Medical History  Diagnosis Date  . Depression   . GERD (gastroesophageal reflux disease)   . Migraine   . Ulcer     hx of stomach ulcer  . History of meningitis 2000  . 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  . Seizure     has had recent seizures ,off medication  . Shortness of breath     uses inhalers ,with exertion  . Panic attack   . Restless leg syndrome   . Fibromyalgia    Past Surgical History  Procedure Laterality Date  . Brain surgery  2000    Removed growth?  . Orif tibia plateau  12/13/2011    Procedure: OPEN REDUCTION INTERNAL FIXATION (ORIF) TIBIAL PLATEAU;  Surgeon: Shelda Pal;  Location: [facility];  Service: Orthopedics;  Laterality: Right;  . Mastoidectomy Right 1/1/ 2000  . Arthroscopic knee    . Hardware removal Right 08/01/2013    Procedure: HARDWARE REMOVAL, right tibial plateau;  Surgeon: Nadara Mustard, MD;  Location: [facility];  Service: Orthopedics;  Laterality: Right;  Removal Deep Retained Hardware Right Knee   Family History  Problem Relation Age of Onset  . Arthritis Mother   . COPD Mother   . Emphysema Mother   . Heart disease Mother   . Depression Father   . Cirrhosis Father   . Diabetes Maternal Aunt   . Diabetes Maternal Uncle    History  Substance Use Topics  . Smoking status: Current Every Day Smoker -- 0.50 packs/day for 15 years    Types: Cigarettes  . Smokeless tobacco: Never Used  . Alcohol Use: No    Review of Systems  Musculoskeletal: Positive for  arthralgias.  Neurological: Positive for seizures.  All other systems reviewed and are negative.    Allergies  Bee venom; Paroxetine hcl; Phenobarbital; Phenytoin; Tegretol; Topamax; and Prednisone  Home Medications   Current Outpatient Rx  Name  Route  Sig  Dispense  Refill  . albuterol (PROVENTIL HFA;VENTOLIN HFA) 108 (90 BASE) MCG/ACT inhaler   Inhalation   Inhale 2 puffs into the lungs every 6 (six) hours as needed for wheezing.         [name] amphetamine-dextroamphetamine (ADDERALL) 30 MG tablet   Oral   Take 30 mg by mouth daily.         . Aspirin-Acetaminophen-Caffeine (GOODYS EXTRA STRENGTH PO)   Oral   Take 1-2 packets by mouth every 6 (six) hours as needed (pain).         . B Complex-C (SUPER B COMPLEX PO)   Oral   Take 1 tablet by mouth daily.          . cholecalciferol (VITAMIN D) 1000 UNITS tablet   Oral   Take 1,000 Units by mouth daily.         . clonazePAM (KLONOPIN) 2 MG tablet   Oral   Take 2 mg by mouth 3 (three) times daily.         . DULoxetine (CYMBALTA) 60 MG capsule   Oral   Take 60 mg by mouth daily.         . fish oil-omega-3 fatty acids 1000 MG capsule   Oral   Take 1 g by mouth daily.          [name] gabapentin (NEURONTIN) 300 MG capsule   Oral   Take 300 mg by mouth 4 (four) times daily.         [name] oxyCODONE (OXYCONTIN) 20 MG 12 hr tablet   Oral   Take 20 mg by mouth every 12 (twelve) hours as needed for pain (for fibromyalgia).         [name] oxyCODONE-acetaminophen (PERCOCET/ROXICET) 5-325 MG per tablet   Oral   Take 1 tablet by mouth every 8 (eight) hours as needed for pain.   11 tablet   0   . pregabalin (LYRICA) 300 MG capsule   Oral   Take 300 mg by mouth daily.         . ranitidine (ZANTAC) 300 MG tablet   Oral   Take 300 mg by mouth 2 (two) times daily as needed for heartburn.         [name] tiZANidine (ZANAFLEX) 4 MG tablet   Oral   Take 4 mg by mouth every 8 (eight) hours as needed (muscle relaxation and fibromyalgia).          . vitamin B-12 (CYANOCOBALAMIN) 1000 MCG tablet   Oral   Take 1,000 mcg by mouth daily.           BP 91/65  Pulse 71  Temp(Src) 98.2 F (36.8 C) (Oral)  Resp 14  SpO2 99%  Physical Exam  Nursing note and vitals reviewed. Constitutional: He is oriented to person, place, and time. He appears well-developed and well-nourished.  HENT:  Head: Normocephalic and atraumatic.  Mouth/Throat: Oropharynx is clear and moist.  Eyes: Conjunctivae and EOM are normal. Pupils are equal, round, and reactive to light.  Neck: Normal range of motion. Neck supple.  Cardiovascular: Normal rate, regular rhythm and normal heart sounds.   Pulmonary/Chest: Effort normal and breath sounds normal.  Abdominal: Soft. Bowel sounds are normal. There is no  tenderness. There  is no guarding.  Musculoskeletal: Normal range of motion. He exhibits no edema.  1cm opening of medial right knee surgical incision; draining minimal amounts of serous fluids; no surrounding erythema, swelling, induration, or signs of cellulitis; no warmth to touch; strong distal pulse, sensation intact; no calf asymmetry or TTP  Neurological: He is alert and oriented to person, place, and time. He has normal strength. He displays no tremor. No cranial nerve deficit or sensory deficit. He displays no seizure activity. Gait normal.  No focal neuro deficits appreciated; no tremors or seizure activity  Skin: Skin is warm and dry.  Psychiatric: He has a normal mood and affect.       ED Course  Procedures (including critical care time) Labs Review Labs Reviewed  CBC WITH DIFFERENTIAL - Abnormal; Notable for the following:    Neutrophils Relative % 32 (*)    Lymphocytes Relative 54 (*)    Lymphs Abs 5.3 (*)    Eosinophils Relative 8 (*)    Eosinophils Absolute 0.8 (*)    All other components within normal limits  COMPREHENSIVE METABOLIC PANEL - Abnormal; Notable for the following:    Total Bilirubin 0.2 (*)    All other components within normal limits  ETHANOL  URINE RAPID DRUG SCREEN (HOSP PERFORMED)  POCT I-STAT TROPONIN I   Imaging Review No results found.  MDM   1. Knee pain, right   2. Seizure     While in room at initial evaluation, pt talking in circles about subjects unrelated to why he is in the ED today.  He does not acknowledge that he was given percocet 3 days ago at [facility] ED visit and when questioned about this began fumbling with his words and eventually states "well, i only have 1 left because i have been trying to stretch them out."  Pt requested pain medications 4 times before i left the room.  Feel that pt exhibiting drug seeking behavior and i will not give further narcotic pain meds.  Wound does not appear infected, serous drainage only without odor, does not appear  to be any larger than at previous ED visit.  I have reviewed notes from Dr. Lajoyce Corners and do not feel that another consult regarding this incision is needed as pt has already been instructed to FU in office which he has not done.  4:34 PM Notified by nursing staff that pt was not happy with choice of pain medication given to him and wishes to leave.  Pt is independent, baseline oriented, no neuro deficits appreciated, capable of making his own medical decisions.  Risks and benefits discussed with pt about leaving AMA including worsening condition, deterioration, increased pain, etc-- pt acknowledged understanding and elected to sign out AMA.  Garlon Hatchet, PA-C 08/20/13 1738

## 2013-08-20 NOTE — ED Notes (Signed)
Patient got upset after RN Sherian Maroon gave him pain medicine. Patient advised that RN  Will be in room shortly. RN notified that patient was upset about not receiving desired pain medicine. Patient stated that "he knows his medicines, and that he reads the PDR and that he knows that Toradol is an anti inflammatory drug and not for pain and if we're not going to treat his pain and seizures he's going to leave."

## 2013-08-20 NOTE — ED Notes (Signed)
Pt disgruntled per front window nurse and NT rachel that he did not receive any other medication aside from toradol. He asked for clonazepam for his seizures but I told him we were waiting for his labs to result. Pt left AMA

## 2013-08-20 NOTE — ED Provider Notes (Signed)
Medical screening examination/treatment/procedure(s) were conducted as a shared visit with non-physician practitioner(s) and myself.  I personally evaluated the patient during the encounter  Doug Sou, MD 08/20/13 [phone]

## 2013-08-21 ENCOUNTER — Emergency Department (HOSPITAL_COMMUNITY)
Admission: EM | Admit: 2013-08-21 | Discharge: 2013-08-21 | Discharge status: Against Medical Advice | Payer: Medicare Other | Attending: Emergency Medicine | Admitting: Emergency Medicine

## 2013-08-21 ENCOUNTER — Encounter (HOSPITAL_COMMUNITY): Payer: Self-pay | Admitting: *Deleted

## 2013-08-21 DIAGNOSIS — Y838 Other surgical procedures as the cause of abnormal reaction of the patient, or of later complication, without mention of misadventure at the time of the procedure: Secondary | ICD-10-CM | POA: Insufficient documentation

## 2013-08-21 DIAGNOSIS — T8131XA Disruption of external operation (surgical) wound, not elsewhere classified, initial encounter: Secondary | ICD-10-CM | POA: Insufficient documentation

## 2013-08-21 DIAGNOSIS — F17200 Nicotine dependence, unspecified, uncomplicated: Secondary | ICD-10-CM | POA: Insufficient documentation

## 2013-08-21 DIAGNOSIS — G40909 Epilepsy, unspecified, not intractable, without status epilepticus: Secondary | ICD-10-CM | POA: Insufficient documentation

## 2013-08-21 NOTE — ED Notes (Signed)
Pt states that he is going home and needs IV out. I took out IV and he wanted bracelet cut off which i did and he left saying he was going home

## 2013-08-21 NOTE — ED Notes (Signed)
[place], right knee gave out from the surgery he had 3 weeks ago, and he hit posterior head and woke up with headache.  Pt thinks he had seizure and has been out of klonopin.   Pt complains of right knee incision open and draining and back pain

## 2013-08-21 NOTE — ED Provider Notes (Signed)
Medical screening examination/treatment/procedure(s) were performed by non-physician practitioner and as supervising physician I was immediately available for consultation/collaboration.    Clydette Privitera L Aniket Paye, MD 08/21/13 1507 

## 2013-09-13 ENCOUNTER — Emergency Department (HOSPITAL_COMMUNITY)
Admission: EM | Admit: 2013-09-13 | Discharge: 2013-09-13 | Disposition: A | Discharge status: Home / Self Care | Payer: Medicare Other | Attending: Emergency Medicine | Admitting: Emergency Medicine

## 2013-09-13 ENCOUNTER — Encounter (HOSPITAL_COMMUNITY): Payer: Self-pay | Admitting: *Deleted

## 2013-09-13 DIAGNOSIS — G40909 Epilepsy, unspecified, not intractable, without status epilepticus: Secondary | ICD-10-CM | POA: Insufficient documentation

## 2013-09-13 DIAGNOSIS — K219 Gastro-esophageal reflux disease without esophagitis: Secondary | ICD-10-CM | POA: Insufficient documentation

## 2013-09-13 DIAGNOSIS — Z872 Personal history of diseases of the skin and subcutaneous tissue: Secondary | ICD-10-CM | POA: Insufficient documentation

## 2013-09-13 DIAGNOSIS — M25469 Effusion, unspecified knee: Secondary | ICD-10-CM | POA: Insufficient documentation

## 2013-09-13 DIAGNOSIS — F410 Panic disorder [episodic paroxysmal anxiety] without agoraphobia: Secondary | ICD-10-CM | POA: Insufficient documentation

## 2013-09-13 DIAGNOSIS — Z9889 Other specified postprocedural states: Secondary | ICD-10-CM | POA: Insufficient documentation

## 2013-09-13 DIAGNOSIS — T814XXD Infection following a procedure, subsequent encounter: Secondary | ICD-10-CM

## 2013-09-13 DIAGNOSIS — R269 Unspecified abnormalities of gait and mobility: Secondary | ICD-10-CM | POA: Insufficient documentation

## 2013-09-13 DIAGNOSIS — F17200 Nicotine dependence, unspecified, uncomplicated: Secondary | ICD-10-CM | POA: Insufficient documentation

## 2013-09-13 DIAGNOSIS — M79609 Pain in unspecified limb: Secondary | ICD-10-CM | POA: Insufficient documentation

## 2013-09-13 DIAGNOSIS — Z79899 Other long term (current) drug therapy: Secondary | ICD-10-CM | POA: Insufficient documentation

## 2013-09-13 DIAGNOSIS — G8918 Other acute postprocedural pain: Secondary | ICD-10-CM | POA: Insufficient documentation

## 2013-09-13 DIAGNOSIS — G43909 Migraine, unspecified, not intractable, without status migrainosus: Secondary | ICD-10-CM | POA: Insufficient documentation

## 2013-09-13 LAB — COMPREHENSIVE METABOLIC PANEL
Albumin: 4.1 g/dL (ref 3.5–5.2)
Alkaline Phosphatase: 98 U/L (ref 39–117)
BUN: 10 mg/dL (ref 6–23)
CO2: 26 mEq/L (ref 19–32)
Chloride: 100 mEq/L (ref 96–112)
Glucose, Bld: 80 mg/dL (ref 70–99)
Potassium: 3.7 mEq/L (ref 3.5–5.1)
Total Bilirubin: 0.1 mg/dL — ABNORMAL LOW (ref 0.3–1.2)

## 2013-09-13 LAB — CBC WITH DIFFERENTIAL/PLATELET
Hemoglobin: 13.5 g/dL (ref 13.0–17.0)
Lymphocytes Relative: 26 % (ref 12–46)
Lymphs Abs: 3.6 10*3/uL (ref 0.7–4.0)
Monocytes Relative: 12 % (ref 3–12)
Neutro Abs: 8 10*3/uL — ABNORMAL HIGH (ref 1.7–7.7)
Neutrophils Relative %: 59 % (ref 43–77)
RBC: 4.24 MIL/uL (ref 4.22–5.81)

## 2013-09-13 MED ORDER — ONDANSETRON 4 MG PO TBDP
8.0000 mg | ORAL_TABLET | Freq: Once | ORAL | Status: AC
  Administered 2013-09-13: 8 mg via ORAL
  Filled 2013-09-13: qty 2

## 2013-09-13 MED ORDER — OXYCODONE-ACETAMINOPHEN 5-325 MG PO TABS
2.0000 | ORAL_TABLET | Freq: Once | ORAL | Status: AC
  Administered 2013-09-13: 2 via ORAL
  Filled 2013-09-13: qty 2

## 2013-09-13 MED ORDER — CEPHALEXIN 500 MG PO CAPS: 500.0000 mg | ORAL_CAPSULE | Freq: Four times a day (QID) | ORAL | Status: DC

## 2013-09-13 NOTE — ED Notes (Signed)
Pt given 2 bus pass tickets for him and his sister.

## 2013-09-13 NOTE — ED Notes (Signed)
PA made aware of patients pain.  

## 2013-09-13 NOTE — ED Notes (Signed)
Pt had tibial plateu fracture about 2-3 years ago and had it repaired with 8 pins. Was experiencing increased pain and went to see Dr. Reuel Boom? At [facility]. Had the hardware removed due to pain on August 20th. On September 7th he went back to have sutures removed. Pt was then incarcerated on September 10th and released yesterday. He has noticed it has been continuously draining and sts "I know its infected, I can feel it. It has a hole in it and I can see my bone and muscle tissue."

## 2013-09-13 NOTE — ED Notes (Signed)
Pt with knee surgery to R knee August 20 th.  Pt state that when he was in jail the surgical site became infected.   1 x 1 inch hole noted to surgical site - no purulent drainage or warmth noted.

## 2013-09-13 NOTE — ED Provider Notes (Signed)
CSN: [id number]     Arrival date & time 09/13/13  1123 History   First MD Initiated Contact with Patient 09/13/13 1218     Chief Complaint  Patient presents with  . Leg Pain   (Consider location/radiation/quality/duration/timing/severity/associated sxs/prior Treatment) HPI Comments: Patient is a 42 year old male who presents today for worsening right leg pain. He had his hardware removed from his right knee on August 20 by Dr. Lajoyce Corners. Since that time he has been having trouble achieving pain control. He was seen on 9/8 in the emergency department it was not given any pain medication at that time he ended up leaving AGAINST MEDICAL ADVICE. Shortly thereafter he was brought to jail for trespassing. He was released from jail yesterday. He reports that while he was in jail he was read as using Percocet and possibly an unknown antibiotic that he has now run out of. Percocet He has not followed up with Dr. Lajoyce Corners because he does not believe that Medicare would pay for this anymore. He denies fever, shortness of breath, chest pain, nausea, vomiting, abdominal pain. He has been using crutches to walk. The pain is worse with weightbearing.  Patient is a 42 y.o. male presenting with leg pain. The history is provided by the patient. No language interpreter was used.  Leg Pain Associated symptoms: no fever     Past Medical History  Diagnosis Date  . Depression   . GERD (gastroesophageal reflux disease)   . Migraine   . Ulcer     hx of stomach ulcer  . History of meningitis 2000  . 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  . Seizure     has had recent seizures ,off medication  . Shortness of breath     uses inhalers ,with exertion  . Panic attack   . Restless leg syndrome   . Fibromyalgia    Past Surgical History  Procedure Laterality Date  . Brain surgery  2000    Removed growth?  . Orif tibia plateau  12/13/2011    Procedure: OPEN REDUCTION INTERNAL FIXATION  (ORIF) TIBIAL PLATEAU;  Surgeon: Shelda Pal;  Location: [facility];  Service: Orthopedics;  Laterality: Right;  . Mastoidectomy Right 1/1/ 2000  . Arthroscopic knee    . Hardware removal Right 08/01/2013    Procedure: HARDWARE REMOVAL, right tibial plateau;  Surgeon: Nadara Mustard, MD;  Location: [facility];  Service: Orthopedics;  Laterality: Right;  Removal Deep Retained Hardware Right Knee   Family History  Problem Relation Age of Onset  . Arthritis Mother   . COPD Mother   . Emphysema Mother   . Heart disease Mother   . Depression Father   . Cirrhosis Father   . Diabetes Maternal Aunt   . Diabetes Maternal Uncle    History  Substance Use Topics  . Smoking status: Current Every Day Smoker -- 0.50 packs/day for 15 years    Types: Cigarettes  . Smokeless tobacco: Never Used  . Alcohol Use: No    Review of Systems  Constitutional: Negative for fever and chills.  Gastrointestinal: Negative for nausea, vomiting and abdominal pain.  Musculoskeletal: Positive for myalgias, joint swelling, arthralgias and gait problem.  All other systems reviewed and are negative.    Allergies  Bee venom; Neurontin; Paroxetine hcl; Phenobarbital; Phenytoin; Tegretol; Topamax; and Prednisone  Home Medications   Current Outpatient Rx  Name  Route  Sig  Dispense  Refill  . albuterol (PROVENTIL HFA;VENTOLIN  HFA) 108 (90 BASE) MCG/ACT inhaler   Inhalation   Inhale 2 puffs into the lungs every 6 (six) hours as needed for wheezing.         [name] amphetamine-dextroamphetamine (ADDERALL) 30 MG tablet   Oral   Take 30 mg by mouth daily.         . Aspirin-Acetaminophen-Caffeine (GOODYS EXTRA STRENGTH PO)   Oral   Take 1-2 packets by mouth every 6 (six) hours as needed (pain).         . B Complex-C (SUPER B COMPLEX PO)   Oral   Take 1 tablet by mouth daily.         . cetirizine (ZYRTEC) 10 MG tablet   Oral   Take 10 mg by mouth daily as needed for allergies.         . cholecalciferol  (VITAMIN D) 1000 UNITS tablet   Oral   Take 1,000 Units by mouth daily.         . clonazePAM (KLONOPIN) 2 MG tablet   Oral   Take 2 mg by mouth 3 (three) times daily.         [name] oxyCODONE-acetaminophen (PERCOCET) 10-325 MG per tablet   Oral   Take 1 tablet by mouth 3 (three) times daily as needed for pain.         . pregabalin (LYRICA) 300 MG capsule   Oral   Take 300 mg by mouth daily.         . ranitidine (ZANTAC) 300 MG tablet   Oral   Take 300 mg by mouth 2 (two) times daily as needed for heartburn.         [name] tiZANidine (ZANAFLEX) 4 MG tablet   Oral   Take 4 mg by mouth every 8 (eight) hours as needed (muscle relaxation and fibromyalgia).          . vitamin B-12 (CYANOCOBALAMIN) 100 MCG tablet   Oral   Take 100 mcg by mouth daily.          BP 124/88  Pulse 117  Temp(Src) 98.8 F (37.1 C) (Oral)  Resp 16  Ht 5\' 4"  (1.626 m)  Wt 145 lb (65.772 kg)  BMI 24.88 kg/m2  SpO2 97% Physical Exam  Nursing note and vitals reviewed. Constitutional: He is oriented to person, place, and time. He appears well-developed and well-nourished. No distress.  HENT:  Head: Normocephalic and atraumatic.  Right Ear: External ear normal.  Left Ear: External ear normal.  Nose: Nose normal.  Eyes: Conjunctivae are normal.  Neck: Normal range of motion. No tracheal deviation present.  Cardiovascular: Normal rate, regular rhythm and normal heart sounds.   Pulmonary/Chest: Effort normal and breath sounds normal. No stridor.  Abdominal: Soft. He exhibits no distension. There is no tenderness.  Musculoskeletal: Normal range of motion.  Tenderness to palpation diffusely over right knee.   Neurological: He is alert and oriented to person, place, and time.  Skin: Skin is warm and dry. He is not diaphoretic.  8 cm incision site over her left knee. Central area where incision site has opened. Serous and purulent discharge. No surrounding erythema.  Psychiatric: He has a normal mood  and affect. His behavior is normal.    ED Course  Procedures (including critical care time) Labs Review Labs Reviewed  CBC WITH DIFFERENTIAL - Abnormal; Notable for the following:    WBC 13.6 (*)    HCT 38.9 (*)    Neutro Abs 8.0 (*)  Monocytes Absolute 1.7 (*)    All other components within normal limits  COMPREHENSIVE METABOLIC PANEL - Abnormal; Notable for the following:    Total Bilirubin 0.1 (*)    All other components within normal limits   Imaging Review No results found.  MDM   1. Post op infection, subsequent encounter    Patient presents with postop infection of right knee. He was given 10 days of Keflex. Discussed that he needs to follow-up with Dr. Lajoyce Corners. No concern for osteomyelitis. Dr. Fayrene Fearing and evaluated patient and agrees with plan. Neurovascularly intact. Compartment soft. Return instructions given. Vital signs stable for discharge. Patient / Family / Caregiver informed of clinical course, understand medical decision-making process, and agree with plan.    Mora Bellman, PA-C 09/13/13 [phone]

## 2013-09-16 ENCOUNTER — Emergency Department (HOSPITAL_COMMUNITY)
Admission: EM | Admit: 2013-09-16 | Discharge: 2013-09-16 | Disposition: A | Discharge status: Home / Self Care | Payer: Medicare Other | Attending: Emergency Medicine | Admitting: Emergency Medicine

## 2013-09-16 DIAGNOSIS — F17200 Nicotine dependence, unspecified, uncomplicated: Secondary | ICD-10-CM | POA: Insufficient documentation

## 2013-09-16 DIAGNOSIS — Z8711 Personal history of peptic ulcer disease: Secondary | ICD-10-CM | POA: Insufficient documentation

## 2013-09-16 DIAGNOSIS — T814XXD Infection following a procedure, subsequent encounter: Secondary | ICD-10-CM

## 2013-09-16 DIAGNOSIS — Z8781 Personal history of (healed) traumatic fracture: Secondary | ICD-10-CM | POA: Insufficient documentation

## 2013-09-16 DIAGNOSIS — G43909 Migraine, unspecified, not intractable, without status migrainosus: Secondary | ICD-10-CM | POA: Insufficient documentation

## 2013-09-16 DIAGNOSIS — Z79899 Other long term (current) drug therapy: Secondary | ICD-10-CM | POA: Insufficient documentation

## 2013-09-16 DIAGNOSIS — G40909 Epilepsy, unspecified, not intractable, without status epilepticus: Secondary | ICD-10-CM | POA: Insufficient documentation

## 2013-09-16 DIAGNOSIS — F410 Panic disorder [episodic paroxysmal anxiety] without agoraphobia: Secondary | ICD-10-CM | POA: Insufficient documentation

## 2013-09-16 DIAGNOSIS — T8140XA Infection following a procedure, unspecified, initial encounter: Secondary | ICD-10-CM | POA: Insufficient documentation

## 2013-09-16 DIAGNOSIS — Y838 Other surgical procedures as the cause of abnormal reaction of the patient, or of later complication, without mention of misadventure at the time of the procedure: Secondary | ICD-10-CM | POA: Insufficient documentation

## 2013-09-16 DIAGNOSIS — Z8661 Personal history of infections of the central nervous system: Secondary | ICD-10-CM | POA: Insufficient documentation

## 2013-09-16 DIAGNOSIS — K219 Gastro-esophageal reflux disease without esophagitis: Secondary | ICD-10-CM | POA: Insufficient documentation

## 2013-09-16 DIAGNOSIS — Z792 Long term (current) use of antibiotics: Secondary | ICD-10-CM | POA: Insufficient documentation

## 2013-09-16 LAB — CBC WITH DIFFERENTIAL/PLATELET
Basophils Absolute: 0 10*3/uL (ref 0.0–0.1)
Eosinophils Absolute: 0.5 10*3/uL (ref 0.0–0.7)
Eosinophils Relative: 6 % — ABNORMAL HIGH (ref 0–5)
Lymphocytes Relative: 41 % (ref 12–46)
MCV: 92.8 fL (ref 78.0–100.0)
Platelets: 287 10*3/uL (ref 150–400)
RDW: 13 % (ref 11.5–15.5)
WBC: 9.1 10*3/uL (ref 4.0–10.5)

## 2013-09-16 LAB — BASIC METABOLIC PANEL
CO2: 24 mEq/L (ref 19–32)
Calcium: 9.2 mg/dL (ref 8.4–10.5)
GFR calc Af Amer: >90 mL/min (ref >90)
GFR calc non Af Amer: >90 mL/min (ref >90)
Sodium: 141 mEq/L (ref 135–145)

## 2013-09-16 MED ORDER — TRAMADOL HCL 50 MG PO TABS: 50.0000 mg | ORAL_TABLET | Freq: Four times a day (QID) | ORAL | Status: DC | PRN

## 2013-09-16 MED ORDER — OXYCODONE-ACETAMINOPHEN 5-325 MG PO TABS
2.0000 | ORAL_TABLET | Freq: Once | ORAL | Status: AC
  Administered 2013-09-16: 2 via ORAL
  Filled 2013-09-16: qty 2

## 2013-09-16 MED ORDER — CEPHALEXIN 500 MG PO CAPS: 500.0000 mg | ORAL_CAPSULE | Freq: Four times a day (QID) | ORAL | Status: DC

## 2013-09-16 MED ORDER — ACETAMINOPHEN 500 MG PO TABS
1000.0000 mg | ORAL_TABLET | Freq: Once | ORAL | Status: DC
  Filled 2013-09-16: qty 2

## 2013-09-16 NOTE — ED Provider Notes (Signed)
Medical screening examination/treatment/procedure(s) were conducted as a shared visit with non-physician practitioner(s) and myself.  I personally evaluated the patient during the encounter  Patient seen and examined. He was recently incarcerated and did not have his antibiotic fall being incarcerated. The wound has become slightly more open. Drains only clear to yellow fluid. No surrounding erythema. Remainder of the wound appears solid in does not appear that is going to more openly dehisce. Plan is packing in the wound. Continue antibiotics. Surgical followup.  Roney Marion, MD 09/16/13 [phone]

## 2013-09-16 NOTE — ED Notes (Signed)
Per EMS pt had hardware removed from right proximal tibia- wound still open. Pt fell today- denies LOC. C/o pain in leg able to move all extremities. In NAD

## 2013-09-16 NOTE — ED Notes (Signed)
Pt caught trying to steal multiple items from the room, including linens, IV supplies, gauze, tape, and other items.  Consulting civil engineer, [employer] notified.  Pt escorted out of the hospital by [employer].

## 2013-09-16 NOTE — ED Provider Notes (Signed)
CSN: [id number]     Arrival date & time 09/16/13  1427 History   First MD Initiated Contact with Patient 09/16/13 1430     Chief Complaint  Patient presents with  . Leg Pain   (Consider location/radiation/quality/duration/timing/severity/associated sxs/prior Treatment) HPI  [name] Mcdonald is a 42 y.o. male complaining of pain to RLE. Pt has poorly healing, mildly infected post-surgical wound. Hardware from tibial plateau fracture ORIF removed on 08/01/2013 for unknown indications.   Pt has had open wound with bloody and white discharge and reports pain to the area. He has inconsistently followed with Dr. Lajoyce Corners, but has an appointment this Friday. Pt has been taking keflex as directed by ED. Pt denies fever, NV, increasing discharge, worsening pain.   Past Medical History  Diagnosis Date  . Depression   . GERD (gastroesophageal reflux disease)   . Migraine   . Ulcer     hx of stomach ulcer  . History of meningitis 2000  . 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  . Seizure     has had recent seizures ,off medication  . Shortness of breath     uses inhalers ,with exertion  . Panic attack   . Restless leg syndrome   . Fibromyalgia    Past Surgical History  Procedure Laterality Date  . Brain surgery  2000    Removed growth?  . Orif tibia plateau  12/13/2011    Procedure: OPEN REDUCTION INTERNAL FIXATION (ORIF) TIBIAL PLATEAU;  Surgeon: Shelda Pal;  Location: [facility];  Service: Orthopedics;  Laterality: Right;  . Mastoidectomy Right 1/1/ 2000  . Arthroscopic knee    . Hardware removal Right 08/01/2013    Procedure: HARDWARE REMOVAL, right tibial plateau;  Surgeon: Nadara Mustard, MD;  Location: [facility];  Service: Orthopedics;  Laterality: Right;  Removal Deep Retained Hardware Right Knee   Family History  Problem Relation Age of Onset  . Arthritis Mother   . COPD Mother   . Emphysema Mother   . Heart disease Mother   . Depression Father   .  Cirrhosis Father   . Diabetes Maternal Aunt   . Diabetes Maternal Uncle    History  Substance Use Topics  . Smoking status: Current Every Day Smoker -- 0.50 packs/day for 15 years    Types: Cigarettes  . Smokeless tobacco: Never Used  . Alcohol Use: No    Review of Systems 10 systems reviewed and found to be negative, except as noted in the HPI  Allergies  Bee venom; Neurontin; Paroxetine hcl; Phenobarbital; Phenytoin; Tegretol; Topamax; and Prednisone  Home Medications   Current Outpatient Rx  Name  Route  Sig  Dispense  Refill  . albuterol (PROVENTIL HFA;VENTOLIN HFA) 108 (90 BASE) MCG/ACT inhaler   Inhalation   Inhale 2 puffs into the lungs every 6 (six) hours as needed for wheezing.         [name] amphetamine-dextroamphetamine (ADDERALL) 30 MG tablet   Oral   Take 30 mg by mouth daily.         . B Complex-C (SUPER B COMPLEX PO)   Oral   Take 1 tablet by mouth daily.         . cephALEXin (KEFLEX) 500 MG capsule   Oral   Take 1 capsule (500 mg total) by mouth 4 (four) times daily.   40 capsule   0   . cetirizine (ZYRTEC) 10 MG tablet   Oral  Take 10 mg by mouth daily as needed for allergies.         . clonazePAM (KLONOPIN) 2 MG tablet   Oral   Take 2 mg by mouth 3 (three) times daily.         [name] oxycodone (ROXICODONE) 30 MG immediate release tablet   Oral   Take 30 mg by mouth every 8 (eight) hours as needed for pain.         . pregabalin (LYRICA) 300 MG capsule   Oral   Take 300 mg by mouth daily.         . pseudoephedrine (SUDAFED) 60 MG tablet   Oral   Take 60 mg by mouth every 4 (four) hours as needed for congestion.         . ranitidine (ZANTAC) 300 MG tablet   Oral   Take 300 mg by mouth 2 (two) times daily as needed for heartburn.         [name] tiZANidine (ZANAFLEX) 4 MG tablet   Oral   Take 4 mg by mouth every 8 (eight) hours as needed (muscle relaxation and fibromyalgia).          . vitamin B-12 (CYANOCOBALAMIN) 100 MCG tablet    Oral   Take 100 mcg by mouth daily.          BP 94/65  Pulse 82  Temp(Src) 98.5 F (36.9 C) (Oral)  Resp 16  SpO2 100% Physical Exam  Nursing note and vitals reviewed. Constitutional: He is oriented to person, place, and time. He appears well-developed and well-nourished. No distress.  HENT:  Head: Normocephalic.  Eyes: Conjunctivae and EOM are normal.  Cardiovascular: Normal rate.   Pulmonary/Chest: Effort normal. No stridor.  Musculoskeletal: Normal range of motion.  Neurological: He is alert and oriented to person, place, and time.  Skin:  1cm open wound with good granulation tissue and no induration or warmth.   Psychiatric: He has a normal mood and affect.        ED Course  Procedures (including critical care time) Labs Review Labs Reviewed  CBC WITH DIFFERENTIAL - Abnormal; Notable for the following:    Eosinophils Relative 6 (*)    All other components within normal limits  BASIC METABOLIC PANEL   Imaging Review No results found.  MDM   1. Post op infection, subsequent encounter   2. Tobacco use disorder    Filed Vitals:   09/16/13 1455 09/16/13 1601  BP: 94/65 99/62  Pulse: 82 86  Temp: 98.5 F (36.9 C) 98.3 F (36.8 C)  TempSrc: Oral Oral  Resp: 16 16  SpO2: 100% 97%     [name] Mcdonald is a 42 y.o. male with poorly healing surgical inscison, possibly infected. Bloodwork shows no leukocytosis. Will give more Abx and tramadol. ADvised him that he must follow with the surgeon for the post-surgical complication.   Medications  acetaminophen (TYLENOL) tablet 1,000 mg (not administered)  oxyCODONE-acetaminophen (PERCOCET/ROXICET) 5-325 MG per tablet 2 tablet (2 tablets Oral Given 09/16/13 1527)    Pt is hemodynamically stable, appropriate for, and amenable to discharge at this time. Pt verbalized understanding and agrees with care plan. All questions answered. Outpatient follow-up and specific return precautions discussed.    New Prescriptions    CEPHALEXIN (KEFLEX) 500 MG CAPSULE    Take 1 capsule (500 mg total) by mouth 4 (four) times daily.   TRAMADOL (ULTRAM) 50 MG TABLET    Take 1 tablet (50 mg total) by mouth  every 6 (six) hours as needed for pain.    Note: Portions of this report may have been transcribed using voice recognition software. Every effort was made to ensure accuracy; however, inadvertent computerized transcription errors may be present      Wynetta Emery, PA-C 09/16/13 1718

## 2013-09-16 NOTE — Progress Notes (Signed)
Orthopedic Tech Progress Note Patient Details:  [name] Mcdonald 09-06-1971 [id number]  Ortho Devices Type of Ortho Device: Crutches Ortho Device/Splint Interventions: Application   Cammer, [name] Mcdonald 09/16/2013, 3:40 PM

## 2013-09-17 ENCOUNTER — Emergency Department (HOSPITAL_COMMUNITY): Payer: Medicare Other

## 2013-09-17 ENCOUNTER — Emergency Department (HOSPITAL_COMMUNITY)
Admission: EM | Admit: 2013-09-17 | Discharge: 2013-09-17 | Disposition: A | Discharge status: Home / Self Care | Payer: Medicare Other | Attending: Emergency Medicine | Admitting: Emergency Medicine

## 2013-09-17 ENCOUNTER — Encounter (HOSPITAL_COMMUNITY): Payer: Self-pay | Admitting: Nurse Practitioner

## 2013-09-17 DIAGNOSIS — Y838 Other surgical procedures as the cause of abnormal reaction of the patient, or of later complication, without mention of misadventure at the time of the procedure: Secondary | ICD-10-CM | POA: Insufficient documentation

## 2013-09-17 DIAGNOSIS — G43909 Migraine, unspecified, not intractable, without status migrainosus: Secondary | ICD-10-CM | POA: Insufficient documentation

## 2013-09-17 DIAGNOSIS — M79609 Pain in unspecified limb: Secondary | ICD-10-CM | POA: Insufficient documentation

## 2013-09-17 DIAGNOSIS — K219 Gastro-esophageal reflux disease without esophagitis: Secondary | ICD-10-CM | POA: Insufficient documentation

## 2013-09-17 DIAGNOSIS — F329 Major depressive disorder, single episode, unspecified: Secondary | ICD-10-CM | POA: Insufficient documentation

## 2013-09-17 DIAGNOSIS — Z79899 Other long term (current) drug therapy: Secondary | ICD-10-CM | POA: Insufficient documentation

## 2013-09-17 DIAGNOSIS — Z8711 Personal history of peptic ulcer disease: Secondary | ICD-10-CM | POA: Insufficient documentation

## 2013-09-17 DIAGNOSIS — R531 Weakness: Secondary | ICD-10-CM

## 2013-09-17 DIAGNOSIS — R5381 Other malaise: Secondary | ICD-10-CM | POA: Insufficient documentation

## 2013-09-17 DIAGNOSIS — Z792 Long term (current) use of antibiotics: Secondary | ICD-10-CM | POA: Insufficient documentation

## 2013-09-17 DIAGNOSIS — F3289 Other specified depressive episodes: Secondary | ICD-10-CM | POA: Insufficient documentation

## 2013-09-17 DIAGNOSIS — F17200 Nicotine dependence, unspecified, uncomplicated: Secondary | ICD-10-CM | POA: Insufficient documentation

## 2013-09-17 DIAGNOSIS — F410 Panic disorder [episodic paroxysmal anxiety] without agoraphobia: Secondary | ICD-10-CM | POA: Insufficient documentation

## 2013-09-17 DIAGNOSIS — T8189XA Other complications of procedures, not elsewhere classified, initial encounter: Secondary | ICD-10-CM | POA: Insufficient documentation

## 2013-09-17 DIAGNOSIS — G8929 Other chronic pain: Secondary | ICD-10-CM | POA: Insufficient documentation

## 2013-09-17 DIAGNOSIS — G40909 Epilepsy, unspecified, not intractable, without status epilepticus: Secondary | ICD-10-CM | POA: Insufficient documentation

## 2013-09-17 DIAGNOSIS — Z8661 Personal history of infections of the central nervous system: Secondary | ICD-10-CM | POA: Insufficient documentation

## 2013-09-17 LAB — GLUCOSE, CAPILLARY: Glucose-Capillary: 88 mg/dL (ref 70–99)

## 2013-09-17 MED ORDER — TRAMADOL HCL 50 MG PO TABS
50.0000 mg | ORAL_TABLET | Freq: Once | ORAL | Status: AC
  Administered 2013-09-17: 50 mg via ORAL
  Filled 2013-09-17: qty 1

## 2013-09-17 NOTE — ED Notes (Signed)
Pt given 2 cokes and a cup of ice

## 2013-09-17 NOTE — ED Notes (Signed)
cbg 88 

## 2013-09-17 NOTE — ED Provider Notes (Signed)
CSN: [id number]     Arrival date & time 09/17/13  0919 History   First MD Initiated Contact with Patient 09/17/13 [phone]     Chief Complaint  Patient presents with  . Nausea   (Consider location/radiation/quality/duration/timing/severity/associated sxs/prior Treatment) The history is provided by the patient.   Patient complaining of nausea without vomiting and worsening chronic leg pain. Seen here for similar symptoms yesterday and diagnosed with chronic nonhealing wound. Placed on Keflex and Ultram and has appointment scheduled with his orthopedist in 4 days. Woke this morning and said that he didn't feel right. Denies any fever. No drainage from the wound. Symptoms have now subsided. Denies any recent cough or urinary symptoms. No rashes noted. Nothing makes her symptoms better or worse. Past Medical History  Diagnosis Date  . Depression   . GERD (gastroesophageal reflux disease)   . Migraine   . Ulcer     hx of stomach ulcer  . History of meningitis 2000  . 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  . Seizure     has had recent seizures ,off medication  . Shortness of breath     uses inhalers ,with exertion  . Panic attack   . Restless leg syndrome   . Fibromyalgia    Past Surgical History  Procedure Laterality Date  . Brain surgery  2000    Removed growth?  . Orif tibia plateau  12/13/2011    Procedure: OPEN REDUCTION INTERNAL FIXATION (ORIF) TIBIAL PLATEAU;  Surgeon: Shelda Pal;  Location: [facility];  Service: Orthopedics;  Laterality: Right;  . Mastoidectomy Right 1/1/ 2000  . Arthroscopic knee    . Hardware removal Right 08/01/2013    Procedure: HARDWARE REMOVAL, right tibial plateau;  Surgeon: Nadara Mustard, MD;  Location: [facility];  Service: Orthopedics;  Laterality: Right;  Removal Deep Retained Hardware Right Knee   Family History  Problem Relation Age of Onset  . Arthritis Mother   . COPD Mother   . Emphysema Mother   . Heart disease  Mother   . Depression Father   . Cirrhosis Father   . Diabetes Maternal Aunt   . Diabetes Maternal Uncle    History  Substance Use Topics  . Smoking status: Current Every Day Smoker -- 0.50 packs/day for 15 years    Types: Cigarettes  . Smokeless tobacco: Never Used  . Alcohol Use: No    Review of Systems  All other systems reviewed and are negative.    Allergies  Bee venom; Neurontin; Paroxetine hcl; Phenobarbital; Phenytoin; Tegretol; Topamax; and Prednisone  Home Medications   Current Outpatient Rx  Name  Route  Sig  Dispense  Refill  . albuterol (PROVENTIL HFA;VENTOLIN HFA) 108 (90 BASE) MCG/ACT inhaler   Inhalation   Inhale 2 puffs into the lungs every 6 (six) hours as needed for wheezing.         [name] amphetamine-dextroamphetamine (ADDERALL) 30 MG tablet   Oral   Take 30 mg by mouth daily.         . B Complex-C (SUPER B COMPLEX PO)   Oral   Take 1 tablet by mouth daily.         . cephALEXin (KEFLEX) 500 MG capsule   Oral   Take 1 capsule (500 mg total) by mouth 4 (four) times daily.   40 capsule   0   . cephALEXin (KEFLEX) 500 MG capsule   Oral   Take 1 capsule (  500 mg total) by mouth 4 (four) times daily.   8 capsule   0   . cetirizine (ZYRTEC) 10 MG tablet   Oral   Take 10 mg by mouth daily as needed for allergies.         . clonazePAM (KLONOPIN) 2 MG tablet   Oral   Take 2 mg by mouth 3 (three) times daily.         [name] oxycodone (ROXICODONE) 30 MG immediate release tablet   Oral   Take 30 mg by mouth every 8 (eight) hours as needed for pain.         . pregabalin (LYRICA) 300 MG capsule   Oral   Take 300 mg by mouth daily.         . pseudoephedrine (SUDAFED) 60 MG tablet   Oral   Take 60 mg by mouth every 4 (four) hours as needed for congestion.         . ranitidine (ZANTAC) 300 MG tablet   Oral   Take 300 mg by mouth 2 (two) times daily as needed for heartburn.         [name] tiZANidine (ZANAFLEX) 4 MG tablet   Oral   Take 4  mg by mouth every 8 (eight) hours as needed (muscle relaxation and fibromyalgia).          . traMADol (ULTRAM) 50 MG tablet   Oral   Take 1 tablet (50 mg total) by mouth every 6 (six) hours as needed for pain.   15 tablet   0   . vitamin B-12 (CYANOCOBALAMIN) 100 MCG tablet   Oral   Take 100 mcg by mouth daily.          There were no vitals taken for this visit. Physical Exam  Nursing note and vitals reviewed. Constitutional: He is oriented to person, place, and time. He appears well-developed and well-nourished.  Non-toxic appearance. No distress.  HENT:  Head: Normocephalic and atraumatic.  Eyes: Conjunctivae, EOM and lids are normal. Pupils are equal, round, and reactive to light.  Neck: Normal range of motion. Neck supple. No tracheal deviation present. No mass present.  Cardiovascular: Normal rate, regular rhythm and normal heart sounds.  Exam reveals no gallop.   No murmur heard. Pulmonary/Chest: Effort normal and breath sounds normal. No stridor. No respiratory distress. He has no decreased breath sounds. He has no wheezes. He has no rhonchi. He has no rales.  Abdominal: Soft. Normal appearance and bowel sounds are normal. He exhibits no distension. There is no tenderness. There is no rebound and no CVA tenderness.  Musculoskeletal: Normal range of motion. He exhibits no edema and no tenderness.  Wound with healing ulcer without evidence of drainage. Good granulation tissue noted. No blisters appreciated. No change in wound when compared to photographs from yesterday in chart  Neurological: He is alert and oriented to person, place, and time. He has normal strength. No cranial nerve deficit or sensory deficit. GCS eye subscore is 4. GCS verbal subscore is 5. GCS motor subscore is 6.  Skin: Skin is warm and dry. No abrasion and no rash noted.  Psychiatric: He has a normal mood and affect. His speech is normal and behavior is normal.    ED Course  Procedures (including  critical care time) Labs Review Labs Reviewed - No data to display Imaging Review No results found.  MDM  No diagnosis found. Patient instructed to continue with his current medication regimen and keep his appointment  with his orthopedist in 4 days    Toy Baker, MD 09/17/13 1118

## 2013-09-17 NOTE — ED Notes (Signed)
Per ems: pt called today for n/v, leg pain. En route, VSS, A&Ox4, ambulatory. Pt was seen here yesterday for same symptoms and was discharged home. He states symptoms persist.

## 2013-09-18 ENCOUNTER — Emergency Department (HOSPITAL_COMMUNITY)
Admission: EM | Admit: 2013-09-18 | Discharge: 2013-09-18 | Discharge status: Against Medical Advice | Payer: Medicare Other | Attending: Emergency Medicine | Admitting: Emergency Medicine

## 2013-09-18 ENCOUNTER — Encounter (HOSPITAL_COMMUNITY): Payer: Self-pay

## 2013-09-18 DIAGNOSIS — R51 Headache: Secondary | ICD-10-CM | POA: Insufficient documentation

## 2013-09-18 DIAGNOSIS — F3289 Other specified depressive episodes: Secondary | ICD-10-CM | POA: Insufficient documentation

## 2013-09-18 DIAGNOSIS — G8918 Other acute postprocedural pain: Secondary | ICD-10-CM | POA: Insufficient documentation

## 2013-09-18 DIAGNOSIS — F410 Panic disorder [episodic paroxysmal anxiety] without agoraphobia: Secondary | ICD-10-CM | POA: Insufficient documentation

## 2013-09-18 DIAGNOSIS — F329 Major depressive disorder, single episode, unspecified: Secondary | ICD-10-CM | POA: Insufficient documentation

## 2013-09-18 DIAGNOSIS — G40909 Epilepsy, unspecified, not intractable, without status epilepticus: Secondary | ICD-10-CM | POA: Insufficient documentation

## 2013-09-18 DIAGNOSIS — R569 Unspecified convulsions: Secondary | ICD-10-CM

## 2013-09-18 DIAGNOSIS — Z792 Long term (current) use of antibiotics: Secondary | ICD-10-CM | POA: Insufficient documentation

## 2013-09-18 DIAGNOSIS — K219 Gastro-esophageal reflux disease without esophagitis: Secondary | ICD-10-CM | POA: Insufficient documentation

## 2013-09-18 DIAGNOSIS — F17200 Nicotine dependence, unspecified, uncomplicated: Secondary | ICD-10-CM | POA: Insufficient documentation

## 2013-09-18 DIAGNOSIS — Z79899 Other long term (current) drug therapy: Secondary | ICD-10-CM | POA: Insufficient documentation

## 2013-09-18 DIAGNOSIS — M25569 Pain in unspecified knee: Secondary | ICD-10-CM | POA: Insufficient documentation

## 2013-09-18 DIAGNOSIS — G43909 Migraine, unspecified, not intractable, without status migrainosus: Secondary | ICD-10-CM | POA: Insufficient documentation

## 2013-09-18 NOTE — ED Notes (Signed)
Informed EMS of seizure upon arrival to [facility]

## 2013-09-18 NOTE — ED Notes (Signed)
MD at bedside.  Pt alert and active with care.

## 2013-09-18 NOTE — ED Notes (Signed)
Pt refuses blood work and declines to stay for further treatment. Pt discharged AMA

## 2013-09-18 NOTE — ED Provider Notes (Signed)
CSN: [id number]     Arrival date & time 09/18/13  1141 History   First MD Initiated Contact with Patient 09/18/13 1146     Chief Complaint  Patient presents with  . Wound Infection    rt knee  . Knee Pain    rt knee  . Headache  . Seizures   (Consider location/radiation/quality/duration/timing/severity/associated sxs/prior Treatment) HPI  [name] Mcdonald is a 42 y.o. male complaining of seizure and pain to slowly healing post-surgical wound to right knee. Pt states that he has had seizures since having meningitis, questionable compliance with kepra. He is taking his keflex as directed for the post-surgical wound and has an appointment to f/u with orthopedics. Pt denies head trauma,  Cervicalgia, CP, SOB,  fever, chills, N/V, change in bowel or bladder habits. rates his pain at 10/10, cannot describe character, exacerbated by palpation.   Past Medical History  Diagnosis Date  . Depression   . GERD (gastroesophageal reflux disease)   . Migraine   . Ulcer     hx of stomach ulcer  . History of meningitis 2000  . 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  . Seizure     has had recent seizures ,off medication  . Shortness of breath     uses inhalers ,with exertion  . Panic attack   . Restless leg syndrome   . Fibromyalgia    Past Surgical History  Procedure Laterality Date  . Brain surgery  2000    Removed growth?  . Orif tibia plateau  12/13/2011    Procedure: OPEN REDUCTION INTERNAL FIXATION (ORIF) TIBIAL PLATEAU;  Surgeon: Shelda Pal;  Location: [facility];  Service: Orthopedics;  Laterality: Right;  . Mastoidectomy Right 1/1/ 2000  . Arthroscopic knee    . Hardware removal Right 08/01/2013    Procedure: HARDWARE REMOVAL, right tibial plateau;  Surgeon: Nadara Mustard, MD;  Location: [facility];  Service: Orthopedics;  Laterality: Right;  Removal Deep Retained Hardware Right Knee   Family History  Problem Relation Age of Onset  . Arthritis Mother   .  COPD Mother   . Emphysema Mother   . Heart disease Mother   . Depression Father   . Cirrhosis Father   . Diabetes Maternal Aunt   . Diabetes Maternal Uncle    History  Substance Use Topics  . Smoking status: Current Every Day Smoker -- 0.50 packs/day for 15 years    Types: Cigarettes  . Smokeless tobacco: Never Used  . Alcohol Use: No    Review of Systems 10 systems reviewed and found to be negative, except as noted in the HPI  Allergies  Bee venom; Neurontin; Paroxetine hcl; Phenobarbital; Phenytoin; Tegretol; Topamax; and Prednisone  Home Medications   Current Outpatient Rx  Name  Route  Sig  Dispense  Refill  . albuterol (PROVENTIL HFA;VENTOLIN HFA) 108 (90 BASE) MCG/ACT inhaler   Inhalation   Inhale 2 puffs into the lungs every 6 (six) hours as needed for wheezing.         [name] amphetamine-dextroamphetamine (ADDERALL) 30 MG tablet   Oral   Take 30 mg by mouth daily.         . B Complex-C (SUPER B COMPLEX PO)   Oral   Take 1 tablet by mouth daily.         . cephALEXin (KEFLEX) 500 MG capsule   Oral   Take 1 capsule (500 mg total) by mouth 4 (four)  times daily.   8 capsule   0   . cetirizine (ZYRTEC) 10 MG tablet   Oral   Take 10 mg by mouth daily as needed for allergies.         . clonazePAM (KLONOPIN) 2 MG tablet   Oral   Take 2 mg by mouth 3 (three) times daily.         [name] oxycodone (ROXICODONE) 30 MG immediate release tablet   Oral   Take 30 mg by mouth every 8 (eight) hours as needed for pain.         . pregabalin (LYRICA) 300 MG capsule   Oral   Take 300 mg by mouth daily.         . pseudoephedrine (SUDAFED) 60 MG tablet   Oral   Take 60 mg by mouth every 4 (four) hours as needed for congestion.         . ranitidine (ZANTAC) 300 MG tablet   Oral   Take 300 mg by mouth 2 (two) times daily as needed for heartburn.         [name] tiZANidine (ZANAFLEX) 4 MG tablet   Oral   Take 4 mg by mouth every 8 (eight) hours as needed (muscle  relaxation and fibromyalgia).          . traMADol (ULTRAM) 50 MG tablet   Oral   Take 1 tablet (50 mg total) by mouth every 6 (six) hours as needed for pain.   15 tablet   0   . vitamin B-12 (CYANOCOBALAMIN) 100 MCG tablet   Oral   Take 100 mcg by mouth daily.          BP 103/68  Pulse 95  Temp(Src) 98.6 F (37 C) (Oral)  Resp 16  SpO2 99% Physical Exam  Nursing note and vitals reviewed. Constitutional: He is oriented to person, place, and time. He appears well-developed and well-nourished. No distress.  HENT:  Head: Normocephalic and atraumatic.  Right Ear: External ear normal.  Left Ear: External ear normal.  Mouth/Throat: Oropharynx is clear and moist.  No signs of head trauma  Eyes: Conjunctivae and EOM are normal. Pupils are equal, round, and reactive to light.  Neck: Normal range of motion. Neck supple.  No midline tenderness to palpation or step-offs appreciated. Patient has full range of motion without pain.   Cardiovascular: Normal rate, regular rhythm, normal heart sounds and intact distal pulses.   Pulmonary/Chest: Effort normal and breath sounds normal. No stridor. No respiratory distress. He has no wheezes. He has no rales. He exhibits no tenderness.  Abdominal: Soft. He exhibits no mass. There is no tenderness. There is no rebound and no guarding.  Musculoskeletal: Normal range of motion.  Neurological: He is alert and oriented to person, place, and time.  Follows commands, Goal oriented speech, Strength is 5 out of 5x4 extremities, patient ambulates with a coordinated in nonantalgic gait. Sensation is grossly intact.   Skin:  Slowly healing surgical wound to medial right knee, unchanged from evaluation 3 days ago: good granulation tissue, no purulent discharge, induration, blistering  Psychiatric: He has a normal mood and affect. His behavior is normal. Judgment and thought content normal.    ED Course  Procedures (including critical care time) Labs  Review Labs Reviewed - No data to display Imaging Review Dg Knee Complete 4 Views Right  09/17/2013   CLINICAL DATA:  History of tibial plateaus fracture. Recent removal of hardware. Severe left knee pain.  EXAM:  RIGHT KNEE - COMPLETE 4+ VIEW  COMPARISON:  Plain films 08/13/2013.  FINDINGS: No acute bony or joint abnormality is seen. Tracts from prior fixation hardware in the tibia are noted. No retained hardware is identified. Lateral tibial plateau fracture appears well healed. No notable degenerative change.  IMPRESSION: No acute abnormality.  Healed tibial plateau fracture. Status post removal of hardware without evidence of complication.   Electronically Signed   By: Drusilla Kanner M.D.   On: 09/17/2013 10:31    MDM   1. Seizure    Filed Vitals:   09/18/13 1144 09/18/13 1153  BP: 103/65 103/68  Pulse: 95   Temp: 98.6 F (37 C)   TempSrc: Oral   Resp: 20 16  SpO2: 99% 99%     Deaundre Allston is a 42 y.o. male with possible seizure and pain to slowly healing postsurgical incision. Compliant with keflex, no systemic signs. Wound unchanged from 3 days ago. Pt is not compliant with his kepra. Plan blood worse and obs in ED. Pt will not be given pain Rx. Pt was seen 3 days ago when he was found to have stolen all equipment out of the exam room including IV start kit and he also took another patient's discharge scripts for percocet. Pt refused blood work and signed out [employer].   Pt is hemodynamically stable, appropriate for, and amenable to discharge at this time. Pt verbalized understanding and agrees with care plan. All questions answered. Outpatient follow-up and specific return precautions discussed.    Note: Portions of this report may have been transcribed using voice recognition software. Every effort was made to ensure accuracy; however, inadvertent computerized transcription errors may be present     Wynetta Emery, PA-C 09/18/13 1237

## 2013-09-18 NOTE — ED Provider Notes (Signed)
Medical screening examination/treatment/procedure(s) were performed by non-physician practitioner and as supervising physician I was immediately available for consultation/collaboration.    Candyce Churn, MD 09/18/13 [phone]

## 2013-09-24 NOTE — ED Provider Notes (Signed)
Medical screening examination/treatment/procedure(s) were conducted as a shared visit with non-physician practitioner(s) and myself.  I personally evaluated the patient during the encounter.  Pt with wound that appears to be healing well. Stealing hospital supplies while in ED. Escorted out by police.   Raeford Razor, MD 09/24/13 [phone]

## 2013-09-27 ENCOUNTER — Encounter (HOSPITAL_COMMUNITY): Payer: Self-pay | Admitting: Emergency Medicine

## 2013-09-27 ENCOUNTER — Emergency Department (HOSPITAL_COMMUNITY)
Admission: EM | Admit: 2013-09-27 | Discharge: 2013-09-27 | Disposition: A | Discharge status: Home / Self Care | Payer: Medicare Other | Attending: Emergency Medicine | Admitting: Emergency Medicine

## 2013-09-27 DIAGNOSIS — R51 Headache: Secondary | ICD-10-CM | POA: Insufficient documentation

## 2013-09-27 DIAGNOSIS — G40909 Epilepsy, unspecified, not intractable, without status epilepticus: Secondary | ICD-10-CM | POA: Insufficient documentation

## 2013-09-27 DIAGNOSIS — IMO0002 Reserved for concepts with insufficient information to code with codable children: Secondary | ICD-10-CM

## 2013-09-27 DIAGNOSIS — G8918 Other acute postprocedural pain: Secondary | ICD-10-CM | POA: Insufficient documentation

## 2013-09-27 DIAGNOSIS — F410 Panic disorder [episodic paroxysmal anxiety] without agoraphobia: Secondary | ICD-10-CM | POA: Insufficient documentation

## 2013-09-27 DIAGNOSIS — K219 Gastro-esophageal reflux disease without esophagitis: Secondary | ICD-10-CM | POA: Insufficient documentation

## 2013-09-27 DIAGNOSIS — G43909 Migraine, unspecified, not intractable, without status migrainosus: Secondary | ICD-10-CM | POA: Insufficient documentation

## 2013-09-27 DIAGNOSIS — F3289 Other specified depressive episodes: Secondary | ICD-10-CM | POA: Insufficient documentation

## 2013-09-27 DIAGNOSIS — Z792 Long term (current) use of antibiotics: Secondary | ICD-10-CM | POA: Insufficient documentation

## 2013-09-27 DIAGNOSIS — F329 Major depressive disorder, single episode, unspecified: Secondary | ICD-10-CM | POA: Insufficient documentation

## 2013-09-27 DIAGNOSIS — F17200 Nicotine dependence, unspecified, uncomplicated: Secondary | ICD-10-CM | POA: Insufficient documentation

## 2013-09-27 DIAGNOSIS — Z79899 Other long term (current) drug therapy: Secondary | ICD-10-CM | POA: Insufficient documentation

## 2013-09-27 DIAGNOSIS — M25569 Pain in unspecified knee: Secondary | ICD-10-CM | POA: Insufficient documentation

## 2013-09-27 NOTE — ED Notes (Signed)
Pt had surgery on RLL on Aug 20th by Dr. Lajoyce Corners. Apparently got a postop infection which Dr. Lajoyce Corners has been treating. Pt here today saying "I don't want to go back to him.he just keeps giving me antibiotics'. Pt has an open, draining wound on right knee.

## 2013-09-27 NOTE — ED Provider Notes (Signed)
CSN: [id number]     Arrival date & time 09/27/13  1237 History  This chart was scribed for Isaias Sakai, NP working with Gavin Pound. Oletta Lamas, MD by Carl Best, ED Scribe. This patient was seen in room TR07C/TR07C and the patient's care was started at 1:54 PM.    Chief Complaint  Patient presents with  . Leg Pain    The history is provided by the patient.   HPI Comments: [name] Mcdonald is a 42 y.o. Male with a history of knee surgery at end of August who presents to the Emergency Department complaining of persistent right knee pain with non-healing open wound ever since.  Pain worse with palpation and ambulation and not relieved by OTC medications. Followed by Dr. Lajoyce Corners, last treated with doxycycline which he did not complete due to persistent N/V with dosing. Denies new injury, fever, chills, red streaking, N/V, decreased ROM.  Past Medical History  Diagnosis Date  . Depression   . GERD (gastroesophageal reflux disease)   . Migraine   . Ulcer     hx of stomach ulcer  . History of meningitis 2000  . 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  . Seizure     has had recent seizures ,off medication  . Shortness of breath     uses inhalers ,with exertion  . Panic attack   . Restless leg syndrome   . Fibromyalgia    Past Surgical History  Procedure Laterality Date  . Brain surgery  2000    Removed growth?  . Orif tibia plateau  12/13/2011    Procedure: OPEN REDUCTION INTERNAL FIXATION (ORIF) TIBIAL PLATEAU;  Surgeon: Shelda Pal;  Location: [facility];  Service: Orthopedics;  Laterality: Right;  . Mastoidectomy Right 1/1/ 2000  . Arthroscopic knee    . Hardware removal Right 08/01/2013    Procedure: HARDWARE REMOVAL, right tibial plateau;  Surgeon: Nadara Mustard, MD;  Location: [facility];  Service: Orthopedics;  Laterality: Right;  Removal Deep Retained Hardware Right Knee   Family History  Problem Relation Age of Onset  . Arthritis Mother   . COPD Mother    . Emphysema Mother   . Heart disease Mother   . Depression Father   . Cirrhosis Father   . Diabetes Maternal Aunt   . Diabetes Maternal Uncle    History  Substance Use Topics  . Smoking status: Current Every Day Smoker -- 0.50 packs/day for 15 years    Types: Cigarettes  . Smokeless tobacco: Never Used  . Alcohol Use: No    Review of Systems  Constitutional: Negative for fever and chills.  HENT: Negative for ear pain and sore throat.   Eyes: Negative for pain.  Respiratory: Negative for cough and shortness of breath.   Gastrointestinal: Negative for nausea and vomiting.  Genitourinary: Negative for dysuria and urgency.  Musculoskeletal: Positive for arthralgias (right leg pain) and gait problem.  Skin: Positive for wound (right knee).  Neurological: Positive for headaches. Negative for dizziness.  Psychiatric/Behavioral: The patient is not nervous/anxious.     Allergies  Bee venom; Doxycycline; Neurontin; Paroxetine hcl; Phenobarbital; Phenytoin; Tegretol; Topamax; and Prednisone  Home Medications   Current Outpatient Rx  Name  Route  Sig  Dispense  Refill  . albuterol (PROVENTIL HFA;VENTOLIN HFA) 108 (90 BASE) MCG/ACT inhaler   Inhalation   Inhale 2 puffs into the lungs every 6 (six) hours as needed for wheezing.         [name]  amphetamine-dextroamphetamine (ADDERALL) 30 MG tablet   Oral   Take 30 mg by mouth daily as needed (when he feels hyper active.).          [name] B Complex-C (SUPER B COMPLEX PO)   Oral   Take 1 tablet by mouth daily.         . cetirizine (ZYRTEC) 10 MG tablet   Oral   Take 10 mg by mouth daily as needed for allergies.         . clonazePAM (KLONOPIN) 2 MG tablet   Oral   Take 2 mg by mouth 3 (three) times daily.         . pregabalin (LYRICA) 300 MG capsule   Oral   Take 300 mg by mouth daily.         . ranitidine (ZANTAC) 300 MG tablet   Oral   Take 300 mg by mouth 2 (two) times daily as needed for heartburn.         [name]  tiZANidine (ZANAFLEX) 4 MG tablet   Oral   Take 4 mg by mouth every 8 (eight) hours as needed (muscle relaxation and fibromyalgia).          . traMADol (ULTRAM) 50 MG tablet   Oral   Take 1 tablet (50 mg total) by mouth every 6 (six) hours as needed for pain.   15 tablet   0   . vitamin B-12 (CYANOCOBALAMIN) 100 MCG tablet   Oral   Take 100 mcg by mouth daily.         . cephALEXin (KEFLEX) 500 MG capsule   Oral   Take 1 capsule (500 mg total) by mouth 4 (four) times daily.   8 capsule   0    Triage Vitals: BP 113/75  Pulse 87  Temp(Src) 98 F (36.7 C) (Oral)  Resp 16  SpO2 98%  Physical Exam  Nursing note and vitals reviewed. Constitutional: He is oriented to person, place, and time. He appears well-developed and well-nourished.  HENT:  Head: Normocephalic and atraumatic.  Eyes: EOM are normal.  Neck: Normal range of motion.  Pulmonary/Chest: Effort normal and breath sounds normal.  Abdominal: Soft. There is no tenderness.  Musculoskeletal: Normal range of motion.       Legs: Neurological: He is alert and oriented to person, place, and time.  Skin: Skin is warm and dry.  Psychiatric: He has a normal mood and affect.    ED Course  Procedures (including critical care time)  DIAGNOSTIC STUDIES: Oxygen Saturation is 98% on room air, normal by my interpretation.    COORDINATION OF CARE: 1:57 PM- 42 yo patient s/p knee surgery with persistent open wound and right knee pain. No new injury. Wound is open, no fluctuance, no purulent d/c. No evidence of abscess/cellulitis at this time. FROM of knee noted, doubt septic joint. No re-imaging indicated at this time. Spoke directly to Dr. Lajoyce Corners who states that he called in script for bactrim for patient today and has just recently prescribed pain medications. Discussed potential referral to wound care and Dr. Lajoyce Corners agreed, will provide contact information for wound care at [facility] for patient. Recommend that patient fill script for  bactrim, continue current pain medication regimen per Dr. Lajoyce Corners and arrange f/u with Dr. Lajoyce Corners and wound care next week. No scripts for additional pain medications will be provided today. Infection warnings discussed and provided to the patient in writing at time of d/c.  Labs Review Labs Reviewed - No  data to display Imaging Review No results found.  EKG Interpretation   None       MDM   1. Chronic wound of extremity    I personally performed the services described in this documentation, which was scribed in my presence. The recorded information has been reviewed and is accurate.    Simmie Davies, NP 09/27/13 [phone]

## 2013-09-27 NOTE — ED Notes (Signed)
Pt in via EMS to triage- per EMS, Pt in c/o right leg pain below his knee, history of same, seen for same in the past, no new injury, pt ambulatory for EMS

## 2013-09-28 ENCOUNTER — Encounter (HOSPITAL_COMMUNITY): Payer: Self-pay | Admitting: Emergency Medicine

## 2013-09-28 ENCOUNTER — Emergency Department (HOSPITAL_COMMUNITY)
Admission: EM | Admit: 2013-09-28 | Discharge: 2013-09-28 | Disposition: A | Discharge status: Home / Self Care | Payer: Medicare Other | Attending: Emergency Medicine | Admitting: Emergency Medicine

## 2013-09-28 DIAGNOSIS — IMO0001 Reserved for inherently not codable concepts without codable children: Secondary | ICD-10-CM | POA: Insufficient documentation

## 2013-09-28 DIAGNOSIS — K259 Gastric ulcer, unspecified as acute or chronic, without hemorrhage or perforation: Secondary | ICD-10-CM | POA: Insufficient documentation

## 2013-09-28 DIAGNOSIS — Z8669 Personal history of other diseases of the nervous system and sense organs: Secondary | ICD-10-CM | POA: Insufficient documentation

## 2013-09-28 DIAGNOSIS — Y838 Other surgical procedures as the cause of abnormal reaction of the patient, or of later complication, without mention of misadventure at the time of the procedure: Secondary | ICD-10-CM | POA: Insufficient documentation

## 2013-09-28 DIAGNOSIS — Z8661 Personal history of infections of the central nervous system: Secondary | ICD-10-CM | POA: Insufficient documentation

## 2013-09-28 DIAGNOSIS — F410 Panic disorder [episodic paroxysmal anxiety] without agoraphobia: Secondary | ICD-10-CM | POA: Insufficient documentation

## 2013-09-28 DIAGNOSIS — F3289 Other specified depressive episodes: Secondary | ICD-10-CM | POA: Insufficient documentation

## 2013-09-28 DIAGNOSIS — T8189XA Other complications of procedures, not elsewhere classified, initial encounter: Secondary | ICD-10-CM | POA: Insufficient documentation

## 2013-09-28 DIAGNOSIS — F329 Major depressive disorder, single episode, unspecified: Secondary | ICD-10-CM | POA: Insufficient documentation

## 2013-09-28 DIAGNOSIS — F17200 Nicotine dependence, unspecified, uncomplicated: Secondary | ICD-10-CM | POA: Insufficient documentation

## 2013-09-28 DIAGNOSIS — K219 Gastro-esophageal reflux disease without esophagitis: Secondary | ICD-10-CM | POA: Insufficient documentation

## 2013-09-28 DIAGNOSIS — G43909 Migraine, unspecified, not intractable, without status migrainosus: Secondary | ICD-10-CM | POA: Insufficient documentation

## 2013-09-28 DIAGNOSIS — G40909 Epilepsy, unspecified, not intractable, without status epilepticus: Secondary | ICD-10-CM | POA: Insufficient documentation

## 2013-09-28 NOTE — ED Provider Notes (Signed)
CSN: [id number]     Arrival date & time 09/28/13  0200 History   First MD Initiated Contact with Patient 09/28/13 [phone]     Chief Complaint  Patient presents with  . Leg Pain  . Wound Check   (Consider location/radiation/quality/duration/timing/severity/associated sxs/prior Treatment) Patient is a 42 y.o. male presenting with leg pain and wound check. The history is provided by the patient.  Leg Pain Wound Check   patient complaining of worsening pain to his right lower extremity from a prior knee surgery. Seen here multiple times for similar symptoms including today and has followup with orthopedics as well as placed on antibiotics. He has multiple ED visits related to request narcotic pain medication. Have seen the patient myself for similar symptoms. He states he is frustrated about the non-healing wound and is demanding pain meds  Past Medical History  Diagnosis Date  . Depression   . GERD (gastroesophageal reflux disease)   . Migraine   . Ulcer     hx of stomach ulcer  . History of meningitis 2000  . 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  . Seizure     has had recent seizures ,off medication  . Shortness of breath     uses inhalers ,with exertion  . Panic attack   . Restless leg syndrome   . Fibromyalgia    Past Surgical History  Procedure Laterality Date  . Brain surgery  2000    Removed growth?  . Orif tibia plateau  12/13/2011    Procedure: OPEN REDUCTION INTERNAL FIXATION (ORIF) TIBIAL PLATEAU;  Surgeon: Shelda Pal;  Location: [facility];  Service: Orthopedics;  Laterality: Right;  . Mastoidectomy Right 1/1/ 2000  . Arthroscopic knee    . Hardware removal Right 08/01/2013    Procedure: HARDWARE REMOVAL, right tibial plateau;  Surgeon: Nadara Mustard, MD;  Location: [facility];  Service: Orthopedics;  Laterality: Right;  Removal Deep Retained Hardware Right Knee   Family History  Problem Relation Age of Onset  . Arthritis Mother   .  COPD Mother   . Emphysema Mother   . Heart disease Mother   . Depression Father   . Cirrhosis Father   . Diabetes Maternal Aunt   . Diabetes Maternal Uncle    History  Substance Use Topics  . Smoking status: Current Every Day Smoker -- 0.50 packs/day for 15 years    Types: Cigarettes  . Smokeless tobacco: Never Used  . Alcohol Use: No    Review of Systems  All other systems reviewed and are negative.    Allergies  Bee venom; Doxycycline; Neurontin; Paroxetine hcl; Phenobarbital; Phenytoin; Tegretol; Topamax; and Prednisone  Home Medications   Current Outpatient Rx  Name  Route  Sig  Dispense  Refill  . albuterol (PROVENTIL HFA;VENTOLIN HFA) 108 (90 BASE) MCG/ACT inhaler   Inhalation   Inhale 2 puffs into the lungs every 6 (six) hours as needed for wheezing.         [name] amphetamine-dextroamphetamine (ADDERALL) 30 MG tablet   Oral   Take 30 mg by mouth daily as needed (when he feels hyper active.).          [name] B Complex-C (SUPER B COMPLEX PO)   Oral   Take 1 tablet by mouth daily.         . cetirizine (ZYRTEC) 10 MG tablet   Oral   Take 10 mg by mouth daily as needed for allergies.         [name]  clonazePAM (KLONOPIN) 2 MG tablet   Oral   Take 2 mg by mouth 3 (three) times daily.         . pregabalin (LYRICA) 300 MG capsule   Oral   Take 300 mg by mouth daily.         . ranitidine (ZANTAC) 300 MG tablet   Oral   Take 300 mg by mouth 2 (two) times daily as needed for heartburn.         . sulfamethoxazole-trimethoprim (BACTRIM DS) 800-160 MG per tablet   Oral   Take 1 tablet by mouth 2 (two) times daily.         [name] tiZANidine (ZANAFLEX) 4 MG tablet   Oral   Take 4 mg by mouth every 8 (eight) hours as needed (muscle relaxation and fibromyalgia).          . traMADol (ULTRAM) 50 MG tablet   Oral   Take 1 tablet (50 mg total) by mouth every 6 (six) hours as needed for pain.   15 tablet   0   . vitamin B-12 (CYANOCOBALAMIN) 100 MCG tablet    Oral   Take 100 mcg by mouth daily.          BP 118/95  Pulse 94  Temp(Src) 97.9 F (36.6 C)  Resp 18  SpO2 100% Physical Exam  Musculoskeletal:       Legs:   ED Course  Procedures (including critical care time) Labs Review Labs Reviewed - No data to display Imaging Review No results found.  EKG Interpretation   None       MDM  No diagnosis found. I informed patient he would not be receiving any narcotic medications here and that he needs to follow Dr. Lajoyce Corners his orthopedic surgeon and take his medications as directed   Toy Baker, MD 09/28/13 0236

## 2013-09-28 NOTE — ED Notes (Signed)
Pt presents with concerns of his medial right knee surgical wound not healing.  The wound is approx 1.5cm long, appears to be healing by second intention.  Has been seeing his ortho physician, was given antibiotics but no pain medications.  Reports he needs pain medications.

## 2013-09-28 NOTE — ED Notes (Signed)
Pt upset he is not getting pain medication.  Is making threats re: "I need your name to give to my lawyer."  Attempted to explain the course of healing he is going through and the fact that the physician has not given me orders for medications.  Pt unwilling to listen to rationale.

## 2013-09-28 NOTE — ED Notes (Signed)
Rt. Leg pain from "tibia surgery." there is open wound to rt. Knee, medial. Off/on antibiotics. Taking sulfa. Was taking oxycodone but ran out. Been taking bc goody powders.

## 2013-10-02 NOTE — ED Provider Notes (Signed)
Medical screening examination/treatment/procedure(s) were performed by non-physician practitioner and as supervising physician I was immediately available for consultation/collaboration.  Gavin Pound. Aisa Schoeppner, MD 10/02/13 0130

## 2013-10-15 ENCOUNTER — Ambulatory Visit: Payer: Medicare Other | Admitting: Sports Medicine

## 2014-01-14 ENCOUNTER — Emergency Department (HOSPITAL_COMMUNITY)
Admission: EM | Admit: 2014-01-14 | Discharge: 2014-01-14 | Disposition: A | Discharge status: Home / Self Care | Payer: Medicare Other | Attending: Emergency Medicine | Admitting: Emergency Medicine

## 2014-01-14 ENCOUNTER — Encounter (HOSPITAL_COMMUNITY): Payer: Self-pay | Admitting: Emergency Medicine

## 2014-01-14 ENCOUNTER — Emergency Department (HOSPITAL_COMMUNITY): Payer: Medicare Other

## 2014-01-14 DIAGNOSIS — G2581 Restless legs syndrome: Secondary | ICD-10-CM | POA: Insufficient documentation

## 2014-01-14 DIAGNOSIS — J159 Unspecified bacterial pneumonia: Secondary | ICD-10-CM | POA: Insufficient documentation

## 2014-01-14 DIAGNOSIS — R0789 Other chest pain: Secondary | ICD-10-CM | POA: Insufficient documentation

## 2014-01-14 DIAGNOSIS — K219 Gastro-esophageal reflux disease without esophagitis: Secondary | ICD-10-CM | POA: Insufficient documentation

## 2014-01-14 DIAGNOSIS — J029 Acute pharyngitis, unspecified: Secondary | ICD-10-CM | POA: Insufficient documentation

## 2014-01-14 DIAGNOSIS — G40909 Epilepsy, unspecified, not intractable, without status epilepticus: Secondary | ICD-10-CM | POA: Insufficient documentation

## 2014-01-14 DIAGNOSIS — Z79899 Other long term (current) drug therapy: Secondary | ICD-10-CM | POA: Insufficient documentation

## 2014-01-14 DIAGNOSIS — Z792 Long term (current) use of antibiotics: Secondary | ICD-10-CM | POA: Insufficient documentation

## 2014-01-14 DIAGNOSIS — G43909 Migraine, unspecified, not intractable, without status migrainosus: Secondary | ICD-10-CM | POA: Insufficient documentation

## 2014-01-14 DIAGNOSIS — J189 Pneumonia, unspecified organism: Secondary | ICD-10-CM

## 2014-01-14 DIAGNOSIS — R51 Headache: Secondary | ICD-10-CM | POA: Insufficient documentation

## 2014-01-14 DIAGNOSIS — F410 Panic disorder [episodic paroxysmal anxiety] without agoraphobia: Secondary | ICD-10-CM | POA: Insufficient documentation

## 2014-01-14 DIAGNOSIS — IMO0001 Reserved for inherently not codable concepts without codable children: Secondary | ICD-10-CM | POA: Insufficient documentation

## 2014-01-14 DIAGNOSIS — F17200 Nicotine dependence, unspecified, uncomplicated: Secondary | ICD-10-CM | POA: Insufficient documentation

## 2014-01-14 LAB — CBC WITH DIFFERENTIAL/PLATELET
BASOS ABS: 0 10*3/uL (ref 0.0–0.1)
BASOS PCT: 0 % (ref 0–1)
Eosinophils Absolute: 0.2 10*3/uL (ref 0.0–0.7)
Eosinophils Relative: 1 % (ref 0–5)
HCT: 40.2 % (ref 39.0–52.0)
Hemoglobin: 14.2 g/dL (ref 13.0–17.0)
Lymphocytes Relative: 9 % — ABNORMAL LOW (ref 12–46)
Lymphs Abs: 1.7 10*3/uL (ref 0.7–4.0)
MCH: 32.6 pg (ref 26.0–34.0)
MCHC: 35.3 g/dL (ref 30.0–36.0)
MCV: 92.4 fL (ref 78.0–100.0)
Monocytes Absolute: 0.9 10*3/uL (ref 0.1–1.0)
Monocytes Relative: 5 % (ref 3–12)
NEUTROS PCT: 84 % — AB (ref 43–77)
Neutro Abs: 15.2 10*3/uL — ABNORMAL HIGH (ref 1.7–7.7)
PLATELETS: 271 10*3/uL (ref 150–400)
RBC: 4.35 MIL/uL (ref 4.22–5.81)
RDW: 14.5 % (ref 11.5–15.5)
WBC: 18 10*3/uL — ABNORMAL HIGH (ref 4.0–10.5)

## 2014-01-14 LAB — COMPREHENSIVE METABOLIC PANEL
ALT: 17 U/L (ref 0–53)
AST: 24 U/L (ref 0–37)
Albumin: 3.4 g/dL — ABNORMAL LOW (ref 3.5–5.2)
Alkaline Phosphatase: 86 U/L (ref 39–117)
BUN: 17 mg/dL (ref 6–23)
CO2: 14 meq/L — AB (ref 19–32)
CREATININE: 0.93 mg/dL (ref 0.50–1.35)
Calcium: 8.1 mg/dL — ABNORMAL LOW (ref 8.4–10.5)
Chloride: 109 mEq/L (ref 96–112)
Glucose, Bld: 93 mg/dL (ref 70–99)
POTASSIUM: 3.3 meq/L — AB (ref 3.7–5.3)
SODIUM: 143 meq/L (ref 137–147)
Total Bilirubin: <0.2 mg/dL — ABNORMAL LOW (ref 0.3–1.2)
Total Protein: 6.6 g/dL (ref 6.0–8.3)

## 2014-01-14 LAB — TROPONIN I

## 2014-01-14 MED ORDER — ACETAMINOPHEN 500 MG PO TABS
1000.0000 mg | ORAL_TABLET | Freq: Once | ORAL | Status: AC
  Administered 2014-01-14: 1000 mg via ORAL
  Filled 2014-01-14: qty 2

## 2014-01-14 MED ORDER — KETOROLAC TROMETHAMINE 60 MG/2ML IM SOLN
60.0000 mg | Freq: Once | INTRAMUSCULAR | Status: AC
  Administered 2014-01-14: 60 mg via INTRAMUSCULAR
  Filled 2014-01-14: qty 2

## 2014-01-14 MED ORDER — AZITHROMYCIN 500 MG IV SOLR
500.0000 mg | Freq: Once | INTRAVENOUS | Status: AC
  Administered 2014-01-14: 500 mg via INTRAVENOUS

## 2014-01-14 MED ORDER — AZITHROMYCIN 250 MG PO TABS: 250.0000 mg | ORAL_TABLET | Freq: Every day | ORAL | Status: AC

## 2014-01-14 MED ORDER — SODIUM CHLORIDE 0.9 % IV SOLN
Freq: Once | INTRAVENOUS | Status: AC
  Administered 2014-01-14: 14:00:00 via INTRAVENOUS

## 2014-01-14 MED ORDER — HYDROCOD POLST-CHLORPHEN POLST 10-8 MG/5ML PO LQCR: 5.0000 mL | Freq: Two times a day (BID) | ORAL | Status: AC | PRN

## 2014-01-14 NOTE — ED Provider Notes (Signed)
CSN: [id number]     Arrival date & time 01/14/14  1039 History   First MD Initiated Contact with Patient 01/14/14 1045     Chief Complaint  Patient presents with  . Shortness of Breath  . Cough  . Sore Throat  . Headache   (Consider location/radiation/quality/duration/timing/severity/associated sxs/prior Treatment) HPI Comments: Patient is a 43 year old male with past medical history of seizures, migraines, and fibromyalgia. He presents today with complaints of fever, body aches, congestion, productive cough, and tightness in the chest for the past 4 or 5 days. States that his mother was recently ill in the hospital for a COPD exacerbation. He also reports he has been having chest discomfort off and on for the past month. Has no prior history of cardiac disease  Patient is a 43 y.o. male presenting with shortness of breath. The history is provided by the patient.  Shortness of Breath Severity:  Moderate Onset quality:  Gradual Duration:  5 days Timing:  Constant Progression:  Worsening Chronicity:  New Context: activity and URI   Relieved by:  Nothing Worsened by:  Nothing tried Ineffective treatments:  None tried   Past Medical History  Diagnosis Date  . Depression   . GERD (gastroesophageal reflux disease)   . Migraine   . Ulcer     hx of stomach ulcer  . History of meningitis 2000  . 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  . Seizure     has had recent seizures ,off medication  . Shortness of breath     uses inhalers ,with exertion  . Panic attack   . Restless leg syndrome   . Fibromyalgia    Past Surgical History  Procedure Laterality Date  . Brain surgery  2000    Removed growth?  . Orif tibia plateau  12/13/2011    Procedure: OPEN REDUCTION INTERNAL FIXATION (ORIF) TIBIAL PLATEAU;  Surgeon: Shelda Pal;  Location: [facility];  Service: Orthopedics;  Laterality: Right;  . Mastoidectomy Right 1/1/ 2000  . Arthroscopic knee    .  Hardware removal Right 08/01/2013    Procedure: HARDWARE REMOVAL, right tibial plateau;  Surgeon: Nadara Mustard, MD;  Location: [facility] OR;  Service: Orthopedics;  Laterality: Right;  Removal Deep Retained Hardware Right Knee   Family History  Problem Relation Age of Onset  . Arthritis Mother   . COPD Mother   . Emphysema Mother   . Heart disease Mother   . Depression Father   . Cirrhosis Father   . Diabetes Maternal Aunt   . Diabetes Maternal Uncle    History  Substance Use Topics  . Smoking status: Current Every Day Smoker -- 0.50 packs/day for 15 years    Types: Cigarettes  . Smokeless tobacco: Never Used  . Alcohol Use: No    Review of Systems  Respiratory: Positive for shortness of breath.   All other systems reviewed and are negative.    Allergies  Bee venom; Doxycycline; Neurontin; Paroxetine hcl; Phenobarbital; Phenytoin; Tegretol; Topamax; and Prednisone  Home Medications   Current Outpatient Rx  Name  Route  Sig  Dispense  Refill  . albuterol (PROVENTIL HFA;VENTOLIN HFA) 108 (90 BASE) MCG/ACT inhaler   Inhalation   Inhale 2 puffs into the lungs every 6 (six) hours as needed for wheezing.         [name] amphetamine-dextroamphetamine (ADDERALL) 30 MG tablet   Oral   Take 30 mg by mouth daily as needed (when  he feels hyper active.).          [name]. Aspirin-Acetaminophen-Caffeine (GOODY HEADACHE PO)   Oral   Take 30 packets by mouth every 2 (two) hours as needed (Pt claims he took 30 packets trying to stop pain in leg.).         [name]. B Complex-C (SUPER B COMPLEX PO)   Oral   Take 1 tablet by mouth daily.         . cetirizine (ZYRTEC) 10 MG tablet   Oral   Take 10 mg by mouth daily as needed for allergies.         . clonazePAM (KLONOPIN) 2 MG tablet   Oral   Take 2 mg by mouth 3 (three) times daily.         . pregabalin (LYRICA) 300 MG capsule   Oral   Take 300 mg by mouth daily.         . ranitidine (ZANTAC) 300 MG tablet   Oral   Take 300 mg by mouth  2 (two) times daily as needed for heartburn.         . sulfamethoxazole-trimethoprim (BACTRIM DS) 800-160 MG per tablet   Oral   Take 1 tablet by mouth 2 (two) times daily.         [name]. tiZANidine (ZANAFLEX) 4 MG tablet   Oral   Take 4 mg by mouth every 8 (eight) hours as needed (muscle relaxation and fibromyalgia).          . vitamin B-12 (CYANOCOBALAMIN) 100 MCG tablet   Oral   Take 100 mcg by mouth daily.          BP 108/71  Temp(Src) 99.9 F (37.7 C) (Oral)  Resp 30  Ht 5\' 4"  (1.626 m)  Wt 140 lb (63.504 kg)  BMI 24.02 kg/m2  SpO2 97% Physical Exam  Nursing note and vitals reviewed. Constitutional: He is oriented to person, place, and time. He appears well-developed and well-nourished. No distress.  Patient is awake and alert. He appears very anxious and is hyperventilating. There is no stridor and he is in no respiratory distress.  HENT:  Head: Normocephalic and atraumatic.  Mouth/Throat: Oropharynx is clear and moist.  Neck: Normal range of motion. Neck supple.  Cardiovascular: Normal rate, regular rhythm and normal heart sounds.   Pulmonary/Chest: Effort normal and breath sounds normal. No respiratory distress. He has no wheezes. He has no rales.  Abdominal: Soft. Bowel sounds are normal. He exhibits no distension. There is no tenderness.  Musculoskeletal: Normal range of motion. He exhibits no edema.  Lymphadenopathy:    He has no cervical adenopathy.  Neurological: He is alert and oriented to person, place, and time.  Skin: Skin is warm and dry. He is not diaphoretic.    ED Course  Procedures (including critical care time) Labs Review Labs Reviewed  CBC WITH DIFFERENTIAL  COMPREHENSIVE METABOLIC PANEL  TROPONIN I   Imaging Review No results found.  EKG Interpretation    Date/Time:  Monday January 14 2014 10:53:00 EST Ventricular Rate:  102 PR Interval:  133 QRS Duration: 88 QT Interval:  323 QTC Calculation: 421 R Axis:   14 Text  Interpretation:  Sinus tachycardia Confirmed by DELOS  MD, Maricela Kawahara ([id number]) on 01/14/2014 11:15:05 AM            MDM  No diagnosis found. Patient is a 43 year old male who presents with chest congestion and cough. Workup reveals a right sided, patchy infiltrate that appears  to be an atypical pneumonia. He has a leukocytosis, but no hypoxia. He is also noted to be somewhat acidotic with a CO2 of 14. I discussed the results of these tests with the patient and actually suggested that he be admitted. He did not want to do this and requested to go home. I reluctantly agreed to allow this under the stipulation that he be given IV fluids and the first dose of antibiotics by IV. I also instructed him that he needs to return should his symptoms worsen or change.      Geoffery Lyons, MD 01/14/14 1450

## 2014-01-14 NOTE — Discharge Instructions (Signed)
Zithromax as prescribed.  Tussionex as prescribed as needed for cough.  Return to the emergency department if you develop severe chest pain, difficulty breathing, or other new or concerning symptoms.   Pneumonia, Adult Pneumonia is an infection of the lungs.  CAUSES Pneumonia may be caused by bacteria or a virus. Usually, these infections are caused by breathing infectious particles into the lungs (respiratory tract). SYMPTOMS   Cough.  Fever.  Chest pain.  Increased rate of breathing.  Wheezing.  Mucus production. DIAGNOSIS  If you have the common symptoms of pneumonia, your caregiver will typically confirm the diagnosis with a chest X-ray. The X-ray will show an abnormality in the lung (pulmonary infiltrate) if you have pneumonia. Other tests of your blood, urine, or sputum may be done to find the specific cause of your pneumonia. Your caregiver may also do tests (blood gases or pulse oximetry) to see how well your lungs are working. TREATMENT  Some forms of pneumonia may be spread to other people when you cough or sneeze. You may be asked to wear a mask before and during your exam. Pneumonia that is caused by bacteria is treated with antibiotic medicine. Pneumonia that is caused by the influenza virus may be treated with an antiviral medicine. Most other viral infections must run their course. These infections will not respond to antibiotics.  PREVENTION A pneumococcal shot (vaccine) is available to prevent a common bacterial cause of pneumonia. This is usually suggested for:  People over 41 years old.  Patients on chemotherapy.  People with chronic lung problems, such as bronchitis or emphysema.  People with immune system problems. If you are over 65 or have a high risk condition, you may receive the pneumococcal vaccine if you have not received it before. In some countries, a routine influenza vaccine is also recommended. This vaccine can help prevent some cases of  pneumonia.You may be offered the influenza vaccine as part of your care. If you smoke, it is time to quit. You may receive instructions on how to stop smoking. Your caregiver can provide medicines and counseling to help you quit. HOME CARE INSTRUCTIONS   Cough suppressants may be used if you are losing too much rest. However, coughing protects you by clearing your lungs. You should avoid using cough suppressants if you can.  Your caregiver may have prescribed medicine if he or she thinks your pneumonia is caused by a bacteria or influenza. Finish your medicine even if you start to feel better.  Your caregiver may also prescribe an expectorant. This loosens the mucus to be coughed up.  Only take over-the-counter or prescription medicines for pain, discomfort, or fever as directed by your caregiver.  Do not smoke. Smoking is a common cause of bronchitis and can contribute to pneumonia. If you are a smoker and continue to smoke, your cough may last several weeks after your pneumonia has cleared.  A cold steam vaporizer or humidifier in your room or home may help loosen mucus.  Coughing is often worse at night. Sleeping in a semi-upright position in a recliner or using a couple pillows under your head will help with this.  Get rest as you feel it is needed. Your body will usually let you know when you need to rest. SEEK IMMEDIATE MEDICAL CARE IF:   Your illness becomes worse. This is especially true if you are elderly or weakened from any other disease.  You cannot control your cough with suppressants and are losing sleep.  You begin  coughing up blood.  You develop pain which is getting worse or is uncontrolled with medicines.  You have a fever.  Any of the symptoms which initially brought you in for treatment are getting worse rather than better.  You develop shortness of breath or chest pain. MAKE SURE YOU:   Understand these instructions.  Will watch your condition.  Will get  help right away if you are not doing well or get worse. Document Released: 11/29/2005 Document Revised: 02/21/2012 Document Reviewed: 02/18/2011 [facility] Patient Information 2014 [place], [place].

## 2014-01-14 NOTE — ED Notes (Signed)
C/o prod cough, h/a, sore throat, chills x 4-5 days. Reports cough & SOB worse since yesterday. Also c/o left side CP x 1 month

## 2014-02-10 DEATH — deceased

## 2015-03-13 IMAGING — CR DG CERVICAL SPINE COMPLETE 4+V
8 of 9 series · 8 of 9 positions shown · non-contrast
Comparison: None.

CLINICAL DATA: Seizures, fall.  Neck and back pain.

CERVICAL SPINE - COMPLETE 4+ VIEW

[w cervical spine lat (1 of 2)]
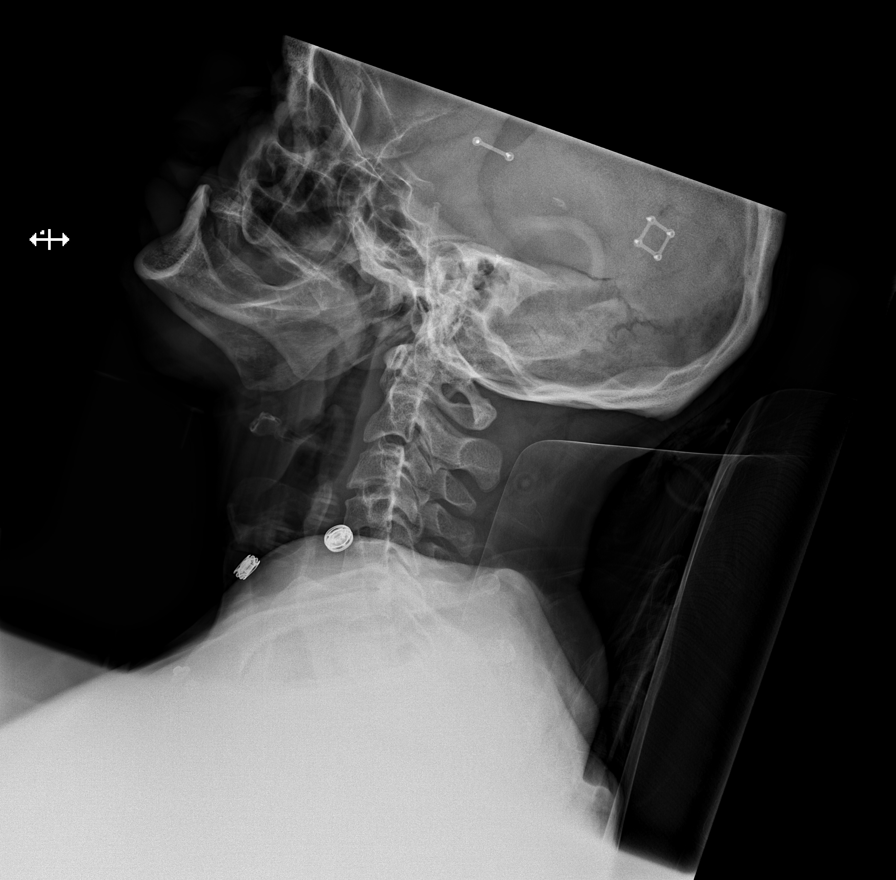

[w cervical spine lat (2 of 2)]
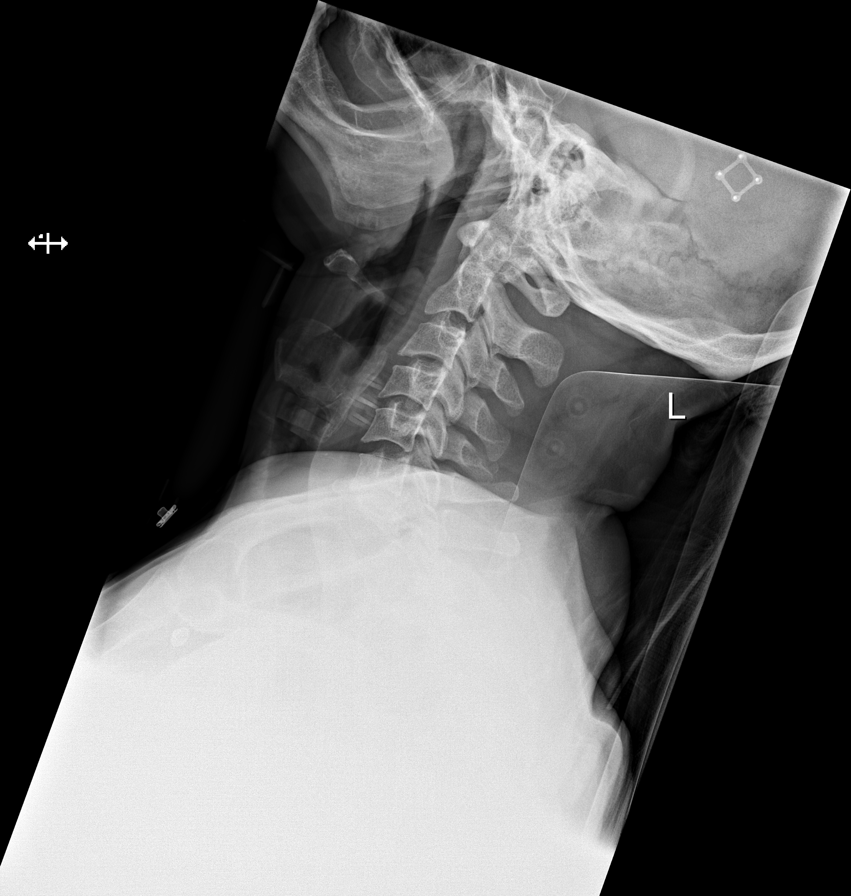

[w cervical swimmers]
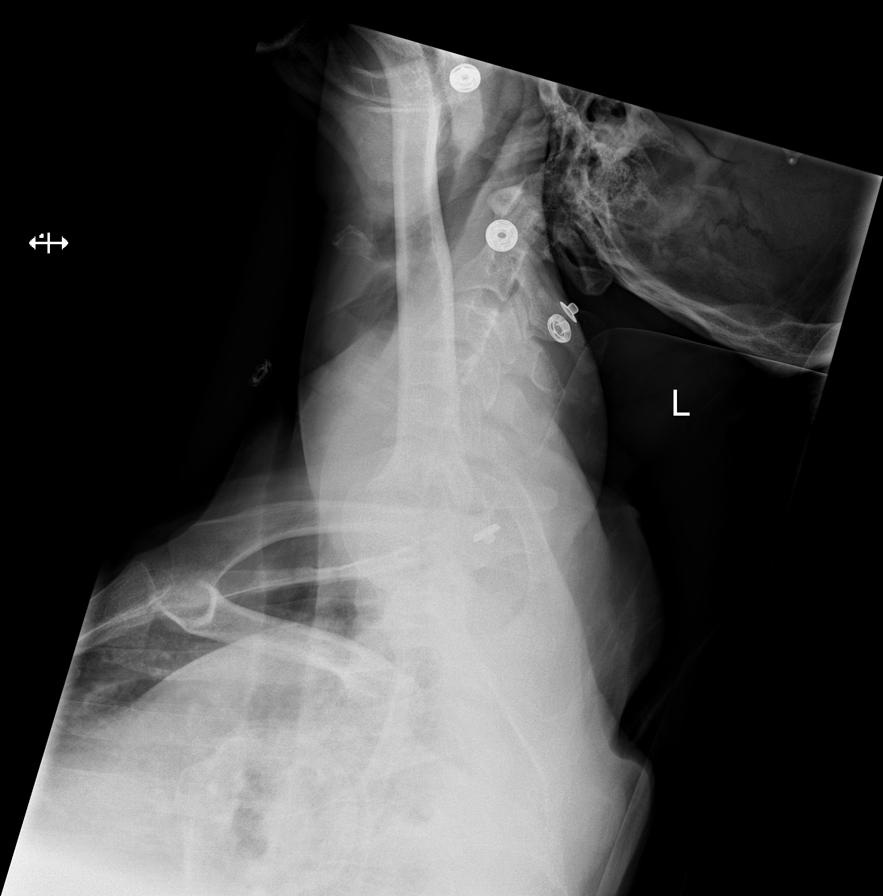

[t cervical spine ap (1 of 2)]
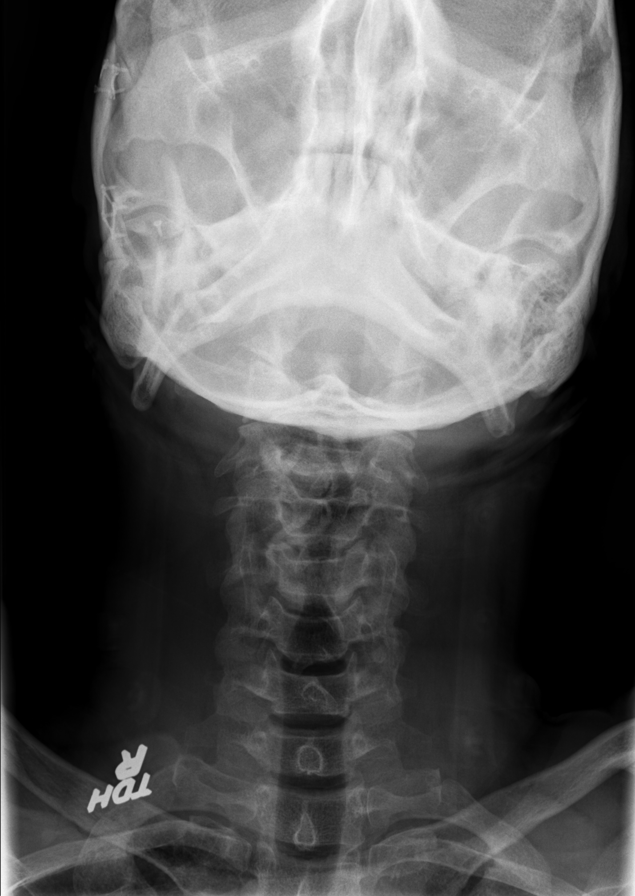

[t cervical spine odontoid]
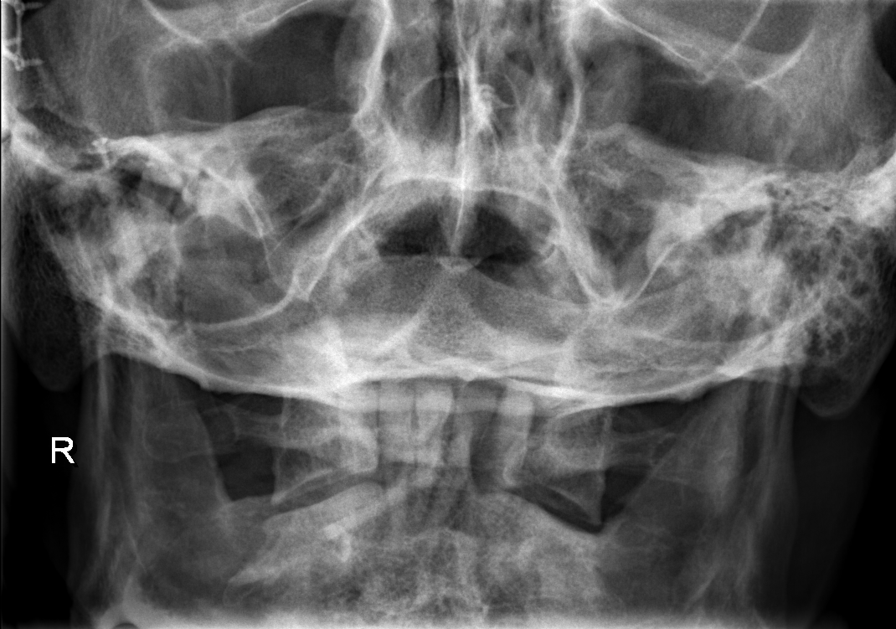

[t cervical spine ap (2 of 2)]
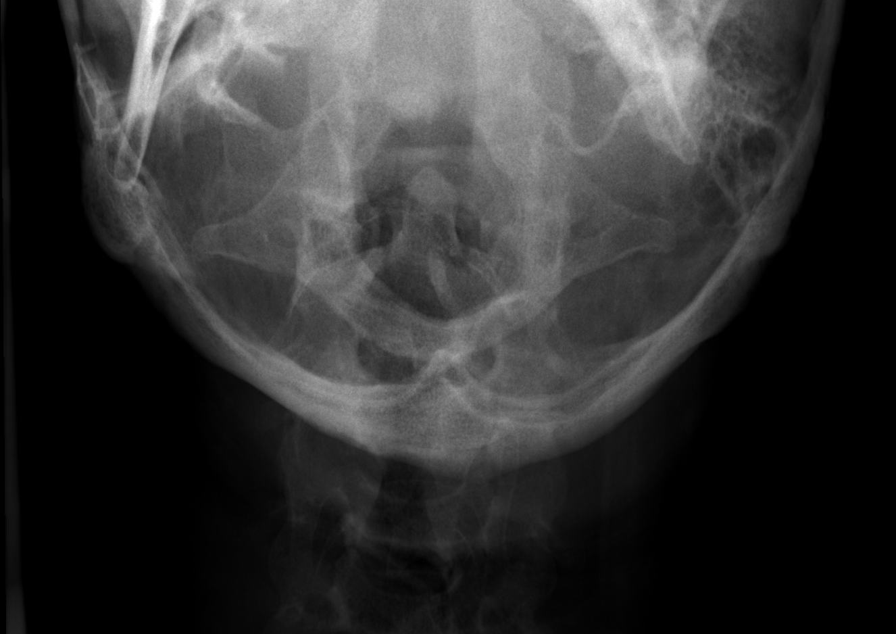

[t cervical spine obl]
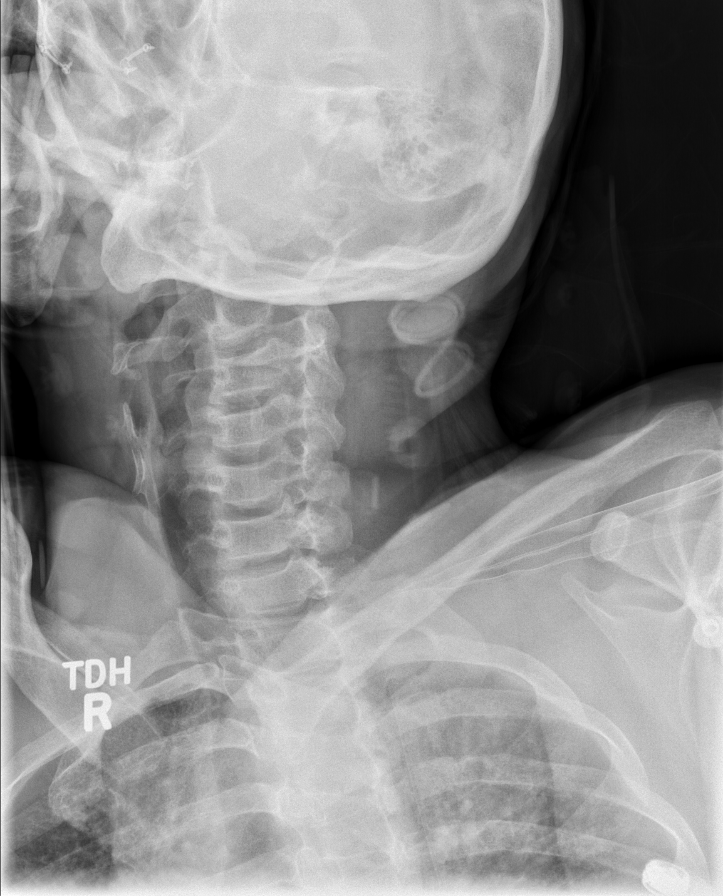

[t cervical swimmers breathing]
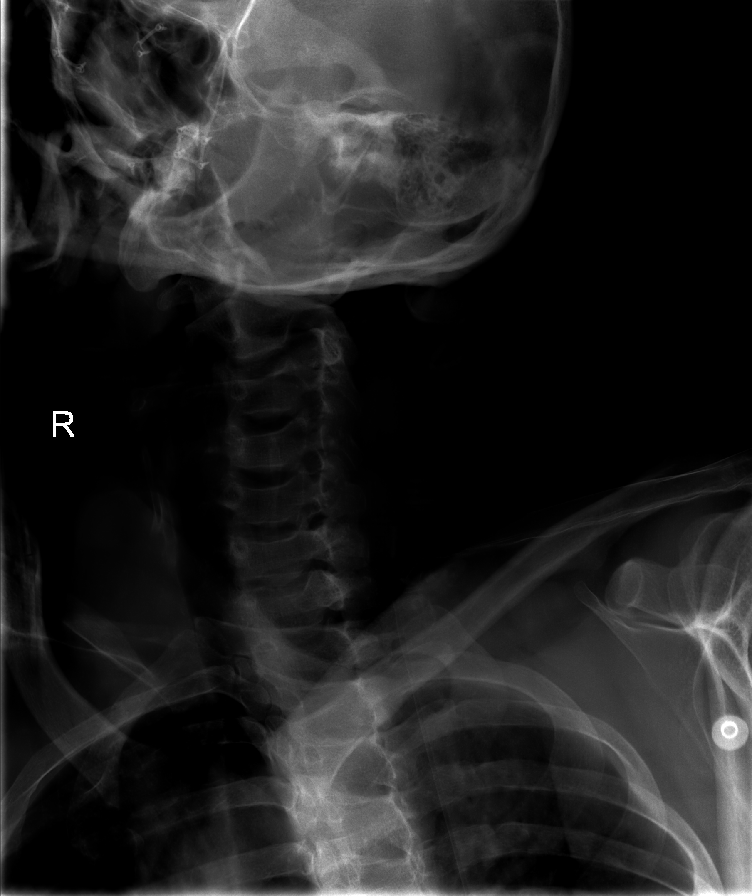

[8 of 9 positions shown; findings below may reference images not displayed]

FINDINGS: Normal alignment.  Disc spaces are maintained.
Prevertebral soft tissues are normal.  No fracture or subluxation.
Changes of prior craniotomy noted.
IMPRESSION: No acute bony abnormality.
